# Patient Record
Sex: Female | Born: 1935 | Race: White | Hispanic: No | Smoking: Never smoker
Health system: Southern US, Community
[De-identification: ages and names within clinical notes are randomized; demographics above are authoritative.]

## PROBLEM LIST (undated history)

## (undated) DIAGNOSIS — I509 Heart failure, unspecified: Secondary | ICD-10-CM

## (undated) DIAGNOSIS — M1990 Unspecified osteoarthritis, unspecified site: Secondary | ICD-10-CM

## (undated) DIAGNOSIS — J449 Chronic obstructive pulmonary disease, unspecified: Secondary | ICD-10-CM

## (undated) DIAGNOSIS — K219 Gastro-esophageal reflux disease without esophagitis: Secondary | ICD-10-CM

## (undated) DIAGNOSIS — I639 Cerebral infarction, unspecified: Secondary | ICD-10-CM

## (undated) DIAGNOSIS — I10 Essential (primary) hypertension: Secondary | ICD-10-CM

## (undated) DIAGNOSIS — I499 Cardiac arrhythmia, unspecified: Secondary | ICD-10-CM

## (undated) DIAGNOSIS — R0602 Shortness of breath: Secondary | ICD-10-CM

## (undated) DIAGNOSIS — E785 Hyperlipidemia, unspecified: Secondary | ICD-10-CM

## (undated) DIAGNOSIS — I4891 Unspecified atrial fibrillation: Secondary | ICD-10-CM

## (undated) DIAGNOSIS — F419 Anxiety disorder, unspecified: Secondary | ICD-10-CM

## (undated) DIAGNOSIS — E039 Hypothyroidism, unspecified: Secondary | ICD-10-CM

## (undated) HISTORY — PX: TUBAL LIGATION: SHX77

## (undated) HISTORY — PX: APPENDECTOMY: SHX54

## (undated) HISTORY — DX: Cerebral infarction, unspecified: I63.9

## (undated) HISTORY — PX: TOTAL ABDOMINAL HYSTERECTOMY: SHX209

## (undated) HISTORY — PX: EYE SURGERY: SHX253

## (undated) HISTORY — PX: TONSILLECTOMY: SUR1361

## (undated) HISTORY — DX: Unspecified atrial fibrillation: I48.91

## (undated) HISTORY — DX: Hyperlipidemia, unspecified: E78.5

## (undated) HISTORY — DX: Hypothyroidism, unspecified: E03.9

## (undated) HISTORY — DX: Essential (primary) hypertension: I10

## (undated) HISTORY — PX: BREAST SURGERY: SHX581

---

## 1998-04-07 ENCOUNTER — Other Ambulatory Visit: Admission: RE | Admit: 1998-04-07 | Discharge: 1998-04-07 | Payer: Self-pay | Admitting: Obstetrics and Gynecology

## 1998-04-18 ENCOUNTER — Ambulatory Visit: Admission: RE | Admit: 1998-04-18 | Discharge: 1998-04-18 | Payer: Self-pay | Admitting: Gynecology

## 1998-07-30 ENCOUNTER — Emergency Department (HOSPITAL_COMMUNITY): Admission: EM | Admit: 1998-07-30 | Discharge: 1998-07-30 | Payer: Self-pay

## 1998-08-04 ENCOUNTER — Encounter: Payer: Self-pay | Admitting: Emergency Medicine

## 1998-08-04 ENCOUNTER — Emergency Department (HOSPITAL_COMMUNITY): Admission: EM | Admit: 1998-08-04 | Discharge: 1998-08-04 | Payer: Self-pay | Admitting: Emergency Medicine

## 1999-09-26 ENCOUNTER — Encounter: Payer: Self-pay | Admitting: Cardiology

## 1999-09-26 ENCOUNTER — Encounter: Admission: RE | Admit: 1999-09-26 | Discharge: 1999-09-26 | Payer: Self-pay | Admitting: Cardiology

## 2000-12-08 ENCOUNTER — Encounter: Admission: RE | Admit: 2000-12-08 | Discharge: 2000-12-08 | Payer: Self-pay | Admitting: Internal Medicine

## 2000-12-08 ENCOUNTER — Encounter: Payer: Self-pay | Admitting: Internal Medicine

## 2002-01-14 ENCOUNTER — Encounter: Payer: Self-pay | Admitting: Internal Medicine

## 2002-01-14 ENCOUNTER — Encounter: Admission: RE | Admit: 2002-01-14 | Discharge: 2002-01-14 | Payer: Self-pay | Admitting: Internal Medicine

## 2002-11-18 ENCOUNTER — Other Ambulatory Visit: Admission: RE | Admit: 2002-11-18 | Discharge: 2002-11-18 | Payer: Self-pay | Admitting: Dermatology

## 2003-07-29 ENCOUNTER — Encounter: Admission: RE | Admit: 2003-07-29 | Discharge: 2003-07-29 | Payer: Self-pay | Admitting: Internal Medicine

## 2004-05-17 ENCOUNTER — Inpatient Hospital Stay (HOSPITAL_COMMUNITY): Admission: EM | Admit: 2004-05-17 | Discharge: 2004-05-19 | Payer: Self-pay | Admitting: Emergency Medicine

## 2004-05-17 ENCOUNTER — Ambulatory Visit: Payer: Self-pay | Admitting: Cardiology

## 2004-05-18 ENCOUNTER — Encounter: Payer: Self-pay | Admitting: Cardiology

## 2004-05-24 ENCOUNTER — Inpatient Hospital Stay (HOSPITAL_COMMUNITY): Admission: EM | Admit: 2004-05-24 | Discharge: 2004-05-25 | Payer: Self-pay

## 2004-06-01 ENCOUNTER — Ambulatory Visit: Payer: Self-pay | Admitting: Cardiology

## 2004-07-12 ENCOUNTER — Ambulatory Visit: Payer: Self-pay | Admitting: Internal Medicine

## 2006-07-02 ENCOUNTER — Ambulatory Visit: Payer: Self-pay | Admitting: Cardiology

## 2006-07-02 ENCOUNTER — Inpatient Hospital Stay (HOSPITAL_COMMUNITY): Admission: AD | Admit: 2006-07-02 | Discharge: 2006-07-04 | Payer: Self-pay | Admitting: Internal Medicine

## 2006-07-02 ENCOUNTER — Encounter: Payer: Self-pay | Admitting: Cardiology

## 2006-07-07 ENCOUNTER — Ambulatory Visit: Payer: Self-pay | Admitting: Internal Medicine

## 2006-10-09 ENCOUNTER — Encounter: Admission: RE | Admit: 2006-10-09 | Discharge: 2006-10-09 | Payer: Self-pay | Admitting: Family Medicine

## 2006-12-25 ENCOUNTER — Emergency Department (HOSPITAL_COMMUNITY): Admission: EM | Admit: 2006-12-25 | Discharge: 2006-12-25 | Payer: Self-pay | Admitting: Emergency Medicine

## 2007-01-06 ENCOUNTER — Ambulatory Visit: Payer: Self-pay | Admitting: Pulmonary Disease

## 2007-01-15 ENCOUNTER — Encounter: Admission: RE | Admit: 2007-01-15 | Discharge: 2007-01-15 | Payer: Self-pay | Admitting: Internal Medicine

## 2007-04-13 ENCOUNTER — Ambulatory Visit: Payer: Self-pay | Admitting: Pulmonary Disease

## 2007-04-14 DIAGNOSIS — J45909 Unspecified asthma, uncomplicated: Secondary | ICD-10-CM

## 2007-04-14 DIAGNOSIS — J984 Other disorders of lung: Secondary | ICD-10-CM

## 2007-04-14 DIAGNOSIS — I10 Essential (primary) hypertension: Secondary | ICD-10-CM

## 2007-04-14 DIAGNOSIS — K7689 Other specified diseases of liver: Secondary | ICD-10-CM | POA: Insufficient documentation

## 2007-04-14 DIAGNOSIS — Z8673 Personal history of transient ischemic attack (TIA), and cerebral infarction without residual deficits: Secondary | ICD-10-CM | POA: Insufficient documentation

## 2007-04-14 DIAGNOSIS — E785 Hyperlipidemia, unspecified: Secondary | ICD-10-CM

## 2007-04-28 ENCOUNTER — Ambulatory Visit: Payer: Self-pay | Admitting: Cardiology

## 2007-04-28 ENCOUNTER — Encounter: Payer: Self-pay | Admitting: Pulmonary Disease

## 2007-05-01 ENCOUNTER — Telehealth: Payer: Self-pay | Admitting: Pulmonary Disease

## 2007-06-05 ENCOUNTER — Encounter: Payer: Self-pay | Admitting: Pulmonary Disease

## 2007-06-09 ENCOUNTER — Telehealth (INDEPENDENT_AMBULATORY_CARE_PROVIDER_SITE_OTHER): Payer: Self-pay | Admitting: *Deleted

## 2007-06-10 ENCOUNTER — Encounter: Admission: RE | Admit: 2007-06-10 | Discharge: 2007-06-10 | Payer: Self-pay | Admitting: Gastroenterology

## 2007-07-02 ENCOUNTER — Ambulatory Visit: Payer: Self-pay | Admitting: Internal Medicine

## 2007-09-14 ENCOUNTER — Encounter: Payer: Self-pay | Admitting: Pulmonary Disease

## 2007-10-05 ENCOUNTER — Ambulatory Visit: Payer: Self-pay | Admitting: Pulmonary Disease

## 2007-10-05 DIAGNOSIS — R059 Cough, unspecified: Secondary | ICD-10-CM | POA: Insufficient documentation

## 2007-10-05 DIAGNOSIS — R05 Cough: Secondary | ICD-10-CM

## 2008-02-29 ENCOUNTER — Encounter: Admission: RE | Admit: 2008-02-29 | Discharge: 2008-02-29 | Payer: Self-pay | Admitting: Internal Medicine

## 2008-03-18 ENCOUNTER — Ambulatory Visit: Payer: Self-pay | Admitting: Pulmonary Disease

## 2008-03-21 ENCOUNTER — Telehealth (INDEPENDENT_AMBULATORY_CARE_PROVIDER_SITE_OTHER): Payer: Self-pay | Admitting: *Deleted

## 2008-03-25 ENCOUNTER — Ambulatory Visit (HOSPITAL_COMMUNITY): Admission: RE | Admit: 2008-03-25 | Discharge: 2008-03-25 | Payer: Self-pay | Admitting: Pulmonary Disease

## 2008-03-25 ENCOUNTER — Ambulatory Visit: Payer: Self-pay | Admitting: Pulmonary Disease

## 2008-03-28 ENCOUNTER — Telehealth: Payer: Self-pay | Admitting: Pulmonary Disease

## 2008-03-31 ENCOUNTER — Encounter: Payer: Self-pay | Admitting: Pulmonary Disease

## 2008-04-14 ENCOUNTER — Ambulatory Visit: Payer: Self-pay | Admitting: Internal Medicine

## 2008-04-28 ENCOUNTER — Ambulatory Visit: Payer: Self-pay | Admitting: Cardiology

## 2008-06-16 ENCOUNTER — Telehealth (INDEPENDENT_AMBULATORY_CARE_PROVIDER_SITE_OTHER): Payer: Self-pay | Admitting: *Deleted

## 2009-05-24 ENCOUNTER — Telehealth: Payer: Self-pay | Admitting: Pulmonary Disease

## 2009-05-25 ENCOUNTER — Ambulatory Visit: Payer: Self-pay | Admitting: Pulmonary Disease

## 2009-05-26 ENCOUNTER — Encounter: Payer: Self-pay | Admitting: Pulmonary Disease

## 2009-06-07 LAB — CONVERTED CEMR LAB
Bilirubin Urine: NEGATIVE
Hemoglobin, Urine: NEGATIVE
Nitrite: NEGATIVE
pH: 5.5 (ref 5.0–8.0)

## 2009-07-12 ENCOUNTER — Telehealth: Payer: Self-pay | Admitting: Pulmonary Disease

## 2010-01-24 ENCOUNTER — Telehealth: Payer: Self-pay | Admitting: Internal Medicine

## 2010-01-31 ENCOUNTER — Ambulatory Visit: Payer: Self-pay | Admitting: Interventional Cardiology

## 2010-02-01 ENCOUNTER — Inpatient Hospital Stay (HOSPITAL_COMMUNITY): Admission: EM | Admit: 2010-02-01 | Discharge: 2010-02-02 | Payer: Self-pay | Admitting: Emergency Medicine

## 2010-03-29 ENCOUNTER — Ambulatory Visit: Payer: Self-pay | Admitting: Pulmonary Disease

## 2010-04-04 ENCOUNTER — Ambulatory Visit: Payer: Self-pay | Admitting: Cardiology

## 2010-04-04 DIAGNOSIS — R5383 Other fatigue: Secondary | ICD-10-CM

## 2010-04-04 DIAGNOSIS — R5381 Other malaise: Secondary | ICD-10-CM

## 2010-04-25 ENCOUNTER — Encounter: Payer: Self-pay | Admitting: Pulmonary Disease

## 2010-06-17 ENCOUNTER — Encounter: Payer: Self-pay | Admitting: Family Medicine

## 2010-06-17 ENCOUNTER — Encounter: Payer: Self-pay | Admitting: Internal Medicine

## 2010-06-26 NOTE — Assessment & Plan Note (Signed)
Summary: rov after pft ///kp   Visit Type:  Follow-up Copy to:  [facility] Primary Provider/Referring Provider:  [facility], [place],----- Dr. Waynard Edwards  CC:  Pt here for follow up after PFT. Breathing is same.  History of Present Illness: 77/F  with late onset asthma & stable pulm nodules for FU 11/08 FEV1 49%, FVC 82%  second hand smoke exposure - ? chronic bronchitis vs late onset asthma.  stable BL indeterminate nodules since 5/08. Complex cyst in liver on MRI 1/09 likley benign. bronchitis 5/09  10/09 has done well from breathing standpt , on advair once daily & spiriva episodes of flushing, tachycardia & hypertension lasting a few hrs - maxzide started by dr perini spirometry 10/09>> FEV1 34%, FEV1 % 58, FVC 54%  May 25, 2009 2:08 PM   sick visit.  [facility] CX[facility] at [facility] , given zpak x 2, mucinex 1200 two times a day & steroids. Daughter states similar symptoms with UTI. Placed on clonidine for BP surges - no clear etiology found, w/u neg for carcinoid reviewed records/CXR from [facility]- treated as COPD exacaerbation  March 29, 2010 1:49 PM  Routine OV - daughter has numerous questions, does most of talking -reviewed CXR from [facility] last yr & recent from sep '11 hosp adm. Does not walk much, BP better controlled with norvac & cozaar but still has flushing episodes. Does not want more scans or biopsies- but has numerous questions about nodules. Taking advair once daily , not needing rescue MDI much c/o wt loss - 5 lb over last yr, did not desaturate on walking PFTs - improved - FEV1 59%, FVC 79%, mild decrease DLCO 68%  Preventive Screening-Counseling & Management  Alcohol-Tobacco     Smoking Status: never  Current Medications (verified): 1)  Rythmol 150 Mg  Tabs (Propafenone Hcl) .... Take One Tab By Mouth Three Times A Day 2)  Toprol Xl 100 Mg  Tb24 (Metoprolol Succinate) .... Take One Tab By Mouth Once Daily 3)  Norvasc 10 Mg Tabs (Amlodipine Besylate) ....  Take 1 Tablet By Mouth Once A Day 4)  Spiriva Handihaler 18 Mcg  Caps (Tiotropium Bromide Monohydrate) .... Inhale Contents of 1 Capsule Once A Day 5)  Advair Diskus 250-50 Mcg/dose  Misc (Fluticasone-Salmeterol) .... Inhale One Puff Two Times A Day 6)  Restoril 15 Mg  Caps (Temazepam) .... Take One Tab By Mouth At Bedtime 7)  Bayer Low Strength 81 Mg  Tbec (Aspirin) .... Take One Tab By Mouth Once Daily 8)  Lipitor 10 Mg Tabs (Atorvastatin Calcium) .... Take 1 Tablet By Mouth Once A Day 9)  Albuterol 90 Mcg/act  Aers (Albuterol) .... Use As Directed As Needed 10)  Coumadin 4 Mg Tabs (Warfarin Sodium) .... As Directed 11)  Prilosec 20 Mg  Cpdr (Omeprazole) .... Take Once Daily. 12)  Cozaar 100 Mg Tabs (Losartan Potassium) .... Take 1/2 Tablet By Mouth Two Times A Day 13)  Alprazolam 0.25 Mg Tabs (Alprazolam) .... As Needed 14)  Omeprazole 20 Mg Cpdr (Omeprazole) .... Take 1 Tablet By Mouth Once A Day  Allergies (verified): 1)  ! Sulfa 2)  ! * Quinolone Abx  Past History:  Past Medical History: Last updated: 05/25/2009 Hypertension Atrial Fibrillation  Review of Systems       The patient complains of dyspnea on exertion.  The patient denies anorexia, fever, weight loss, weight gain, vision loss, decreased hearing, hoarseness, chest pain, syncope, peripheral edema, prolonged cough, headaches, hemoptysis, abdominal pain, melena, hematochezia,  severe indigestion/heartburn, hematuria, muscle weakness, suspicious skin lesions, difficulty walking, depression, unusual weight change, abnormal bleeding, enlarged lymph nodes, and angioedema.    Vital Signs:  Patient profile:   77 year old female Height:      61 inches Weight:      119 pounds BMI:     22.57 O2 Sat:      92 % on Room air Temp:     97.6 degrees F oral Pulse rate:   62 / minute BP sitting:   112 / 60  (left arm) Cuff size:   regular  Vitals Entered By: Zackery Barefoot CMA (March 29, 2010 1:38 PM)  O2 Flow:  Room  air  Serial Vital Signs/Assessments:  Comments: Ambulatory Pulse Oximetry  Resting; HR_60____    02 Sat__94%RA___  Lap1 (185 feet)   HR__68___   02 Sat__93%RA___ Lap2 (185 feet)   HR__70___   02 Sat__94%RA___    Lap3 (185 feet)   HR__70___   02 Sat__93%RA___  _x__Test Completed without Difficulty Zackery Barefoot CMA  March 29, 2010 2:30 PM  ___Test Stopped due to:   By: Zackery Barefoot CMA   CC: Pt here for follow up after PFT. Breathing is same Comments Medications reviewed with patient Verified contact number and pharmacy with patient Zackery Barefoot CMA  March 29, 2010 1:38 PM    Physical Exam  Additional Exam:  wt 124 dec '10   119 March 30, 2010  Gen. Pleasant, well-nourished, in no distress ENT - no lesions, no post nasal drip Neck: No JVD, no thyromegaly, no carotid bruits Lungs: no use of accessory muscles, no dullness to percussion, clear without rales or rhonchi  Cardiovascular: Rhythm regular, heart sounds  normal, no murmurs or gallops, no peripheral edema Musculoskeletal: No deformities, no cyanosis or clubbing      Impression & Recommendations:  Problem # 1:  AIRWAY OBSTRUCTION (ICD-519.8)  ct advair & spiriva since they seem to be helping Not interested in pulm rehab program  Orders: Est. Patient Level IV (16109)  Problem # 2:  PULMONARY NODULE (ICD-518.89)  stable since 2008, not visible on CXR sep '11 Does not want further imaging or biopsies.  Orders: Est. Patient Level IV (60454)  Medications Added to Medication List This Visit: 1)  Norvasc 10 Mg Tabs (Amlodipine besylate) .... Take 1 tablet by mouth once a day 2)  Coumadin 4 Mg Tabs (Warfarin sodium) .... As directed 3)  Cozaar 100 Mg Tabs (Losartan potassium) .... Take 1/2 tablet by mouth two times a day 4)  Alprazolam 0.25 Mg Tabs (Alprazolam) .... As needed 5)  Omeprazole 20 Mg Cpdr (Omeprazole) .... Take 1 tablet by mouth once a day  Patient Instructions: 1)  Copy  sent to: dr perini 2)  Please schedule a follow-up appointment in 1 year. 3)  Check ambulatory satn   Immunization History:  Influenza Immunization History:    Influenza:  historical (02/26/2010)

## 2010-06-26 NOTE — Miscellaneous (Signed)
Summary: Orders Update pft charges  Clinical Lists Changes  Orders: Added new Service order of Carbon Monoxide diffusing w/capacity (94720) - Signed Added new Service order of Lung Volumes (94240) - Signed Added new Service order of Spirometry (Pre & Post) (94060) - Signed 

## 2010-06-26 NOTE — Assessment & Plan Note (Signed)
Summary: [facility]   Visit Type:  Follow-up Primary Provider:  [facility], Dr. Waynard Edwards  CC:  HTN.  History of Present Illness: The patient presents for followup after recent hospitalization for hypertensive urgency. She has had her meds adjusted to include increase Norvasc. Since going home she has felt somewhat more fatigued. However, she is not perseverating about her blood pressure and has had no apparent slides. She has had no chest pressure, neck or arm discomfort. She has not noticed any palpitations no recent fevers endocrine she has had no PND or orthopnea. She denies any weight gain and has had some very mild.  Current Medications (verified): 1)  Rythmol 150 Mg  Tabs (Propafenone Hcl) .... Take One Tab By Mouth Three Times A Day 2)  Toprol Xl 100 Mg  Tb24 (Metoprolol Succinate) .... Take One Tab By Mouth Once Daily 3)  Norvasc 10 Mg Tabs (Amlodipine Besylate) .... Take 1 Tablet By Mouth Once A Day 4)  Spiriva Handihaler 18 Mcg  Caps (Tiotropium Bromide Monohydrate) .... Inhale Contents of 1 Capsule Once A Day 5)  Advair Diskus 250-50 Mcg/dose  Misc (Fluticasone-Salmeterol) .... Inhale One Puff Two Times A Day 6)  Restoril 15 Mg  Caps (Temazepam) .... Take One Tab By Mouth At Bedtime 7)  Bayer Low Strength 81 Mg  Tbec (Aspirin) .... Take One Tab By Mouth Once Daily 8)  Lipitor 10 Mg Tabs (Atorvastatin Calcium) .... Take 1 Tablet By Mouth Once A Day 9)  Albuterol 90 Mcg/act  Aers (Albuterol) .... Use As Directed As Needed 10)  Coumadin 4 Mg Tabs (Warfarin Sodium) .... As Directed 11)  Prilosec 20 Mg  Cpdr (Omeprazole) .... Take Once Daily. 12)  Cozaar 100 Mg Tabs (Losartan Potassium) .Marland Kitchen.. 1 By Mouth Daily 13)  Alprazolam 0.25 Mg Tabs (Alprazolam) .... As Needed  Allergies (verified): 1)  ! Sulfa 2)  ! * Quinolone Abx  Past History:  Past Medical History: Reviewed history from 05/25/2009 and no changes required. Hypertension Atrial Fibrillation  Review of Systems   As stated in the HPI and negative for all other systems.   Vital Signs:  Patient profile:   77 year old female Height:      61 inches Weight:      120 pounds Pulse rate:   61 / minute Resp:     16 per minute BP sitting:   110 / 59  (right arm)  Vitals Entered By: Marrion Coy, CNA (April 04, 2010 1:14 PM)  Physical Exam  General:  Well developed, well nourished, in no acute distress. Head:  normocephalic and atraumatic Neck:  Neck supple, no JVD. No masses, thyromegaly or abnormal cervical nodes. Chest Wall:  no deformities or breast masses noted Lungs:  Clear bilaterally to auscultation and percussion. Heart:  Non-displaced PMI, chest non-tender; regular rate and rhythm, S1, S2 without murmurs, rubs or gallops. Carotid upstroke normal, no bruit. Normal abdominal aortic size, no bruits. Femorals normal pulses, no bruits. Pedals normal pulses. No edema, no varicosities. Abdomen:  Bowel sounds positive; abdomen soft and non-tender without masses, organomegaly, or hernias noted. No hepatosplenomegaly. Msk:  Back normal, normal gait. Muscle strength and tone normal. Extremities:  No clubbing or cyanosis. Neurologic:  Alert and oriented x 3. Skin:  Intact without lesions or rashes. Cervical Nodes:  no significant adenopathy Inguinal Nodes:  no significant adenopathy Psych:  Normal affect.   EKG  Procedure date:  04/04/2010  Findings:      Sinus rhythm, rate 62,  left axis deviation, poor anterior R-wave progression, RSR prominent V1 and V2  Impression & Recommendations:  Problem # 1:  HYPERTENSION (ICD-401.9) I spent a long time discussing treatment for any hypertensive spikes.  For now she will continue the meds as listed (greater than 1/2 hour with this appt).  Problem # 2:  Hx of ATRIAL FIBRILLATION (ICD-427.31) She had add no new paroxysms and will continue meds as listed.  Problem # 3:  FATIGUE (ICD-780.79) I discussed improved sleep hygiene which she will discuss  with her primary physician as she may need something other Restoril. We also discussed changing the timing of her beta blocker to try to treat this problem.  Patient Instructions: 1)  Your physician recommends that you schedule a follow-up appointment in: 3 months with Dr Antoine Poche in [place] 2)  Your physician recommends that you continue on your current medications as directed. Please refer to the Current Medication list given to you today.

## 2010-06-26 NOTE — Progress Notes (Signed)
Summary: samples  Phone Note Call from Patient Call back at [phone]   Caller: Daughter-Connie Webster Call For: alva Reason for Call: Talk to Nurse Summary of Call: want to know if she can get samples of spiriva and Advair? Initial call taken by: Eugene Gavia,  July 12, 2009 4:05 PM  Follow-up for Phone Call        Samples left up front, [facility] because pt needs to sched appt. Vernie Murders  July 12, 2009 4:12 PM  talked to the patient let her know we have samples for her and that she needs to make an appt with our office she said she would have her daughter call and make the appt Follow-up by: Lacinda Axon,  July 13, 2009 8:53 AM

## 2010-06-26 NOTE — Progress Notes (Signed)
Summary: c/o b/p issues  Phone Note Call from Patient Call back at Home Phone [phone]   Caller: Daughter [name]  [phone] Reason for Call: Talk to Nurse Action Taken: Patient advised to call 911 Summary of Call: per dtr calling regarding b/p issues out of control. pt pcp is on vacation .  Initial call taken by: Lorne Skeens,  January 24, 2010 11:17 AM  Follow-up for Phone Call        [facility] for pt's daughter to call me back Dennis Bast, RN, BSN  January 24, 2010 4:47 PM  Additional Follow-up for Phone Call Additional follow up Details #1::        Pt daughter calling back regarding B/P issues and heart flutter call back [phone] or [phone] [name]  January 30, 2010 10:18 AM spoke with daughter and will get her mom in to see Dr Ladona Ridgel the week after his [facility] Dennis Bast, RN, BSN  February 05, 2010 6:01 PM Will sset pt up to see Dr Ladona Ridgel on 9/28/11at 3:15pm.   Dennis Bast, RN, BSN  February 08, 2010 10:09 AM    Additional Follow-up for Phone Call Additional follow up Details #2::    called and offered daughter apt with Dr Ladona Ridgel for 02/21/10.  She says her Mom has been in the hospital and she didn't need apt any longer. Dennis Bast, RN, BSN  February 08, 2010 4:51 PM

## 2010-07-04 ENCOUNTER — Encounter: Payer: Self-pay | Admitting: Cardiology

## 2010-07-04 ENCOUNTER — Ambulatory Visit (INDEPENDENT_AMBULATORY_CARE_PROVIDER_SITE_OTHER): Payer: Medicare Other | Admitting: Cardiology

## 2010-07-04 DIAGNOSIS — I4891 Unspecified atrial fibrillation: Secondary | ICD-10-CM

## 2010-07-12 NOTE — Assessment & Plan Note (Signed)
Summary: 3 months 414.01 427.31 pfh,rn/tt   Visit Type:  Follow-up Primary Provider:  [facility], Dr. Waynard Edwards  CC:  Atrial Fibrillation.  History of Present Illness: The patient presents for followup of atrial fibrillation.  Since I last saw her she has been diagnosed with hypothyroidism and started on synthorid.  She is still fatigued but again she just started this medication. She denies any chest pressure, neck or arm discomfort. She has had no palpitations, presyncope or syncope. She denies any new shortness of breath, PND or orthopnea. No her blood pressure seems to be better controlled and she said no sensation that he was going up and on a few occasions when she has taken her pressure it has been well controlled.  Current Medications (verified): 1)  Rythmol 150 Mg  Tabs (Propafenone Hcl) .... Take One Tab By Mouth Three Times A Day 2)  Toprol Xl 100 Mg  Tb24 (Metoprolol Succinate) .... Take One Tab By Mouth Once Daily 3)  Norvasc 10 Mg Tabs (Amlodipine Besylate) .... Take 1 Tablet By Mouth Once A Day 4)  Spiriva Handihaler 18 Mcg  Caps (Tiotropium Bromide Monohydrate) .... Inhale Contents of 1 Capsule Once A Day 5)  Advair Diskus 250-50 Mcg/dose  Misc (Fluticasone-Salmeterol) .... Inhale One Puff Two Times A Day 6)  Restoril 15 Mg  Caps (Temazepam) .... Take One Tab By Mouth At Bedtime 7)  Bayer Low Strength 81 Mg  Tbec (Aspirin) .... Take One Tab By Mouth Once Daily 8)  Lipitor 10 Mg Tabs (Atorvastatin Calcium) .... Take 1 Tablet By Mouth Once A Day 9)  Albuterol 90 Mcg/act  Aers (Albuterol) .... Use As Directed As Needed 10)  Coumadin 4 Mg Tabs (Warfarin Sodium) .... As Directed 11)  Prilosec 20 Mg  Cpdr (Omeprazole) .... Take Once Daily. 12)  Alprazolam 0.25 Mg Tabs (Alprazolam) .... As Needed 13)  Clonidine Hcl 0.1 Mg Tabs (Clonidine Hcl) .... As Needed 14)  Synthroid 50 Mcg Tabs (Levothyroxine Sodium) .Marland Kitchen.. 1 By Mouth Daily  Allergies (verified): 1)  ! Sulfa 2)  ! * Quinolone  Abx  Past History:  Past Medical History: Hypertension Atrial Fibrillation Dyslipidemia Hypothyroidism  Review of Systems       As stated in the HPI and negative for all other systems.   Vital Signs:  Patient profile:   77 year old female Height:      61 inches Weight:      121 pounds BMI:     22.95 Pulse rate:   61 / minute Resp:     16 per minute BP sitting:   106 / 62  (right arm)  Vitals Entered By: Marrion Coy, CNA (July 04, 2010 1:06 PM)  Physical Exam  General:  Well developed, well nourished, in no acute distress. Head:  normocephalic and atraumatic Neck:  Neck supple, no JVD. No masses, thyromegaly or abnormal cervical nodes. Lungs:  Clear bilaterally to auscultation and percussion. Heart:  Non-displaced PMI, chest non-tender; regular rate and rhythm, S1, S2 without murmurs, rubs or gallops. Carotid upstroke normal, no bruit. Normal abdominal aortic size, no bruits. Femorals normal pulses, no bruits. Pedals normal pulses. No edema, no varicosities. Abdomen:  Bowel sounds positive; abdomen soft and non-tender without masses, organomegaly, or hernias noted. No hepatosplenomegaly. Msk:  Back normal, normal gait. Muscle strength and tone normal. Extremities:  No clubbing or cyanosis. Neurologic:  Alert and oriented x 3. Skin:  Intact without lesions or rashes. Cervical Nodes:  no significant adenopathy Psych:  Normal affect.    Impression & Recommendations:  Problem # 1:  Hx of ATRIAL FIBRILLATION (ICD-427.31) The patient has had no symptomatic paroxysms. No change in therapy is indicated.  Problem # 2:  FATIGUE (ICD-780.79) Hopefully this will improve with thyroid replacement. No further cardiovascular testing is suggested.  Problem # 3:  HYPERTENSION (ICD-401.9) Her blood pressure seems to be well-controlled with addition of amlodipine. She will continue on this medication.  Patient Instructions: 1)  Your physician recommends that you continue on  your current medications as directed. Please refer to the Current Medication list given to you today. 2)  Your physician wants you to follow-up in:  12 months with Dr Hoyle Barr will receive a reminder letter in the mail two months in advance. If you don't receive a letter, please call our office to schedule the follow-up appointment.

## 2010-08-09 LAB — DIFFERENTIAL
Basophils Absolute: 0.1 10*3/uL (ref 0.0–0.1)
Eosinophils Absolute: 0.2 10*3/uL (ref 0.0–0.7)
Eosinophils Relative: 3 % (ref 0–5)
Lymphocytes Relative: 17 % (ref 12–46)
Lymphs Abs: 1.1 10*3/uL (ref 0.7–4.0)

## 2010-08-09 LAB — CK TOTAL AND CKMB (NOT AT ARMC): Relative Index: INVALID (ref 0.0–2.5)

## 2010-08-09 LAB — CBC
HCT: 41.7 % (ref 36.0–46.0)
Hemoglobin: 13.8 g/dL (ref 12.0–15.0)
MCHC: 33.1 g/dL (ref 30.0–36.0)
MCV: 90.1 fL (ref 78.0–100.0)
Platelets: 178 10*3/uL (ref 150–400)
RDW: 13.9 % (ref 11.5–15.5)
WBC: 6.3 10*3/uL (ref 4.0–10.5)

## 2010-08-09 LAB — CARDIAC PANEL(CRET KIN+CKTOT+MB+TROPI): CK, MB: 2.8 ng/mL (ref 0.3–4.0)

## 2010-08-09 LAB — BASIC METABOLIC PANEL
CO2: 30 mEq/L (ref 19–32)
Chloride: 105 mEq/L (ref 96–112)
GFR calc Af Amer: >60 mL/min (ref >60)
Sodium: 140 mEq/L (ref 135–145)

## 2010-08-09 LAB — POCT I-STAT, CHEM 8
BUN: 12 mg/dL (ref 6–23)
Calcium, Ion: 0.98 mmol/L — ABNORMAL LOW (ref 1.12–1.32)
Chloride: 109 mEq/L (ref 96–112)
Glucose, Bld: 130 mg/dL — ABNORMAL HIGH (ref 70–99)
Hemoglobin: 15.3 g/dL — ABNORMAL HIGH (ref 12.0–15.0)
Potassium: 3.7 mEq/L (ref 3.5–5.1)

## 2010-08-09 LAB — TROPONIN I: Troponin I: 0.04 ng/mL (ref 0.00–0.06)

## 2010-08-09 LAB — PROTIME-INR
INR: 1.92 — ABNORMAL HIGH (ref 0.00–1.49)
INR: 2.2 — ABNORMAL HIGH (ref 0.00–1.49)

## 2010-08-09 LAB — POCT CARDIAC MARKERS
CKMB, poc: 1.2 ng/mL (ref 1.0–8.0)
Myoglobin, poc: 56.8 ng/mL (ref 12–200)
Troponin i, poc: <0.05 ng/mL (ref 0.00–0.09)

## 2010-10-09 NOTE — Letter (Signed)
January 06, 2007    Mark A. Waynard Edwards, M.D.  [address]  [place], [place] [address]   RE:  GALAXY, BORDEN  MRN:  [id number]  /  DOB:  [birth year]   Dear Dr. Waynard Edwards:   Thank you for this referral. As you are aware, Julie Rich is a pleasant 77-  year-old Caucasian woman, never smoker, who presents for an evaluation  of hemoptysis. She reports an episode three months ago where she had  blood streaked sputum. A CT of the chest was performed due to the  finding of a small left lung nodule on chest x-ray. Multiple small  bilateral pulmonary nodules were noted. No pathologically enlarged  mediastinal lymph nodes were noted. About two weeks ago, the patient had  another episode of hemoptysis for which she went to the ER at [facility]. A CT scan was repeated and this again showed the numerous  bilateral lung nodules. The largest measured 5.3 millimeters in the left  upper lobe. No obvious reason for the patient's hemoptysis was noted.  Several thyroid nodules were also noted. The liver showed numerous  cystic lesions which had been noted on the prior CT. There was one  complicated appearing lesion in the anterior segment of the right  hepatic lobe measuring 3.1 x 3.7 x 4.7 sonometers.   The patient denies fever, URI symptoms, weight loss, or loss of  appetite.   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   PAST SURGICAL HISTORY:  Hysterectomy in 1967, lumpectomy in 1982,  appendectomy in 1946.   ALLERGIES:  SULFA and CIPRO.   CURRENT MEDICATIONS:  1. Rythmol 150 mg t.i.d.  2. Toprol 100 mg daily.  3. Coumadin as directed.  4. Norvasc 3.75 mg daily.  5. Spiriva HandiHaler daily.  6. Advair discus 250/50 b.i.d.  7. Zestril 15 mg 1/2 tablet nightly.  8. Aspirin 81 mg daily.  9. Protonix 40 mg daily.  10.Lipitor 20 mg daily.  11.Albuterol MDI p.r.n. as  needed.   SOCIAL HISTORY:  Never smoker. She is divorced and lives alone. She is a  retired Catering manager.   FAMILY HISTORY:  Mother had emphysema.   REVIEW OF SYSTEMS:  Reports irregular heartbeat. Her cough is now clear  white. She reports occasional dyspnea with activity.   PHYSICAL EXAMINATION:  VITAL SIGNS:  Weight 133 pounds, temperature  97.7, blood pressure 108/70, heart rate 69, oxygen saturation 94% on  room air.  HEENT:  No post-nasal drip.  NECK:  Supple, no JVD or lymphadenopathy.  CARDIOVASCULAR:  S1 and S2 normal.  CHEST:  Clear to auscultation.  ABDOMEN:  Soft and nontender.  NEUROLOGIC:  Non-focal.  EXTREMITIES:  No edema.   IMPRESSION:  1. Multiple pulmonary nodules.  2. Thyroid nodules.  3. Cystic liver lesions.  4. Hemoptysis without a clear source in the setting of therapeutic      INR.  5. Atrial fibrillation on Coumadin.   The presentation is suspicious for metastatic malignancy. The primary  source is unclear. She seems to have had a mammogram last year which was  reportedly normal, but she has never had a colonoscopy.   RECOMMENDATIONS:  1. I would suggest that we start with a colonoscopy in the view of her      liver lesions. If this is normal, we may have to consider looking      at the liver lesions  some more to differentiate cystic lesions from      metastases. At least one of those lesions on the CT scan from      09/2006 appeared to be a complex cyst and after an appropriate      imaging study (? MRI) could possibly be biopsied by interventional      radiology.  2. F/u mammogram should also be performed and correlated with her      prior one.  3. The pulmonary lesions appear to be too small to be biopsied      currently, but can be watched over the next 4 to 6 months. I have      asked her to call me if her hemoptysis recurs and I would proceed      with an inspection bronchoscopy after holding her Coumadin in that      case.     Sincerely,      Julie Milch, MD  Electronically Signed    RVA/MedQ  DD: 01/06/2007  DT: 01/08/2007  Job #: [id number]

## 2010-10-09 NOTE — Assessment & Plan Note (Signed)
[facility]                            CARDIOLOGY OFFICE NOTE   Julie Rich, Julie Rich                         MRN:          [id number]  DATE:04/28/2008                            DOB:          01-04-1922    PRIMARY CARE PHYSICIAN:  Julie [facility].   CLINICAL HISTORY:  [name] is 77 years old and was seen today for an  unscheduled visit at the request of Julie Rich, nurse practitioner at  [facility].  Julie Rich is followed by Julie Rich  for paroxysmal atrial fibrillation for which she is taking flecainide  and Toprol.  This morning, she got up and she felt somewhat weak and  took her blood pressure was 100, and then she took it a little bit later  was 106.  She was seen in [facility] and  they faxed an ECG to [place] this morning which demonstrated short runs of  atrial tachycardia.  I talked to Julie Rich and arranged for her to  come over here for evaluation.   She said she had some mild brief chest tightness with her symptom this  morning.  She was not aware of the palpitations.  In the past, she has  had symptoms of fatigue and Julie Rich has titrated her beta-blocker up  and down related to this.  She is currently on 50 mg twice a day.   PAST MEDICAL HISTORY:  Significant for hypertension, chronic pulmonary  nodules, and Coumadin therapy.   CURRENT MEDICATIONS:  1. Rythmol 150 mg t.i.d.  2. Coumadin.  3. Toprol 100 mg one-half tablet b.i.d.  4. Norvasc 5 mg daily.  5. Spiriva.  6. Restoril.  7. Aspirin.  8. Advair.  9. Prilosec.  10.Lipitor.   PHYSICAL EXAMINATION:  VITAL SIGNS:  The blood pressure is 146/70, the  pulse 69 and regular.  NECK:  There was no venous tension.  The carotid pulses were full  without bruits.  CHEST:  Clear.  CARDIAC:  Rhythm was regular.  I could hear no murmurs or gallops.  ABDOMEN:  Soft with normal bowel sounds.  EXTREMITIES:   Peripheral pulses were full with no peripheral edema.   Electrocardiogram here today showed sinus rhythm with one PVC.  There  was left axis deviation and minor nonspecific ST-T changes.   IMPRESSION:  1. Labile hypertension.  2. Short episodes of supraventricular tachycardia.  3. History of paroxysmal atrial fibrillation controlled on Rythmol.  4. Hypertension.  5. Chronic Coumadin therapy.  6. Solitary pulmonary nodule.   RECOMMENDATIONS:  Julie Rich blood pressure was somewhat labile this  morning, but it is stabilized out, and is in a reasonable range now.  She had episodes of SVT, although she was not specifically symptomatic  with the palpitations.  Normally, I would go up on her beta blocker, but  she has had symptoms of fatigue from this in the past and Julie Rich has  been careful not to push her dose too high.  After discussing the  situation with Julie Rich, we decided to  leave her medications the same.  She has an appointment to see Julie Rich back in February and we will  keep that appointment or have her seen sooner if she has developed  recurrent problems.     Bruce Elvera Lennox Juanda Chance, MD, [facility]  Electronically Signed    BRB/MedQ  DD: 04/28/2008  DT: 04/29/2008  Job #: [id number]   cc:   Julie Rich

## 2010-10-09 NOTE — Assessment & Plan Note (Signed)
[facility]                         ELECTROPHYSIOLOGY OFFICE NOTE   Julie, Rich                         MRN:          [id number]  DATE:04/14/2008                            DOB:          Feb 27, 1922    Julie Rich returns today for followup.  She is a pleasant elderly woman  with a history of multiple strokes.  She has a history of paroxysmal  AFib which has been nicely controlled on Rythmol therapy.  She has  hypertension, on chronic Coumadin therapy.  I last saw her back in  February.  Since then, she has been stable, but she notes today that her  blood pressure has been decreased in the morning and somewhat increased  in the evening.  She had seen Dr. Waynard Edwards and gotten a prescription for  Maxzide in addition to her other medicines, but she has been reluctant  to take this.  When I saw her back in February, because of fatigue, I  had recommend that we decrease her dose of Toprol from 100 to 50 mg a  day, but she was afraid that she might have more problems and decided  not to take my advice.  She continues on Toprol.  She takes it in the  evening.  She is on Norvasc, which she takes in the morning.  She is  also on Rythmol 3 times daily.  With Rythmol, she has had nice control  of her AFib.  She had a host of other questions today.  First one was  that of constipation.  She notes that she has constipation.  Her  daughter who is with her thinks this may be coming from her Lipitor  therapy.  The patient is on Lipitor 10 mg daily.   PHYSICAL EXAMINATION:  GENERAL:  She is a pleasant, well-appearing  elderly woman in no distress.  VITAL SIGNS:  Blood pressure is 130/66, the pulse is 61 and regular, and  respirations are 18.  Weight is 130 pounds.  NECK:  No jugular vein distention.  LUNGS:  Clear bilaterally to auscultation.  No wheezes, rales, or  rhonchi are present.  CARDIOVASCULAR:  Regular rate and rhythm.  Normal S1 and S2.  There are  no  murmurs, rubs, or gallops present.  ABDOMEN:  Soft and nontender.  EXTREMITIES:  No edema.   EKG demonstrates sinus rhythm with incomplete right bundle-branch block  and first-degree AV block.   IMPRESSION:  1. Paroxysmal atrial fibrillation.  2. Hypertension.  3. Dyslipidemia.   DISCUSSION:  Overall, Julie Rich is stable.  I have asked that she take  her Toprol at half a dose, taking 50 mg in the morning and 50 mg in the  evening.  Hopefully, this will help with her blood pressure control in  the evening.  With regard to her constipation, I have asked that she  stop her Lipitor for 2 weeks to see if her constipation goes away.  If  it does, then we would  consider switching her to a different statin drug.  I will plan to see  the patient back  in clinic in February 2010.     Julie Canning. Ladona Ridgel, MD  Electronically Signed    GWT/MedQ  DD: 04/14/2008  DT: 04/15/2008  Job #: [id number]

## 2010-10-09 NOTE — Assessment & Plan Note (Signed)
[facility]                         ELECTROPHYSIOLOGY OFFICE NOTE   Julie Rich, Julie Rich                         MRN:          [id number]  DATE:07/02/2007                            DOB:          05-Jul-1921    HISTORY:  Julie Rich returns today for followup.  She is a very pleasant  77 year old woman with a history of paroxysmal AFib, hypertension and  recent finding of solitary lung nodules.  She has been maintained in  sinus rhythm fairly nicely on Rythmol, though she does occasionally have  palpitations, also has continued to have problems and issues with  elevation of her blood pressure.  She has had no syncope.  She has not  been hospitalized for her atrial arrhythmias.   PHYSICAL EXAMINATION:  GENERAL:  She is a pleasant elderly woman who  looks somewhat younger than her stated age.  VITAL SIGNS:  Blood pressure was 128/72, pulse 67 and regular,  respirations were 18, weight 130 pounds.  NECK:  Revealed no jugular venous distention.  LUNGS:  Clear bilaterally to auscultation.  No wheezes, rales or rhonchi  are present.  CARDIOVASCULAR:  Regular rate and rhythm.  Normal S1-S2.  There was a  split S2.  PMI was not enlarged or laterally placed  ABDOMINAL:  Soft, nontender.  EXTREMITIES:  Demonstrated no cyanosis, clubbing or edema.   DIAGNOSTICS:  EKG demonstrates sinus rhythm with first-degree AV block  and incomplete right bundle branch block.  The QRS duration was 100  milliseconds.   IMPRESSION:  1. Paroxysmal atrial fibrillation well-controlled on Rythmol.  2. Hypertension.  3. Chronic Coumadin therapy.  4. Solitary pulmonary nodules.   DISCUSSION:  Overall, Julie Rich is stable.  She does complain of some  fatigue.  I have asked that she decrease her Toprol from 100 to 50 mg a  day to see if her fatigue improves.   FOLLOW UP:  I will see her back in the office in 6 months, sooner should  she have worsening symptoms.     Doylene Canning.  Ladona Ridgel, MD  Electronically Signed    GWT/MedQ  DD: 07/02/2007  DT: 07/03/2007  Job #: [id number]   cc:   Loraine Leriche A. Perini, M.D.

## 2010-10-09 NOTE — Assessment & Plan Note (Signed)
Julie Rich                             Julie Rich, Julie Rich                         MRN:          [id number]  DATE:04/13/2007                            DOB:          03/18/1922    Julie Rich is an 77 year old who was referred for an evaluation of  multiple subcentimeter Julie nodules, the largest measuring 5 mm in  the left upper lobe.  She has had no recurrence of her hemoptysis since  December 01, 2006.  She has been told that she has chronic obstructive  Julie disease; however, she is a lifelong never smoker.  She has  been exposed to second-hand smoke from her husband.  She reports dyspnea  on exertion.  Her daughter wonders if she is on too much medication for  chronic obstructive Julie disease and for her heart.  Spirometry in  the office today showed an FEV1 FVC ratio of 47.  FEV1 of 59%, and FVC  of 82%.  There was no significant bronchodilator response.   She has seen Dr. Laural Benes who felt that a colonoscopy was not necessary.  Of note, she has cystic lesions in her liver noted on prior CT scans,  and there was one complicated-appearing lesion in the anterior segment  of the right hepatic lobe, measuring 3.1 x 3.7 x 4.7 cm.   CURRENT MEDICATIONS:  1. Rythmol 150 mg t.i.d.  2. Coumadin as directed.  3. Toprol XL 100 mg daily.  4. Norvasc 3.75 mg daily.  5. Spiriva inhaler daily.  6. Advair Diskus 250/50 b.i.d.  7. Restoril 15 mg nightly.  8. Aspirin 81 mg daily.  9. Lipitor 20 mg daily.  10.Albuterol nebs as needed.   PHYSICAL EXAMINATION:  Weight 138 pounds, blood pressure 130/80, heart  rate 69, oxygen saturation 96% on room air.  HEENT:  Normal.  CARDIOVASCULAR:  S1, S2, normal, no murmur.  CHEST:  Clear to auscultation.  Decreased breath sounds both bases.   Mammogram in August 2008 normal.   IMPRESSION:  1. Airway obstruction without significant bronchodilator response.  I      doubt onset of  asthma at this age.  The cause of her airway      obstruction remains unexplained.  As such I have asked her to      decrease her Advair to once a day and continue her Spiriva.  If she      does not have further episodes of bronchospasm, I would try to take      her off the Advair over the next few months.  2. Complex liver cyst.  I will defer the workup of this, if any, to      her gastrointestinal physician.  3. Multiple Julie nodules.  Followup CT chest was scheduled in      about 2 weeks' time.     Oretha Milch, MD  Electronically Signed    RVA/MedQ  DD: 04/13/2007  DT: 04/14/2007  Job #: [id number]   cc:   Loraine Leriche A. Perini, M.D.  Danise Edge, M.D.

## 2010-10-12 NOTE — Assessment & Plan Note (Signed)
[facility]                         ELECTROPHYSIOLOGY OFFICE NOTE   JESSENYA, BERDAN                         MRN:          [id number]  DATE:07/07/2006                            DOB:          December 24, 1921    Julie Rich returns today for followup after a long absence from our EP  clinic.  She is a very pleasant 77 year old woman with a history of  paroxysmal atrial arrhythmias who has been maintained on Rythmol therapy  now for the last several years.  The patient states that she has fairly  infrequent episodes of palpitations which typically last for up to 30  minutes at a time, typically not longer than this.  She has had no  syncope.  She was recently admitted to the hospital with what was  thought to be due to occipital stroke.  This was despite Coumadin  therapy.  The patient denies chest pain or shortness of breath.   EXAMINATION:  GENERAL:  She is a pleasant, well-appearing, elderly woman  in no distress.  VITAL SIGNS:  The blood pressure was 126/80, the pulse 66 and regular,  the respirations were 18, the weight was 137 pounds.  NECK:  Revealed no jugular venous distention.  LUNGS:  Clear bilaterally to auscultation.  There were no wheezes, rales  or rhonchi.  CARDIOVASCULAR:  Revealed a regular rate and rhythm with normal S1 and a  split S2.  EXTREMITIES:  Demonstrated no cyanosis, clubbing or edema.  The pulses  were 2+ and symmetric.   EKG demonstrates sinus rhythm with frequent PACs.   IMPRESSION:  1. Symptomatic paroxysmal atrial fibrillation.  2. Hypertension.  3. Chronic Coumadin therapy.  4. Chronic Rythmol therapy.   DISCUSSION:  Overall, [name] is stable and her heart is maintaining  sinus rhythm though she does have frequent PACs.  I have asked that she  continue her present medical therapy and we will see her back in several  months.     Doylene Canning. Ladona Ridgel, MD  Electronically Signed    GWT/MedQ  DD: 07/07/2006  DT:  07/07/2006  Job #: [id number]   cc:   Loraine Leriche A. Perini, M.D.

## 2010-10-12 NOTE — Discharge Summary (Signed)
Julie Rich, SINGLE                  ACCOUNT NO.:  [id number]   MEDICAL RECORD NO.:  [id number]          PATIENT TYPE:  INP   LOCATION:  2031                         FACILITY:  [facility]   PHYSICIAN:  Joellyn Rued, P.A. [facility] DATE OF BIRTH:  02-15-1922   DATE OF ADMISSION:  05/17/2004  DATE OF DISCHARGE:  05/19/2004                           DISCHARGE SUMMARY - REFERRING   DISCHARGE PHYSICIAN:  Shawnie Pons, M.D. [facility]   HISTORY OF PRESENT ILLNESS:  The patient is an 77 year old white female who  appears younger than her stated age.  She describes occasional palpitations  over the preceding several years, and was diagnosed approximately four years  ago in [place] with paroxysmal atrial fibrillation, and is maintained on  chronic Coumadin, which is followed by Dr. Loraine Leriche A. Perini.  She presents to  the emergency room with palpitations.  Her episodes usually last four hours  in duration; however, the episode she is currently having began at 4 o'clock  on the day prior to admission.  She has mild dyspnea with her palpitations,  but denies any associated chest discomfort, nausea, vomiting, diaphoresis,  or pre-syncope.  Sometimes she takes an extra Toprol for her palpitations;  however, she does not take any additional medications.  Due to the  persistence, she presented to [facility] for an  evaluation.  They referred her to [facility] for further evaluation.   PAST MEDICAL/SURGICAL HISTORY:  1.  Notable for hypertension.  2.  Dyslipidemia.  3.  Status post a hysterectomy.  4.  Status post a benign breast tumor.   LABORATORY DATA:  A chest x-ray on admission showed no active disease.  Admission H&H was 15.4 and 45.3 respectively, normal indices, platelets 222,  WBC's 5.3.  PT 22.1, INR 2.5, PTT 44.  Sodium 145, potassium 3.9, BUN 15,  creatinine 1.0, normal liver function tests.  Magnesium 2.2, TSH 2.735.  Electrocardiogram from Dr.  Suella Grove. Moore's office showed a sinus  tachycardia; however, on further evaluation appeared to be atrial flutter.  It was somewhat atypical.   HOSPITAL COURSE:  The patient was admitted to the unit 2000.  She was  continued on her home medications and started on some IV Cardizem.  This  slowed her ventricular response.  Overnight she did not have any further  complaints.  It appeared that she was in atrial flutter.  Her rate remained  between the 90's and 110, unassociated with any symptoms.  An echocardiogram  was obtained, which showed a normal ejection fraction and trivial AI.  Given  her therapeutic INR, a DC cardioversion was performed on May 18, 2004,  restoring her to sinus bradycardia and normal sinus rhythm.  Overnight she did not have any further problems.  She remained in sinus  bradycardia and in normal sinus rhythm, with the addition of Cardizem, in  addition to her home medications.  She was slightly hypotensive; however,  asymptomatic.  Her Cardizem had been held, secondary to her sinus  bradycardia. Dr. Arturo Morton. Stuckey, after reviewing, felt that the patient  could be discharged home.   DISCHARGE DIAGNOSES:  1.  Paroxysmal atrial fibrillation, status post direct current cardioversion      on May 18, 2004.  2.  Sinus bradycardia.  3.  Hypertension, in association with paroxysmal atrial fibrillation.  4.  History as previously.   DISPOSITION:  The patient is discharged home.   DISCHARGE MEDICATIONS:  1.  Rhythmol 150 mg t.i.d.  2.  Coumadin 5 mg, alternating with 2.5 mg.  3.  Toprol XL 50 mg daily.  4.  Xanax.  5.  Serevent, Flovent, therapy as previously.   DISCHARGE INSTRUCTIONS:  1.  She was instructed not to take her Norvasc at this time.  2.  She was asked to keep a blood pressure diary, and bring her blood      pressure diary and all medications to all appointments.  3.  She is to maintain a low-salt, low-fat diet.  4.  She will need a  pro-time with Dr. Loraine Leriche A. Perini in the next one to two      weeks, to ensure that she remains therapeutic with a target INR between      2 and 3.  5.  Dr. Maisie Fus C. Wall's office will call her with a follow-up appointment      in regards to her paroxysmal atrial fibrillation.  6.  Follow up with Dr. Waynard Edwards as previously scheduled.       EW/MEDQ  D:  05/19/2004  T:  05/21/2004  Job:  [id number]   cc:   Loraine Leriche A. Waynard Edwards, M.D.  [address]  [place]  [place] [address]  Fax: [phone]   Ernestina Penna, M.D.  [address] [place]  [place] [address]  Fax: [phone]

## 2010-10-12 NOTE — Op Note (Signed)
[name], [name]                  ACCOUNT NO.:  [id number]   MEDICAL RECORD NO.:  [id number]          PATIENT TYPE:  INP   LOCATION:  2031                         FACILITY:  [facility]   PHYSICIAN:  Charlies Constable, M.D. [facility] DATE OF BIRTH:  [birth year]   DATE OF PROCEDURE:  05/18/2004  DATE OF DISCHARGE:                                 OPERATIVE REPORT   PROCEDURE:  DC cardioversion.   [name] was admitted to the hospital with paroxysmal atrial fibrillation  while taking Rythmol 150 mg t.i.d.  She had been on therapeutic Coumadin,  and decision was made to proceed with DC cardioversion.   The procedure was performed with anesthesia assistance.  She was given 120  mg of Pentothal IV.  She was converted with 120 watt seconds using AP  paddles and the biphasic defibrillator on the first shock with conversion to  normal sinus rhythm.  There were no immediate complications.       BB/MEDQ  D:  05/18/2004  T:  05/18/2004  Job:  [id number]

## 2010-10-12 NOTE — Discharge Summary (Signed)
NAMESANAI, FRICK                  ACCOUNT NO.:  [id number]   MEDICAL RECORD NO.:  [id number]          PATIENT TYPE:  INP   LOCATION:  2006                         FACILITY:  [facility]   PHYSICIAN:  Pricilla Riffle, M.D.    DATE OF BIRTH:  March 04, 1922   DATE OF ADMISSION:  DATE OF DISCHARGE:  05/25/2004                                 DISCHARGE SUMMARY   DISCHARGE DIAGNOSES:  1.  Admission with palpitations, finding of recurrent atrial flutter.  2.  Recent direct current cardioversion December 24, converting atrial      flutter/fibrillation to sinus rhythm.  3.  Converting to sinus rhythm this admission after rate control efforts      with IV Cardizem, combined with her traditional medications of Rythmol      and Toprol.  4.  Subtherapeutic Coumadin this admission, with intravenous heparin      coverage.  5.  Patient was ruled out for myocardial infarction with serial cardiac      enzymes.   SECONDARY DIAGNOSES:  1.  History of paroxysmal atrial fibrillation on chronic Coumadin therapy.  2.  Status post direct current cardioversion December 24, converting atrial      flutter/fibrillation to sinus rhythm.  3.  Dyslipidemia.  4.  Rythmol therapy, uses the values of use dependence with Rythmol.   PROCEDURE:  None this particular admission.   CONSULTATIONS:  None this admission.   CONDITION ON DISCHARGE:  Patient in sinus rhythm.  Patient is alert and  oriented in no acute distress.   She has been maintained on intravenous heparin while here, on subtherapeutic  Coumadin.  Her Coumadin has been restarted and she has been given a dose of  Lovenox subcutaneously prior to her discharge. She was having no complaint  of palpitations or shortness of breath, chest pain.  She did not have  syncope or shortness of breath as inciting symptoms leading to this  hospitalization.   DISCHARGE MEDICATIONS:  1.  Coumadin 4 mg in the morning Saturday, December 31 and then resuming 4      mg at night,  except for 2 mg Tuesday and Friday.  2.  Toprol  XL 50 mg daily.  She was asked to take an extra Toprol  XL 50 mg      when she feels her heart start racing.  3.  Xanax as needed.  4.  Serevent and Flovent inhalers as prior to this admission.  5.  Rythmol 150 mg 3 times daily.   ACTIVITY:  No restrictions.   DISCHARGE DIET:  Low sodium, low cholesterol diet.   WOUND CARE:  Not applicable.   FOLLOW UP:  She follows up with Dr. Valera Castle, [facility]. This  will be arranged, sometime in a morning of the week of January 9th; the  office will call her to arrange that appointment. She sees Dr. Ladona Ridgel in 2  months, his office will call with that appointment.  Will arrange for her to  see the [facility] Wednesday,  May 30, 2004 for follow-up.  BRIEF HISTORY:  Ms. Madolin Twaddle is an 77 year old female. She has a history  of paroxysmal atrial fibrillation, she is on long-term Coumadin. She  underwent DC cardioversion here at [facility] December 24 with  conversion to a sinus rhythm.   She presented to the emergency room on December 29 from the [facility] with recurrent, rapid atrial flutter. She  complained of palpitations which started about 2:45 in the afternoon of  December 29.  She felt that they were just like her previous episodes of  atrial flutter as far as symptoms are concerned.  She denied chest pain. She  had minimal increase in shortness of breath, no syncope or presyncope.  The  patient has the diagnosis of COPD and is chronically short of breath.  Her  dyspnea is now worse than baseline. Also, she had a history of dyslipidemia  and hypertension.   HOSPITAL COURSE:  The patient was admitted, rate control effected with IV  Diltiazem, placed in IV heparin as her Coumadin level was subtherapeutic,  continued on her Rythmol and Toprol as well as Cardizem drip.   After admission through the  [facility] with complaints of  palpitations and findings of rapid atrial flutter, the patient was rate  controlled with IV Cardizem and did, indeed, convert to sinus rhythm during  the 24 hour stay that she had a [facility]. She was discharged home  with medications and follow-up as dictated.       GM/MEDQ  D:  05/25/2004  T:  05/25/2004  Job:  [id number]   cc:   Thomas C. Wall, M.D.   Redge Gainer. Waynard Edwards, M.D.  [address]  [place]  [place] [address]  Fax: [phone]   [facility]

## 2010-10-12 NOTE — Consult Note (Signed)
Julie Rich, Julie Rich                  ACCOUNT NO.:  [id number]   MEDICAL RECORD NO.:  [id number]          PATIENT TYPE:  INP   LOCATION:  3705                         FACILITY:  [facility]   PHYSICIAN:  Michael L. Reynolds, M.D.DATE OF BIRTH:  04/25/1922   DATE OF CONSULTATION:  DATE OF DISCHARGE:                                 CONSULTATION   REASON FOR EVALUATION:  Possible stroke.   HISTORY OF PRESENT ILLNESS:  This is the initial inpatient consultation  and evaluation of this 77 year old woman with a past medical history  which includes atrial fibrillation with chronic anticoagulation, as well  as multiple risk factors for vascular disease.  The patient reports that  she has had 2 episodes of alteration of vision, one occurring about a  month ago and one occurring 2 or 3 days ago.  She describes this as a  lightening bolt coming across her vision from left to right, which has  zigzag and has various colors.  This is present for a couple of  minutes and then resolves.  She is not aware of any lasting effect that  these episodes had on her vision.  She says the first episode lasted a  little bit longer than the second.  There was no associated focal  numbness, weakness, slurred speech, headache, or alteration of  consciousness.  She was an ophthalmologist about this who told her that  she had probably had a small stroke.  She then went to her primary  care physician, Dr. Waynard Edwards, and is admitted for workup of a presumed  stroke.  The patient states that she is feeling fine at this time.  She  denies any chest pain, shortness of breath, palpitations, or any other  associated symptoms beyond her normal baseline shortness of breath.   PAST MEDICAL HISTORY:  Remarkable for atrial fibrillation and chronic  anticoagulation.  She said that Coumadin was last checked a week ago,  and she has not had any need to change her dose in about 2 years.  She  also has a history of hypertension and  hyperlipidemia, the latter is  untreated.  She also has a history of COPD and elevated blood sugars,  although no diagnosis of frank diabetes.   FAMILY/SOCIAL/REVIEW OF SYSTEMS:  Outlined in the admission H&P by Dr.  Waynard Edwards of July 02, 2006, which is reviewed.   MEDICATIONS:  Prior to coming to the hospital, she was on Norvasc,  Rythmol, Toprol, Coumadin, Spiriva, Xopenex p.r.n., vitamin D, Restoril  p.r.n., and Advair.   PHYSICAL EXAMINATION:  VITAL SIGNS:  Temperature 97.9, blood pressure  168/70, pulse 67, respirations 20, O2 sat 95% on room air.  Weight 61  kilos.  GENERAL EXAMINATION:  This is a healthy woman supine in the hospital bed  in no distress.  HEENT:  Head:  Cranium is normocephalic and atraumatic.  Oropharynx is  benign.  NECK:  Supple without carotid although right supraclavicular bruit is  heard.  HEART:  Regular rhythm without murmurs.  NEURO EXAM:  Mental status:  She is awake and alert.  She is  fully  oriented to time, place, and person.  Recent and remote memory are  intact.  Attention span, concentration and fund of knowledge is  appropriate.  Speech is fluent and not dysarthric.  She has no  difficulty to confrontational naming, and she can repeat phrases.  Cranial nerves:  Pupils equal and reactive.  Extraocular movements were  full without nystagmus.  On careful testing of visual fields, I did not  notice any particular deficits, although she may have a little bit of  neglect in the right upper quadrant, although even this is a very soft  call.  Hearing is intact to conversational speech.  Face, tongue, and  palate move normally and symmetrically.  Motor:  Normal bulk and tone.  Normal strength in all tested extremity muscles.  Sensation:  Intact to  light touch and double simultaneous stimulation in all extremities.  Coordination, rapid movements are performed well.  Finger-to-nose  performed well.  Gait is deferred.  Reflexes 2+ and symmetric.  Toes  are  downgoing bilaterally.   LABORATORY REVIEW:  All labs are pending at this time.  She has not had  any neuro imaging to this point.   IMPRESSION:  Possible occipital stroke/transient ischemic attack in a  patient on chronic anticoagulation for atrial fibrillation and multiple  risk factors for vascular disease.  Her symptoms, if anything, sound  like migraine equivalents, and in fact, she does have a remote history  of migraine on specific questioning, although without aura.  She does  not have any visual deficits by examination at this time.   RECOMMENDATIONS:  Would agreed with the TIA workup including MRI, MRA,  carotid Dopplers, and 2D echocardiogram, and for now continue present  medications.  Stroke Service to follow with tests.      Michael L. Thad Ranger, M.D.  Electronically Signed     MLR/MEDQ  D:  07/02/2006  T:  07/03/2006  Job:  [id number]

## 2010-10-12 NOTE — H&P (Signed)
Julie Rich, Julie Rich                  ACCOUNT NO.:  [id number]   MEDICAL RECORD NO.:  [id number]          PATIENT TYPE:  INP   LOCATION:  1831                         FACILITY:  [facility]   PHYSICIAN:  Thomas C. Wall, M.D.   DATE OF BIRTH:  Jan 18, 1922   DATE OF ADMISSION:  05/17/2004  DATE OF DISCHARGE:                                HISTORY & PHYSICAL   HISTORY OF PRESENT ILLNESS:  Ms. Bacot is a delightful 77 year old female,  with no known history of coronary artery disease , and reported history of  paroxysmal atrial fibrillation, initially diagnosed approximately 4 years  ago in [place].  She is on chronic Coumadin, which is followed by Dr.  Waynard Edwards.  She now presents to the emergency room with paroxysmal atrial  tachycardia, with reported onset at approximately 4 o'clock yesterday  afternoon.   The patient reports that she has had occasional palpitations over the last  several years, but that these typically occur once every six months, and  last less than 4 hours in duration.  She usually takes an extra Toprol  tablet for this; however, at approximately 4 o'clock yesterday afternoon,  she developed recurrent palpitations with only mild associated dyspnea, but  otherwise no associated chest pain, dyspnea, diaphoresis, nausea, or  presyncope.  She did not take any additional medications yesterday, and,  when it continued to persist this morning, presented to [facility] for an evaluation.  An electrocardiogram there revealed  supraventricular tachycardia at 107 beats per minute.  She was thus referred  to our emergency room.   On admission, the patient presented with a systolic blood pressure of 189.  Initial electrocardiogram reveals a supraventricular tachycardia at 109  beats per minute, suggestive of an atypical flutter versus an atrial  tachycardia.  The patient reports no significant associated symptoms.   ALLERGIES:  SULFA.   MEDICATIONS PRIOR TO  ADMISSION:  1.  Norvasc 2.5 daily.  2.  Rythmol 150 t.i.d.  3.  Coumadin 5/2.5 mg alternating daily.  4.  Toprol-XL 50 daily.  5.  Xanax 0.25 p.r.n.  6.  Flovent nasal spray twice daily.  7.  Serevent once daily.   PAST MEDICAL HISTORY:  1.  Paroxysmal atrial fibrillation.  2.  Chronic Coumadin.  3.  Hypertension.  4.  Dyslipidemia.  5.  Status post hysterectomy.  6.  Remote benign breast tumor.   SOCIAL HISTORY:  The patient lives alone in [place].  She has 2 grown  children.  She has never smoked tobacco, and denies alcohol use.  She is  active.  She denies drinking caffeinated beverages.   FAMILY HISTORY:  Mother deceased at age 46.  Father deceased at age 59  secondary to a motor vehicle accident.  Her siblings have no known coronary  artery disease.   REVIEW OF SYSTEMS:  Denies any history of myocardial infarction, congestive  heart failure, syncope, stroke, exertional chest discomfort, orthopnea, PND,  or pedal edema.  Has some mild exertional dyspnea which has been chronic  over the past several years, but with no recent  exacerbation.  Denies any  recent fever, productive cough, or chills.  Denies any recent evidence of  upper or lower GI bleeding.  Denies history of thyroid disease.  The  remaining systems are negative.   PHYSICAL EXAMINATION:  VITAL SIGNS:  Blood pressure 189/101, pulse 112 on  admission, SaO2 of 98% (two liters).  GENERAL:  An 77 year old female in no apparent distress.  HEENT:  Normocephalic and atraumatic.  NECK:  __________ carotid pulses without bruits.  No JVD.  LUNGS:  Clear to auscultation in all fields.  HEART:  Regular rhythm with increased rate.  No significant murmurs, no  rubs.  ABDOMEN:  Soft, nontender.  EXTREMITIES:  __________ femoral and distal pulses.  No pedal edema.  NEUROLOGIC:  No focal deficit.   CHEST X-RAY:  No acute disease.   ELECTROCARDIOGRAM:  SVT at 109 (question atypical flutter versus atrial  tachycardia at 2:1  conduction), left axis deviation, upsloping inferolateral  ST depression.   LABORATORY DATA:  Hemoglobin 15.4, hematocrit 45.3, WBC's 5.3, platelets  222.  Sodium 135, potassium 3.9, BUN 15, creatinine 1.0, glucose 101.  Normal liver enzymes.  INR of 2.5.   IMPRESSION:  1.  Supraventricular tachycardia.      1.  Question atypical flutter versus atrial tachycardia.      2.  History of paroxysmal atrial fibrillation.  2.  Chronic Coumadin.      1.  Therapeutic.  3.  Hypertension.  4.  History of hypercholesterolemia.   PLAN:  The patient will be admitted to telemetry for continued monitoring  and management of recurrent supraventricular tachycardia.  She was initially  treated with 3 doses of 5 mg of IV Lopressor in the emergency room, but with  no response.  She did, however, respond to a 10 mg IV Diltiazem bolus which  reduced her rate to the 90 range.  Her blood pressure has also improved  significantly since she first arrived.  We will therefore maintain her on an  IV Diltiazem drip with titration up to 15 cc an hour.  At the same time, she  will be started on low-dose Diltiazem 30 mg q.6h., and Norvasc will be  discontinued.  We will, however, continue her on Toprol 50 daily. and  Rythmol 150 t.i.d.  Coumadin will also be continued at its current dose.   In addition to routine blood work, we will check a TSH level, fasting lipid  profile, and a magnesium level.  A 2-D echocardiogram has been ordered for  the morning, and, if normal, no further cardiac work-up is recommended at  this time.  Additionally, if the patient converts to normal sinus rhythm,  the plan is to discharge her tomorrow and follow up with Dr. Valera Castle in  approximately 2 weeks.  If, however, her rate is controlled, but she remains  in atrial fibrillation, then consideration will be given to substituting  Rythmol with another antiarrhythmic.      Gene   GS/MEDQ  D:  05/17/2004  T:  05/17/2004  Job:   [id number]   cc:   Loraine Leriche A. Waynard Edwards, M.D.  [address]  [place]  [place] [address]  Fax: [phone]   [facility]

## 2010-10-12 NOTE — H&P (Signed)
NAMETERRAN, HOLLENKAMP                  ACCOUNT NO.:  [id number]   MEDICAL RECORD NO.:  [id number]          PATIENT TYPE:  INP   LOCATION:  3705                         FACILITY:  [facility]   PHYSICIAN:  Mark A. Perini, M.D.   DATE OF BIRTH:  02-23-1922   DATE OF ADMISSION:  07/02/2006  DATE OF DISCHARGE:                              HISTORY & PHYSICAL   CHIEF COMPLAINT:  Possible occipital stroke.   HISTORY OF PRESENT ILLNESS:  Julie Rich is a pleasant 77 year old female with  a past history significant for atrial fibrillation, hypertension,  hyperlipidemia and asthma and possible COPD, as well.  She had been in  her usual state of health until she developed, 2-3 days ago, a colored  pattern of vision across her visual field and some decreased vision in  the left side of her visual field of both eyes.  She was seen by the  ophthalmologist and felt to possibly have an occipital stroke, and she  is seen in our office today.  She denies any other stroke-type symptoms  including specifically she denies any lateralizing weakness, dysarthria,  dysphasia or dysphagia.  It is felt that she should be admitted for  further evaluation of her possible stroke.   PAST MEDICAL HISTORY:  1. History of atrial fibrillation/flutter since 1997.  She was      cardioverted once for atrial flutter in December of 2005.  2. Hypertension.  3. TIA in 1997 with a brief episode of memory loss that lasted 20      minutes.  4. History of hysterectomy without oophorectomy.  5. Right breast benign lumpectomy in the past.  6. Chronic lung disease, felt to be asthma, since 2000, and possibly a      component of COPD.  7. Hyperlipidemia.  The patient is consistently deferred treatment of      this.  8. Osteoporosis.  9. First degree atrioventricular block, incomplete right bundle branch      block and left anterior fascicular block.  10.Impaired fasting glucose.   ALLERGIES:  1. NAPROSYN.  2. SEPTRA.  3. LEVAQUIN.   CURRENT MEDICATIONS:  1. Norvasc 5 mg daily.  2. Rythmol 150 mg three times a day.  3. Toprol-XL 100 mg daily.  4. Coumadin 4 mg five days a week and 2 mg each Tuesday and each      Friday,'  5. Spiriva one dose daily.  6. Xopenex HFA two puffs every 4 hours as needed.  7. Vitamin D 2000 units daily.  8. Restoril 50 mg q.h.s. as needed.  9. Advair 100/50 one puff twice a day.   SOCIAL HISTORY:  She has been divorced since 71.  She has 2 children,  4 grandchildren and 4 great-grandchildren.  [place]  She is a retired Catering manager.  She has never smoked.  There is  no alcohol or drug use history.   FAMILY HISTORY:  Father died at age 37 of a motor vehicle accident.  Mother died at age 40 of old age.  One brother died of sudden death.  One of her daughters has chronic fatigue and fibromyalgia.   REVIEW OF SYSTEMS:  As per the HPI.  She denies any blood from above or  below.  She states that her INR was checked in [place] 2 weeks ago and  was therapeutic.   PHYSICAL EXAMINATION:  VITAL SIGNS:  Weight 135 which is stable, blood  pressure 154/100 pulse 76, temperature 97.6, 96% saturation on room air.  GENERAL:  She is in no acute distress.  HEENT:  Normocephalic and atraumatic.  Pupils equally round and reactive  to light.  Extraocular movements are intact.  There is no icterus or  pallor.  She has no pronator drift.  She has normal finger-nose-finger  testing.  She has no focal weakness.  She has 2+ deep tendon reflexes  throughout, and gait is normal.  NECK:  Supple with no lymphadenopathy or JVD.  LUNGS:  Clear to auscultation bilaterally with no wheezes, rales or  rhonchi.  HEART:  Regular rate and rhythm.  It actually sounds regular at this  time with no significant murmur, rub, or gallop.  She has no edema.  She  has 2+ pulses peripherally.   LABORATORY DATA:  Pending at this time.   ASSESSMENT AND PLAN:  An 77 year old female with possible  occipital  stroke.  We will admit to a telemetry bed.  Will check a MRI/MRA of the  brain.  Will also check carotid Dopplers, transcranial Dopplers and a 2-  dimensional echocardiogram.  We will obtain a neurology consult.  We  will add an 81 mg aspirin to her Coumadin therapy, and most likely we  will attempt to insist that she take some sort of cholesterol treatment.  Her last LDL cholesterol in April of 2007 was 177.  We will monitor her  blood pressure, and she may need an increase in her blood pressure  regimen.  She is a full code status.           ______________________________  Julie Rich, M.D.     MAP/MEDQ  D:  07/02/2006  T:  07/02/2006  Job:  [id number]   cc:   Molly Maduro L. Dione Booze, M.D.  Thomas C. Wall, MD, [facility]

## 2010-10-12 NOTE — Discharge Summary (Signed)
Julie Rich, Julie Rich                  ACCOUNT NO.:  [id number]   MEDICAL RECORD NO.:  [id number]          PATIENT TYPE:  INP   LOCATION:  3705                         FACILITY:  [facility]   PHYSICIAN:  Mark A. Perini, M.D.   DATE OF BIRTH:  11-Sep-1921   DATE OF ADMISSION:  07/02/2006  DATE OF DISCHARGE:  07/04/2006                               DISCHARGE SUMMARY   DISCHARGE DIAGNOSES:  1. Chronic occipital stroke.  2. Evidence of previous pontine strokes.  3. Hyperlipidemia.  4. History of paroxysmal atrial fibrillation currently in normal sinus      rhythm.  5. Hypertension.  6. History of transient ischemic attack in 1997 with a brief episode      of memory loss.  7. Asthma and possibly a component of chronic obstructive pulmonary      disease controlled currently.  8. Osteoporosis.  9. First-degree atrioventricular block, incomplete right bundle branch      block and left anterior fascicular block.  10.Impaired fasting glucose.   PROCEDURES:  1. MRI and MRA of the brain which showed chronic left occipital      infarct, chronic white matter and pontine infarcts, no acute      infarct.  MRI showed mild to moderate disease in the bilateral      posterior cerebral arteries but no significant large vessel      occlusions.  2. Transcranial Dopplers which showed antegrade flow.  3. A 2-D echo showed normal LV function, normal systolic function, EF      55-60%, no wall motion abnormalities, mild increased left      ventricular wall thickness, some diastolic dysfunction, mild      increased peak pulmonary artery pressure and mild aortic      regurgitation.  4. Carotid Doppler showed no evidence of internal carotid artery      stenosis.  Vertebral flow was antegrade   DISCHARGE MEDICATIONS:  1. Resume Norvasc 2.5 mg daily.  2. Rythmol 150 mg three times a day.  3. Toprol XL 100 mg daily.  4. Coumadin 4 mg daily 5 days a week and 2 mg each Tuesday and Friday.  5. Spiriva one dose  daily.  6. Xopenex HFA as needed.  7. Vitamin D 2000 units daily.  8. Restoril 50 mg each evening as needed.  9. Advair 100/50 1 puff twice a day.  10.Aspirin 81 mg daily which is new.  11.Lipitor 80 mg one half pill daily which is also new.  12.Julie Rich is to take generic Protonix 40 mg once a day with food for GI      protection.  13.Julie Rich is now on Coumadin and aspirin.  14.Julie Rich is to take over-the-counter B12 500 mcg once a day.   HISTORY OF PRESENT ILLNESS:  Julie Rich is a pleasant 77 year old female with  history significant for paroxysmal atrial fibrillation, hypertension,  hyperlipidemia and asthma and COPD.  Julie Rich was in her usual state of  health, however, in December Julie Rich did notice an episode of unusual  colored vision across her visual fields.  This is particularly on the  left side.  Two to three days ago Julie Rich had a recurrence of this event and  was seen by an ophthalmologist and was felt to possibly have an  occipital stroke.  Julie Rich was subsequent seen in our office on the date of  admission and it was felt that admission and further evaluation was  warranted.   HOSPITAL COURSE:  Julie Rich was admitted to a telemetry bed.  Julie Rich  remained in normal sinus rhythm during her stay.  Julie Rich remained stable  from a cardiovascular standpoint.  Julie Rich did have a few episodes of the  visual symptom, however, her workup showed a chronic left occipital  infarct and it was felt that her symptoms were related to his chronic  infarct.  Aspirin was added to her therapy as well as a lipid therapy.  On July 04, 2006 Julie Rich was deemed stable for discharge home.   DISCHARGE PHYSICAL EXAMINATION:  VITAL SIGNS:  Temperature 98.1, pulse  67, blood pressure 117/52, respiratory rate 20, 98% saturation on room  air.  GENERAL APPEARANCE:  Julie Rich was in no acute distress.  LUNGS:  Clear to auscultation bilaterally with no wheezes, rales or  rhonchi.  HEART:  Regular rate and rhythm with no murmur, rub or gallop.   ABDOMEN:  Soft and nontender.  No masses or hepatosplenomegaly.  There  was no edema.  Julie Rich was alert and oriented x4 and moved all extremities  symmetrically and equally with grossly normal strength.   LABORATORY DATA ON DISCHARGE:  INR 2.2.  TSH 2.36.  B12 352.  Other  notable lab data included an albumin of 3.4, otherwise normal liver  tests.  Normal phosphorus and magnesium levels.  White count 5.2,  hemoglobin 13.9, platelet count 211,000 with a normal differential on  July 03, 2006.  Sodium 137, potassium 4, chloride 105, CO2 25, BUN  21, creatinine 0.86 with a glucose of 109 on July 03, 2006.  Total  cholesterol was 225, triglycerides 78, HDL 14 and LDL 169.   DISCHARGE INSTRUCTIONS:  Julie Rich is to follow a low salt, heart healthy  diet.  Julie Rich is to increase her activity slowly.  Julie Rich is to follow up with  Dr. Waynard Edwards in 3-4 weeks.  Julie Rich is to follow up with Dr. Thad Ranger and is  to also have an EEG brain wave test as an outpatient.  Julie Rich will call for  this visit.  Julie Rich is to recheck her Coumadin level in 1 week and Julie Rich does  this in [place], [place], where Julie Rich lives.  Julie Rich is to call if Julie Rich  has any recurrent problems.           ______________________________  Redge Gainer Waynard Edwards, M.D.     MAP/MEDQ  D:  07/08/2006  T:  07/08/2006  Job:  [id number]   cc:   Casimiro Needle L. Thad Ranger, M.D.  Robert L. Dione Booze, M.D.  Thomas C. Wall, MD, [facility]

## 2010-10-12 NOTE — H&P (Signed)
NAMEROSHAWNDA, PECORA                  ACCOUNT NO.:  [id number]   MEDICAL RECORD NO.:  [id number]          PATIENT TYPE:  INP   LOCATION:  1827                         FACILITY:  [facility]   PHYSICIAN:  Pricilla Riffle, M.D.    DATE OF BIRTH:  1922-03-11   DATE OF ADMISSION:  05/24/2004  DATE OF DISCHARGE:                                HISTORY & PHYSICAL   IDENTIFICATION:  The patient is an 77 year old woman with history of  paroxysmal atrial fibrillation/flutter (atypical) who presents for recurrent  palpitations and atrial flutter.   HISTORY OF PRESENT ILLNESS:  The patient was seen several years ago in  [place], does not recall the name of the cardiologist.  She has been on  Coumadin and Rythmol for atrial fibrillation.   She presented in December to [facility] with paroxysmal atrial  tachycardia/atrial flutter.  She underwent cardioversion and was discharged  home on Rythmol, decreased dose of Toprol.   Today at around 2:45, she says she felt her heart racing again.  She had  some mild increasing shortness of breath, no dizziness, no chest pain.   ALLERGIES:  SULFA.   CURRENT MEDICATIONS:  1.  Rythmol 150 t.i.d.  2.  Coumadin as directed 4 mg for 5 days and 2 mg for 2 days (Tuesday and      Friday).  3.  Toprol XL 50 daily.  4.  Xanax p.r.n.  5.  Serevent.  6.  Flovent.   PAST MEDICAL HISTORY:  1.  Atrial fibrillation/atrial flutter.  2.  Hypertension.  3.  Dyslipidemia.  4.  COPD.   PAST SURGICAL HISTORY:  1.  Status post hysterectomy.  2.  Status post benign breast tumor removal.   SOCIAL HISTORY:  The patient lives alone, never smoked, denies alcohol use.  Is active.  Denies caffeine intake.   FAMILY HISTORY:  Mother died at age 22.  Father died at age 59 secondary to  MVA.  No known coronary artery disease.   REVIEW OF SYSTEMS:  Chronic shortness of breath.  On real significant  change.  Palpitations as noted.  Otherwise all systems reviewed negative to  the  above problem except as noted.   PHYSICAL EXAMINATION:  GENERAL:  The patient is currently in no acute  distress.  VITAL SIGNS:  Blood pressure 146/62, pulse 135 and slightly irregular,  temperature 97.4.  NECK:  JVP is normal.  No bruits.  HEENT:  Normocephalic and atraumatic.  PERRLA.  LUNGS:  Decreased air flow, no wheezes.  CARDIAC:  Irregularly irregular, tachycardic.  S1, S2.  No murmurs.  ABDOMEN:  Supple.  EXTREMITIES:  Good distal pulses, no edema.  NEUROLOGIC:  Alert and oriented x 3.  SKIN:  No rashes.   LABORATORY DATA AND OTHER STUDIES:  A 12-lead EKG (done at Dr. Laurey Morale  office) shows what appears to be atrial flutter with variable conduction.  Appears typical by these tracings in the emergency room, again more typical,  again with variable conduction.  A 12-lead EKG from December 23 shows what  appears to be atypical atrial flutter.  Other labs pending.   IMPRESSION:  The patient is an 77 year old woman with recurrent atrial  flutter, tachycardic now.  She is on Rythmol and Coumadin.  Will begin IV  Cardizem, continue Toprol.  Await INR.  Have discussed with Dr.  Lewayne Bunting.  EP will evaluate.  Question possible ablation given that she was  just discharged with supraventricular tachycardia plus cardioversion a week  ago.       PVR/MEDQ  D:  05/24/2004  T:  05/24/2004  Job:  [id number]

## 2010-12-28 ENCOUNTER — Encounter: Payer: Self-pay | Admitting: Cardiology

## 2011-02-25 LAB — GLUCOSE, CAPILLARY: Glucose-Capillary: 105 — ABNORMAL HIGH

## 2011-02-27 ENCOUNTER — Telehealth: Payer: Self-pay | Admitting: Pulmonary Disease

## 2011-02-27 NOTE — Telephone Encounter (Signed)
Ok with me 

## 2011-02-27 NOTE — Telephone Encounter (Signed)
Dr. Sherene Sires is this switch ok with you? If so we will schedule pt to see you in [place]. Carron Curie, CMA

## 2011-02-27 NOTE — Telephone Encounter (Signed)
RA, is this okay with you? Please advise, thanks!

## 2011-02-28 NOTE — Telephone Encounter (Signed)
Julie Rich advised with pt being 77 and not wanting to travel so much Julie Rich is best for her. I informed caller that MW currently is only there approximately once a month and if pt needs to be seen acutely she will have to come to the [place] office between visits and MW will be her primary pulmonologist but is free to see anyone with openings when needed. Pt c/o productive cough with blood on Monday with some chest tightness, did not call anyone when it happened. I offered for pt to be seen by RA today at 1:30pm and she declined same, stating she will check with pt first and if worse will take to clinic near her home. Caller transferred to ext [phone] to schedule appointment in [facility] Nov 6.

## 2011-02-28 NOTE — Telephone Encounter (Signed)
Fine with me

## 2011-02-28 NOTE — Telephone Encounter (Signed)
PT'S DAUGHTER CALLED AFTER SPEAKING TO JESSICA R AND HAS SCHEDULED PT TO BE SEEN BY MW IN [place]. Julie Rich

## 2011-03-11 LAB — DIFFERENTIAL
Eosinophils Relative: 2
Lymphocytes Relative: 22
Lymphs Abs: 1.4
Monocytes Absolute: 0.6
Monocytes Relative: 9

## 2011-03-11 LAB — CBC
HCT: 41.4
Hemoglobin: 13.8
RBC: 4.63
WBC: 6.4

## 2011-03-11 LAB — BASIC METABOLIC PANEL
GFR calc non Af Amer: >60
Potassium: 3.8
Sodium: 141

## 2011-03-11 LAB — PROTIME-INR: Prothrombin Time: 29.9 — ABNORMAL HIGH

## 2011-03-25 ENCOUNTER — Telehealth: Payer: Self-pay | Admitting: Cardiology

## 2011-03-25 NOTE — Telephone Encounter (Signed)
Pt daughter calling wanting pt to be seen ASAP. Pt c/o episodes where heart feels like it is out of rhythm. Pt daughter was told next available appt in [place] was 11/28--which is too late.  Please return pt call to discuss further.

## 2011-03-25 NOTE — Telephone Encounter (Signed)
Per daughter - pt has been having some episodes of palps that concern her because she lives alone.  They would like for pt to be seen in the [place] office.  Next available appointment scheduled.

## 2011-04-01 ENCOUNTER — Encounter: Payer: Self-pay | Admitting: Internal Medicine

## 2011-04-02 ENCOUNTER — Ambulatory Visit (INDEPENDENT_AMBULATORY_CARE_PROVIDER_SITE_OTHER): Payer: Medicare Other | Admitting: Internal Medicine

## 2011-04-02 ENCOUNTER — Encounter: Payer: Self-pay | Admitting: Internal Medicine

## 2011-04-02 DIAGNOSIS — J988 Other specified respiratory disorders: Secondary | ICD-10-CM

## 2011-04-02 NOTE — Progress Notes (Signed)
  Subjective:    Patient ID: Julie Rich, female    DOB: 02/22/1922, 77 y.o.   MRN: [id number]  HPI  35 yowf never smoker  with sob/ cough starting around 2000 & stable pulm nodules for FU  03/2007 FEV1 49%, FVC 82%  second hand smoke exposure - ? chronic bronchitis vs late onset asthma.  stable BL indeterminate nodules since 5/08.  Complex cyst in liver on MRI 1/09 likley benign.    10/09 has done well from breathing standpt , on advair once daily & spiriva  episodes of flushing, tachycardia & hypertension lasting a few hrs - maxzide started by dr perini  spirometry 10/09>> FEV1 34%, FEV1 % 58, FVC 54%   May 25, 2009   PFTs - improved - FEV1 59%, FVC 79%,   DLCO 68%     04/02/2011  Julie Rich  tx to [facility] cc doe x [employer]  Worse with extremes of cold or heat and when goes out does so  at a slow pace.  C/o sev months of L cp x few seconds positional in nature, minimal dry cough and hoarseness.   Also new ha above L eye,  Tends to be worse as day goes on, no assoc purulent drainage, better with tylenol  ROS  At present neg for  any significant sore throat, dysphagia, itching, sneezing,  nasal congestion or excess/ purulent secretions,  fever, chills, sweats, unintended wt loss, pleuritic or exertional cp, hempoptysis, orthopnea pnd or leg swelling.  Also denies presyncope, palpitations, heartburn, abdominal pain, nausea, vomiting, diarrhea  or change in bowel or urinary habits, dysuria,hematuria,  rash, arthralgias, visual complaints, headache, numbness weakness or ataxia.   ROS  At present neg for  any significant sore throat, dysphagia, itching, sneezing,  nasal congestion or excess/ purulent secretions,  fever, chills, sweats, unintended wt loss, pleuritic or exertional cp, hempoptysis, orthopnea pnd or leg swelling.  Also denies presyncope, palpitations, heartburn, abdominal pain, nausea, vomiting, diarrhea  or change in bowel or urinary habits,  dysuria,hematuria,  rash, arthralgias, visual complaints, headache, numbness weakness or ataxia.    Marland Kitchen    Past Medical History:  Hypertension  Atrial Fibrillation       Review of Systems  Constitutional: Negative for fever and unexpected weight change.  HENT: Positive for congestion and postnasal drip. Negative for ear pain, nosebleeds, sore throat, rhinorrhea, sneezing, trouble swallowing, dental problem and sinus pressure.   Eyes: Negative for redness and itching.  Respiratory: Positive for cough and shortness of breath. Negative for chest tightness and wheezing.   Cardiovascular: Negative for palpitations and leg swelling.  Gastrointestinal: Negative for nausea and vomiting.  Genitourinary: Negative for dysuria.  Musculoskeletal: Negative for joint swelling.  Skin: Negative for rash.  Neurological: Negative for headaches.  Hematological: Does not bruise/bleed easily.  Psychiatric/Behavioral: Negative for dysphoric mood. The patient is not nervous/anxious.        Objective:   Physical Exam   wt 124 dec '10 119 March 30, 2010 > 04/02/2011  120 Gen. Pleasant, well-nourished, in no distress  ENT - no lesions, no post nasal drip  Neck: No JVD, no thyromegaly, no carotid bruits  Lungs: no use of accessory muscles, no dullness to percussion, clear without rales or rhonchi  Cardiovascular: Rhythm regular, heart sounds normal, no murmurs or gallops, no peripheral edema  Musculoskeletal: No deformities, no cyanosis or clubbing     03/15/11 cxr copd/ mild cm  Assessment & Plan:

## 2011-04-02 NOTE — Assessment & Plan Note (Signed)
DDX of  difficult airways managment all start with A and  include Adherence, Ace Inhibitors, Acid Reflux, Active Sinus Disease, Alpha 1 Antitripsin deficiency, Anxiety masquerading as Airways dz,  ABPA,  allergy(esp in young), Aspiration (esp in elderly), Adverse effects of DPI,  Active smokers, plus two Bs  = Bronchiectasis and Beta blocker use..and one C= CHF  Adherence is always the initial "prime suspect" and is a multilayered concern that requires a "trust but verify" approach in every patient - starting with knowing how to use medications, especially inhalers, correctly, keeping up with refills and understanding the fundamental difference between maintenance and prns vs those medications only taken for a very short course and then stopped and not refilled.   The proper method of use, as well as anticipated side effects, of this metered-dose inhaler are discussed and demonstrated to the patient. Improved only to 25% as did teaching for dpi and may need to transition purely to nebulized duoneb/budesonide if condition worsens or flares.

## 2011-04-02 NOTE — Patient Instructions (Addendum)
Work on inhaler technique:  relax and gently blow all the way out then take a nice smooth deep breath back in, triggering the inhaler at same time you start breathing in.  Hold for up to 5 seconds if you can.  Rinse and gargle with water when done   If your mouth or throat starts to bother you,   I suggest you time the inhaler to your dental care and after using the inhaler(s) brush teeth and tongue with a baking soda containing toothpaste and when you rinse this out, gargle with it first to see if this helps your mouth and throat.     Stay as active as you can  See if using your ventolin improves your activity tolerance - up to every 4 hours and if that's not helping we need to see you right away.  Ok to use afrin as needed for sinus headache.  If not improving call [facility] [phone] for sinus  Ct   We'll see you here 6 months

## 2011-04-12 ENCOUNTER — Encounter: Payer: Self-pay | Admitting: Cardiology

## 2011-04-24 ENCOUNTER — Encounter: Payer: Self-pay | Admitting: Cardiology

## 2011-04-24 ENCOUNTER — Ambulatory Visit (INDEPENDENT_AMBULATORY_CARE_PROVIDER_SITE_OTHER): Payer: Medicare Other | Admitting: Cardiology

## 2011-04-24 VITALS — BP 105/56 | HR 50 | Ht 61.0 in | Wt 121.0 lb

## 2011-04-24 DIAGNOSIS — I10 Essential (primary) hypertension: Secondary | ICD-10-CM

## 2011-04-24 DIAGNOSIS — R5383 Other fatigue: Secondary | ICD-10-CM

## 2011-04-24 DIAGNOSIS — I4891 Unspecified atrial fibrillation: Secondary | ICD-10-CM

## 2011-04-24 DIAGNOSIS — R5381 Other malaise: Secondary | ICD-10-CM

## 2011-04-24 NOTE — Assessment & Plan Note (Signed)
This seems to not be as severe.  She will continue on the medications as listed.

## 2011-04-24 NOTE — Assessment & Plan Note (Signed)
The blood pressure is at target. No change in medications is indicated. We will continue with therapeutic lifestyle changes (TLC).  

## 2011-04-24 NOTE — Progress Notes (Signed)
HPI The patient presents for followup of atrial fibrillation. She will occasionally have sensations like her heart is beating hard. For instance last night her heart rate was about 110 and beating hard but she did not think it was irregular and it was not faster than this. She doesn't think it was similar to her atrial fibrillation. Prior to being treated with her with Rythmol she had severe symptomatic paroxysms of this. She does get anxious.  She denies any chest pressure, neck or arm discomfort. She's had no presyncope or syncope. She's had no new shortness of breath, PND or orthopnea. She has had a recent urinary tract infection and has had treatment for this. She requests a followup UA.   Allergies  Allergen Reactions  . Ciprofloxacin   . Quinolones   . Sulfonamide Derivatives     Current Outpatient Prescriptions  Medication Sig Dispense Refill  . albuterol (PROVENTIL,VENTOLIN) 90 MCG/ACT inhaler Inhale 2 puffs into the lungs every 6 (six) hours as needed.        . ALPRAZolam (XANAX) 0.25 MG tablet Take 0.25 mg by mouth as needed.        Marland Kitchen amLODipine (NORVASC) 10 MG tablet Take 10 mg by mouth daily.        Marland Kitchen aspirin 81 MG tablet Take 81 mg by mouth daily.        Marland Kitchen atorvastatin (LIPITOR) 10 MG tablet Take 10 mg by mouth daily.        . Fluticasone-Salmeterol (ADVAIR DISKUS) 250-50 MCG/DOSE AEPB Inhale 1 puff into the lungs 2 (two) times daily.        Marland Kitchen levothyroxine (SYNTHROID, LEVOTHROID) 50 MCG tablet Take 25 mcg by mouth daily.       Marland Kitchen losartan (COZAAR) 100 MG tablet Take 100 mg by mouth daily.        . metoprolol (TOPROL-XL) 100 MG 24 hr tablet Take 100 mg by mouth daily.        Marland Kitchen omeprazole (PRILOSEC) 20 MG capsule Take 20 mg by mouth every other day.       . propafenone (RYTHMOL) 150 MG tablet Take 150 mg by mouth 3 (three) times daily.        . temazepam (RESTORIL) 15 MG capsule Take 15 mg by mouth at bedtime.        Marland Kitchen tiotropium (SPIRIVA) 18 MCG inhalation capsule Place 18  mcg into inhaler and inhale daily.        Marland Kitchen warfarin (COUMADIN) 4 MG tablet Take 4 mg by mouth as directed.          Past Medical History  Diagnosis Date  . HTN (hypertension)   . A-fib   . Dyslipidemia   . Hypothyroidism   . Stroke     Past Surgical History  Procedure Date  . Total abdominal hysterectomy     ROS:  As stated in the HPI and negative for all other systems.  PHYSICAL EXAM BP 105/56  Pulse 57  Ht 5\' 1"  (1.549 m)  Wt 121 lb (54.885 kg)  BMI 22.86 kg/m2 GENERAL:  Well appearing HEENT:  Pupils equal round and reactive, fundi not visualized, oral mucosa unremarkable NECK:  No jugular venous distention, waveform within normal limits, carotid upstroke brisk and symmetric, no bruits, no thyromegaly LYMPHATICS:  No cervical, inguinal adenopathy LUNGS:  Clear to auscultation bilaterally BACK:  No CVA tenderness CHEST:  Unremarkable HEART:  PMI not displaced or sustained,S1 and S2 within normal limits, no S3, no S4, no  clicks, no rubs, no murmurs ABD:  Flat, positive bowel sounds normal in frequency in pitch, no bruits, no rebound, no guarding, no midline pulsatile mass, no hepatomegaly, no splenomegaly EXT:  2 plus pulses throughout, no edema, no cyanosis no clubbing SKIN:  No rashes no nodules NEURO:  Cranial nerves II through XII grossly intact, motor grossly intact throughout PSYCH:  Cognitively intact, oriented to person place and time  EKG:  Normal sinus rhythm, rate 55, left axis deviation, left ventricular hypertrophy, first degree AV block  ASSESSMENT AND PLAN

## 2011-04-24 NOTE — Assessment & Plan Note (Signed)
I had a long discussion with the patient and her daughter about this. We discussed strategies to treat her strong heartbeat. We discussed when she might present to the emergency room. She tolerates anticoagulation. She will continue meds as listed other than stopping the aspirin. She will be discontinuing omeprazole which apparently was prescribed only because of the aspirin.

## 2011-04-24 NOTE — Patient Instructions (Addendum)
Please stop Asprin and omeprazole. Continue all other medications as listed.  Follow up in 6 months with Dr Antoine Poche.  You will receive a letter in the mail 2 months before you are due.  Please call us when you receive this letter to schedule your follow up appointment.

## 2011-05-14 ENCOUNTER — Encounter: Payer: Self-pay | Admitting: Physician Assistant

## 2011-06-03 ENCOUNTER — Telehealth: Payer: Self-pay | Admitting: Cardiology

## 2011-06-03 ENCOUNTER — Encounter: Payer: Self-pay | Admitting: Physician Assistant

## 2011-06-03 ENCOUNTER — Ambulatory Visit (INDEPENDENT_AMBULATORY_CARE_PROVIDER_SITE_OTHER): Payer: Medicare Other | Admitting: Physician Assistant

## 2011-06-03 VITALS — BP 115/69 | HR 108 | Ht 60.0 in | Wt 118.0 lb

## 2011-06-03 DIAGNOSIS — I10 Essential (primary) hypertension: Secondary | ICD-10-CM

## 2011-06-03 DIAGNOSIS — R0602 Shortness of breath: Secondary | ICD-10-CM

## 2011-06-03 DIAGNOSIS — I4891 Unspecified atrial fibrillation: Secondary | ICD-10-CM

## 2011-06-03 DIAGNOSIS — I4892 Unspecified atrial flutter: Secondary | ICD-10-CM | POA: Insufficient documentation

## 2011-06-03 DIAGNOSIS — R0600 Dyspnea, unspecified: Secondary | ICD-10-CM | POA: Insufficient documentation

## 2011-06-03 NOTE — Assessment & Plan Note (Signed)
This is an atypical flutter.  She has rapid ventricular rate.  However, she just took her Toprol for today.  I reviewed her ECG with Dr. Johney Frame, who was in the office today.  He suggested that we check a propafenone level.  She has evidence of conduction system disease.  We are concerned about adjusting her rate controlling medications and inducing significant bradycardia should she return to normal sinus rhythm.  If her Propafenone level is low, we can increase her dose.  If it is normal, Dr. Johney Frame suggested that we discontinue her propafenone and start amiodarone.  Her INR today is 3.2.  INR on 12/21 was 2.1, on 12/4 it was 1.2, on 11/12 it was 2.6, on 10/23 it was 2.9.  I will have her followup with Dr. Rollene Rotunda in 2 weeks.

## 2011-06-03 NOTE — Telephone Encounter (Signed)
New problem:  Patient daughter calling,. C/O irregular pulse patten. Heart rate check late yesterday - running high. Home health at the home on yesterday. Patient daughter would like for her be seen today if possible.

## 2011-06-03 NOTE — Progress Notes (Signed)
[address] [place], [place]  [address] Phone: [phone] Fax:  [phone]  Date:  06/03/2011   Name:  Julie Rich       DOB:  [birth year] MRN:  [id number]  PCP:  Dr. Waynard Edwards Primary Cardiologist:  Dr. Rollene Rotunda  Primary Electrophysiologist:  Dr. Lewayne Bunting    History of Present Illness: Julie Rich is a 78 y.o. female who presents for palpitations.  She has a history of atrial fibrillation, SVT, hypertension, hyperlipidemia, COPD, hypothyroidism, GERD, anxiety.  She is on Coumadin therapy.  She was previously on flecainide.  She remains on Rythmol.  Last echocardiogram 2/08: EF 55-60%, mild LVH, mild AI, mildly increased PASP.  She was last seen by Dr. Antoine Poche 11/12.  She complained of tachy-palpitations at that time.  No medication changes were made aside from stopping her aspirin in light of her Coumadin therapy.  Plan was to follow up in 6 months.  She called in today with complaints of rapid palpitations and was added onto my schedule.  She has had feelings of rapid heartbeat over the last several weeks.  This has been fairly constant since mid-December.  She has decreased energy.  She noticed increased shortness of breath with exertion.  She describes class 2b-3 symptoms.  She denies orthopnea, PND or significant edema.  She denies chest pain.  She denies syncope.  Labs from Dr. Laurey Morale office 05/14/11: Hgb 13.6, creatinine 1.4, K 5.2, TC 195, TG 68, HDL 73, LDL 108, TSH 2.72, ALT 34.  Past Medical History  Diagnosis Date  . HTN (hypertension)   . A-fib   . Dyslipidemia   . Hypothyroidism   . Stroke     Current Outpatient Prescriptions  Medication Sig Dispense Refill  . albuterol (PROVENTIL,VENTOLIN) 90 MCG/ACT inhaler Inhale 2 puffs into the lungs every 6 (six) hours as needed.        . ALPRAZolam (XANAX) 0.25 MG tablet Take 0.25 mg by mouth as needed.        Marland Kitchen amLODipine (NORVASC) 10 MG tablet Take 10 mg by mouth daily.        Marland Kitchen  atorvastatin (LIPITOR) 10 MG tablet 20 mg. Pt takes a half of a 20 mg tab every other day      . Fluticasone-Salmeterol (ADVAIR DISKUS) 250-50 MCG/DOSE AEPB Inhale 1 puff into the lungs 2 (two) times daily.       Marland Kitchen levothyroxine (SYNTHROID, LEVOTHROID) 25 MCG tablet Take 25 mcg by mouth daily.        Marland Kitchen losartan (COZAAR) 100 MG tablet Take 100 mg by mouth daily.        . metoprolol (TOPROL-XL) 100 MG 24 hr tablet Take 100 mg by mouth daily.        . propafenone (RYTHMOL) 150 MG tablet Take 150 mg by mouth 3 (three) times daily.        . temazepam (RESTORIL) 15 MG capsule Take 15 mg by mouth at bedtime.        Marland Kitchen tiotropium (SPIRIVA) 18 MCG inhalation capsule Place 18 mcg into inhaler and inhale daily.        Marland Kitchen warfarin (COUMADIN) 4 MG tablet Take 4 mg by mouth as directed.          Allergies: Allergies  Allergen Reactions  . Ciprofloxacin   . Quinolones   . Sulfonamide Derivatives     History  Substance Use Topics  . Smoking status: Never Smoker   . Smokeless tobacco:  Not on file   Comment: does not smoke   . Alcohol Use: No     ROS:  Please see the history of present illness.     All other systems reviewed and negative.   PHYSICAL EXAM: VS:  BP 115/69  Pulse 108  Ht 5' (1.524 m)  Wt 118 lb (53.524 kg)  BMI 23.05 kg/m2 Well nourished, well developed, in no acute distress HEENT: normal Neck: no JVD Cardiac:  normal S1, S2; rapid RRR; no murmur Lungs:  Decreased breath sounds bilaterally, no wheezing, rhonchi or rales Abd: soft, nontender, no hepatomegaly Ext: no edema Skin: warm and dry Neuro:  CNs 2-12 intact, no focal abnormalities noted Psych: normal affect  EKG:   Atrial flutter, heart rate 109, left axis deviation, incomplete right bundle branch block  ASSESSMENT AND PLAN:

## 2011-06-03 NOTE — Telephone Encounter (Signed)
May need to get from Dr Waynard Edwards but will need a release of information signed.  Will determine if needed once pt gets here

## 2011-06-03 NOTE — Assessment & Plan Note (Signed)
This is likely related to her COPD and rapid rate.  I do not think she is volume overloaded.  I will check a CXR, BMET and BNP level.

## 2011-06-03 NOTE — Telephone Encounter (Signed)
Per daughter - pt has been feeling worse since Christmas.  Her HR is rapid (100 - 101) and irregular and pt is wheezing.  Daughter wants pt to be seen ASAP.  Appointment given for today at 2:30 pm with Lilian Coma.

## 2011-06-03 NOTE — Patient Instructions (Addendum)
Your physician recommends that you return for lab work in: today (BMET, BNP, Propafenone level)  A chest x-ray takes a picture of the organs and structures inside the chest, including the heart, lungs, and blood vessels. This test can show several things, including, whether the heart is enlarges; whether fluid is building up in the lungs; and whether pacemaker / defibrillator leads are still in place.  Your physician recommends that you schedule a follow-up appointment in: 2 weeks (Dr Antoine Poche)  06/19/11 at 1:45pm in [place]

## 2011-06-03 NOTE — Assessment & Plan Note (Signed)
Controlled.  Continue current therapy.  

## 2011-06-03 NOTE — Telephone Encounter (Signed)
New Msg: pt daughter calling wanting to tell nurse that pt had EKG and blood work done in Dr. Laurey Morale office on 05/07/11. Pt daughter didn't know if the results of EKG and lab work would be helpful to Dr. Antoine Poche. Please return pt daughter call to discuss further if necessary.

## 2011-06-04 ENCOUNTER — Other Ambulatory Visit: Payer: Self-pay

## 2011-06-04 NOTE — Progress Notes (Signed)
Labs received from Dr Perini's office.  [facility] was notified of INR results and they will discuss dose with pt.  They were also told to check INRs weekly in case of cardioversion.

## 2011-06-06 ENCOUNTER — Telehealth: Payer: Self-pay | Admitting: Cardiology

## 2011-06-06 NOTE — Telephone Encounter (Signed)
Julie Rich daughter called to report that Julie Rich started having pressure and pounding in her heart.  She called 911. The paramedics said she was in sinus rhythm and her vitals were normal.  This am she had the same symptoms and it lasted about 45 min.  Her heart is still beating around 100 but the pressure and pounding is gone.  She is asking for advice on what to do and why this is happening.  She also wants to know if it would be ok to take her xanax 0.125mg  or 0.25mg  that she has prn for anxiety.  She is also wondering if the lab results came back from her pcp's office.

## 2011-06-06 NOTE — Telephone Encounter (Signed)
FU Call: Pt daughter returning call to [employer]. Please return pt call to discuss further.

## 2011-06-06 NOTE — Telephone Encounter (Signed)
Daughter asking to speak with Hubert Azure.  Will forward to her to see if see knows what she is talking about or needing.

## 2011-06-07 NOTE — Telephone Encounter (Signed)
Propafenone level that was drawn at [facility] on 06/04/11 is still pending. BNP 419.9. BMP BUN 19 Crea 1.28. Per [facility] they do not know when the results will be in.

## 2011-06-07 NOTE — Telephone Encounter (Signed)
Left message for Julie Rich that we have not received all of the lab results.  Requested she call Doctor on Call this weekend if she has troubles.

## 2011-06-08 ENCOUNTER — Telehealth: Payer: Self-pay | Admitting: Adult Health

## 2011-06-08 NOTE — Telephone Encounter (Signed)
Received call from patient's daughter Lora Havens. She was concerned about her mother's persistent shortness of breath and tachycardia. The patient had been seen in the office on Monday 1 11/26/2011 and evaluated by Tereso Newcomer physician assistant with discussion with Dr. Ivette Loyal run-on site. The patient was given reassurance lab work was drawn to include Rythmol level chest x-ray BNP. The patient was found to be within normal limits with the exception of the Rythmol level which was not available.  The patient remains quite anxious and complaining of shortness of breath and tachycardia. She call EMS last night and was found to be in normal rhythm but quite anxious. She was advised to take the Xanax that was prescribed for her and she had not taken yet. She called her daughter last night saying that she was still feeling bad very anxious and short of breath. Her daughter cannot tell if she is having anxiety attack, is afraid, or is actually having a cardiac event.  She is asked for by husband concerning the situation. After speaking with the daughter at length I have suggested that she be seen in the medicine clinic in [place] open right now for evaluation and need to transfer tone. The daughters living in [place] and her mother is in [place]. She states that the EMS, and Heather Dr. Jenene Slicker nurse advised against coming to the emergency room at the time of the phone call to give results of testing., 2 days ago. She is reluctant to bring her mother to the emergency room secondary to this advisement. To have her evaluated but not in the ER I suggested that she be seen in the clinic in [place] if they find it to be necessary that she needed to be seen in [facility] emergency room they would call us and transfer her. The daughter verbalizes understanding and will advise mother to be seen at the [facility] clinic. The other choice would be for her to come to [facility] emergency room although there is some trepidation  concerning that. She was told that with the flu outbreak in the normal virus it would not be a good idea to have her round mass in the ER if it is a necessary for her to be seen.

## 2011-06-09 ENCOUNTER — Encounter (HOSPITAL_COMMUNITY): Payer: Self-pay

## 2011-06-09 ENCOUNTER — Emergency Department (HOSPITAL_COMMUNITY): Payer: Medicare Other

## 2011-06-09 ENCOUNTER — Inpatient Hospital Stay (HOSPITAL_COMMUNITY)
Admission: EM | Admit: 2011-06-09 | Discharge: 2011-06-14 | DRG: 292 | Disposition: A | Discharge status: Home / Self Care | Payer: Medicare Other | Attending: Cardiology | Admitting: Cardiology

## 2011-06-09 ENCOUNTER — Other Ambulatory Visit: Payer: Self-pay

## 2011-06-09 DIAGNOSIS — J449 Chronic obstructive pulmonary disease, unspecified: Secondary | ICD-10-CM | POA: Diagnosis present

## 2011-06-09 DIAGNOSIS — I5033 Acute on chronic diastolic (congestive) heart failure: Principal | ICD-10-CM | POA: Diagnosis present

## 2011-06-09 DIAGNOSIS — J4489 Other specified chronic obstructive pulmonary disease: Secondary | ICD-10-CM | POA: Diagnosis present

## 2011-06-09 DIAGNOSIS — I4891 Unspecified atrial fibrillation: Secondary | ICD-10-CM | POA: Diagnosis present

## 2011-06-09 DIAGNOSIS — I10 Essential (primary) hypertension: Secondary | ICD-10-CM | POA: Insufficient documentation

## 2011-06-09 DIAGNOSIS — I509 Heart failure, unspecified: Secondary | ICD-10-CM | POA: Diagnosis present

## 2011-06-09 DIAGNOSIS — R0602 Shortness of breath: Secondary | ICD-10-CM

## 2011-06-09 DIAGNOSIS — E785 Hyperlipidemia, unspecified: Secondary | ICD-10-CM | POA: Insufficient documentation

## 2011-06-09 DIAGNOSIS — N390 Urinary tract infection, site not specified: Secondary | ICD-10-CM | POA: Diagnosis present

## 2011-06-09 DIAGNOSIS — N179 Acute kidney failure, unspecified: Secondary | ICD-10-CM | POA: Diagnosis present

## 2011-06-09 DIAGNOSIS — I4892 Unspecified atrial flutter: Secondary | ICD-10-CM | POA: Insufficient documentation

## 2011-06-09 HISTORY — DX: Shortness of breath: R06.02

## 2011-06-09 HISTORY — DX: Anxiety disorder, unspecified: F41.9

## 2011-06-09 HISTORY — DX: Gastro-esophageal reflux disease without esophagitis: K21.9

## 2011-06-09 HISTORY — DX: Heart failure, unspecified: I50.9

## 2011-06-09 HISTORY — DX: Chronic obstructive pulmonary disease, unspecified: J44.9

## 2011-06-09 LAB — CBC
Hemoglobin: 13.4 g/dL (ref 12.0–15.0)
MCH: 30.3 pg (ref 26.0–34.0)
MCV: 92.1 fL (ref 78.0–100.0)
RBC: 4.42 MIL/uL (ref 3.87–5.11)

## 2011-06-09 LAB — URINALYSIS, ROUTINE W REFLEX MICROSCOPIC
Ketones, ur: NEGATIVE mg/dL
Nitrite: POSITIVE — AB
Specific Gravity, Urine: 1.012 (ref 1.005–1.030)
Urobilinogen, UA: 0.2 mg/dL (ref 0.0–1.0)
pH: 5 (ref 5.0–8.0)

## 2011-06-09 LAB — COMPREHENSIVE METABOLIC PANEL
Alkaline Phosphatase: 91 U/L (ref 39–117)
BUN: 23 mg/dL (ref 6–23)
Calcium: 9.5 mg/dL (ref 8.4–10.5)
GFR calc Af Amer: 34 mL/min — ABNORMAL LOW (ref >90)
Glucose, Bld: 116 mg/dL — ABNORMAL HIGH (ref 70–99)
Total Protein: 7 g/dL (ref 6.0–8.3)

## 2011-06-09 LAB — APTT: aPTT: 40 seconds — ABNORMAL HIGH (ref 24–37)

## 2011-06-09 LAB — DIFFERENTIAL
Eosinophils Absolute: 0.1 10*3/uL (ref 0.0–0.7)
Eosinophils Relative: 1 % (ref 0–5)
Lymphs Abs: 1 10*3/uL (ref 0.7–4.0)
Monocytes Relative: 11 % (ref 3–12)

## 2011-06-09 LAB — CARDIAC PANEL(CRET KIN+CKTOT+MB+TROPI)
CK, MB: 3.2 ng/mL (ref 0.3–4.0)
Relative Index: INVALID (ref 0.0–2.5)
Total CK: 57 U/L (ref 7–177)
Troponin I: <0.3 ng/mL (ref <0.30)

## 2011-06-09 LAB — URINE MICROSCOPIC-ADD ON

## 2011-06-09 LAB — URINE CULTURE: Culture  Setup Time: 201301132047

## 2011-06-09 MED ORDER — FLUTICASONE-SALMETEROL 250-50 MCG/DOSE IN AEPB
1.0000 | INHALATION_SPRAY | Freq: Two times a day (BID) | RESPIRATORY_TRACT | Status: DC
  Administered 2011-06-10 – 2011-06-14 (×10): 1 via RESPIRATORY_TRACT
  Filled 2011-06-09: qty 14

## 2011-06-09 MED ORDER — NITROGLYCERIN 2 % TD OINT
1.0000 [in_us] | TOPICAL_OINTMENT | Freq: Once | TRANSDERMAL | Status: DC
  Filled 2011-06-09 (×3): qty 1

## 2011-06-09 MED ORDER — LOSARTAN POTASSIUM 50 MG PO TABS
100.0000 mg | ORAL_TABLET | Freq: Every day | ORAL | Status: DC
  Administered 2011-06-10 – 2011-06-14 (×5): 100 mg via ORAL
  Filled 2011-06-09 (×5): qty 2

## 2011-06-09 MED ORDER — ACETAMINOPHEN 325 MG PO TABS: 650.0000 mg | ORAL_TABLET | ORAL | Status: DC | PRN

## 2011-06-09 MED ORDER — ZOLPIDEM TARTRATE 5 MG PO TABS
5.0000 mg | ORAL_TABLET | Freq: Every day | ORAL | Status: DC
  Administered 2011-06-09 – 2011-06-11 (×3): 5 mg via ORAL
  Filled 2011-06-09 (×3): qty 1

## 2011-06-09 MED ORDER — TIOTROPIUM BROMIDE MONOHYDRATE 18 MCG IN CAPS
18.0000 ug | ORAL_CAPSULE | Freq: Every day | RESPIRATORY_TRACT | Status: DC
  Administered 2011-06-10 – 2011-06-14 (×5): 18 ug via RESPIRATORY_TRACT
  Filled 2011-06-09 (×2): qty 5

## 2011-06-09 MED ORDER — SODIUM CHLORIDE 0.9 % IJ SOLN
3.0000 mL | Freq: Two times a day (BID) | INTRAMUSCULAR | Status: DC
  Administered 2011-06-09 – 2011-06-14 (×6): 3 mL via INTRAVENOUS

## 2011-06-09 MED ORDER — ALBUTEROL SULFATE HFA 108 (90 BASE) MCG/ACT IN AERS
2.0000 | INHALATION_SPRAY | Freq: Four times a day (QID) | RESPIRATORY_TRACT | Status: DC | PRN
  Filled 2011-06-09: qty 6.7

## 2011-06-09 MED ORDER — PROPAFENONE HCL 225 MG PO TABS
225.0000 mg | ORAL_TABLET | Freq: Three times a day (TID) | ORAL | Status: DC
  Administered 2011-06-09 – 2011-06-14 (×15): 225 mg via ORAL
  Filled 2011-06-09 (×18): qty 1

## 2011-06-09 MED ORDER — SODIUM CHLORIDE 0.9 % IV SOLN
INTRAVENOUS | Status: DC
  Administered 2011-06-09: 17:00:00 via INTRAVENOUS

## 2011-06-09 MED ORDER — AMLODIPINE BESYLATE 10 MG PO TABS
10.0000 mg | ORAL_TABLET | Freq: Every day | ORAL | Status: DC
  Administered 2011-06-10 – 2011-06-14 (×5): 10 mg via ORAL
  Filled 2011-06-09 (×5): qty 1

## 2011-06-09 MED ORDER — FUROSEMIDE 10 MG/ML IJ SOLN
40.0000 mg | Freq: Once | INTRAMUSCULAR | Status: AC
  Administered 2011-06-09: 40 mg via INTRAVENOUS
  Filled 2011-06-09: qty 4

## 2011-06-09 MED ORDER — WARFARIN SODIUM 4 MG PO TABS
4.0000 mg | ORAL_TABLET | Freq: Every day | ORAL | Status: DC
  Administered 2011-06-10 – 2011-06-12 (×3): 4 mg via ORAL
  Filled 2011-06-09 (×4): qty 1

## 2011-06-09 MED ORDER — SIMVASTATIN 20 MG PO TABS
20.0000 mg | ORAL_TABLET | Freq: Every day | ORAL | Status: DC
  Administered 2011-06-10 – 2011-06-13 (×4): 20 mg via ORAL
  Filled 2011-06-09 (×5): qty 1

## 2011-06-09 MED ORDER — LEVOTHYROXINE SODIUM 25 MCG PO TABS
25.0000 ug | ORAL_TABLET | Freq: Every day | ORAL | Status: DC
  Administered 2011-06-10 – 2011-06-14 (×5): 25 ug via ORAL
  Filled 2011-06-09 (×6): qty 1

## 2011-06-09 MED ORDER — FUROSEMIDE 10 MG/ML IJ SOLN
40.0000 mg | Freq: Two times a day (BID) | INTRAMUSCULAR | Status: DC
  Administered 2011-06-10: 40 mg via INTRAVENOUS
  Filled 2011-06-09 (×3): qty 4

## 2011-06-09 MED ORDER — ONDANSETRON HCL 4 MG/2ML IJ SOLN: 4.0000 mg | Freq: Four times a day (QID) | INTRAMUSCULAR | Status: DC | PRN

## 2011-06-09 MED ORDER — SODIUM CHLORIDE 0.9 % IJ SOLN
3.0000 mL | INTRAMUSCULAR | Status: DC | PRN
  Administered 2011-06-13: 3 mL via INTRAVENOUS

## 2011-06-09 MED ORDER — ALPRAZOLAM 0.25 MG PO TABS: 0.2500 mg | ORAL_TABLET | Freq: Three times a day (TID) | ORAL | Status: DC | PRN

## 2011-06-09 MED ORDER — SODIUM CHLORIDE 0.9 % IV SOLN: 250.0000 mL | INTRAVENOUS | Status: DC | PRN

## 2011-06-09 MED ORDER — TEMAZEPAM 15 MG PO CAPS: 15.0000 mg | ORAL_CAPSULE | Freq: Every day | ORAL | Status: DC

## 2011-06-09 MED ORDER — METOPROLOL SUCCINATE ER 100 MG PO TB24
100.0000 mg | ORAL_TABLET | Freq: Every day | ORAL | Status: DC
  Administered 2011-06-10 – 2011-06-14 (×5): 100 mg via ORAL
  Filled 2011-06-09 (×5): qty 1

## 2011-06-09 NOTE — ED Notes (Signed)
Per ems- pt dx with CHF Saturday. Pt became very SOB this am. Pt WOB was increased, but has since decreased while w/ EMS. Pt in NAD at this time.

## 2011-06-09 NOTE — ED Notes (Signed)
Pt called this RN into room to report a "tightness" in her chest. Denies pain.

## 2011-06-09 NOTE — ED Provider Notes (Signed)
History     CSN: [id number]  Arrival date & time 06/09/11  1532   First MD Initiated Contact with Patient 06/09/11 1534      Chief Complaint  Patient presents with  . Shortness of Breath    (Consider location/radiation/quality/duration/timing/severity/associated sxs/prior treatment) Patient is a 78 y.o. female presenting with shortness of breath. The history is provided by the patient and medical records. No language interpreter was used.  Shortness of Breath  The current episode started more than 1 week ago. The problem occurs frequently. The problem has been gradually worsening. The problem is moderate. The symptoms are relieved by nothing. The symptoms are aggravated by nothing. Associated symptoms include chest pressure and shortness of breath. Recently, medical care has been given at another facility (Patient was seen yesterday at [facility] family practice. She was prescribed Lasix for her congestive heart failure.). Services received include medications given.    Past Medical History  Diagnosis Date  . HTN (hypertension)   . A-fib   . Dyslipidemia   . Hypothyroidism   . Stroke     Past Surgical History  Procedure Date  . Total abdominal hysterectomy     Family History  Problem Relation Age of Onset  . Coronary artery disease Neg Hx   . Stroke Brother   . Breast cancer Mother     History  Substance Use Topics  . Smoking status: Never Smoker   . Smokeless tobacco: Not on file   Comment: does not smoke   . Alcohol Use: No    OB History    Grav Para Term Preterm Abortions TAB SAB Ect Mult Living                  Review of Systems  Constitutional: Negative.   HENT: Negative.   Eyes: Negative.   Respiratory: Positive for chest tightness and shortness of breath.   Cardiovascular: Positive for palpitations.  Gastrointestinal: Negative.   Genitourinary: Negative.   Musculoskeletal: Negative.   Skin: Negative.   Neurological: Negative.     Psychiatric/Behavioral: Negative.     Allergies  Ciprofloxacin; Quinolones; and Sulfonamide derivatives  Home Medications   Current Outpatient Rx  Name Route Sig Dispense Refill  . ALBUTEROL 90 MCG/ACT IN AERS Inhalation Inhale 2 puffs into the lungs every 6 (six) hours as needed.      . ALPRAZOLAM 0.25 MG PO TABS Oral Take 0.25 mg by mouth as needed.      Marland Kitchen AMLODIPINE BESYLATE 10 MG PO TABS Oral Take 10 mg by mouth daily.      . ATORVASTATIN CALCIUM 10 MG PO TABS  20 mg. Pt takes a half of a 20 mg tab every other day    . FLUTICASONE-SALMETEROL 250-50 MCG/DOSE IN AEPB Inhalation Inhale 1 puff into the lungs 2 (two) times daily.     Marland Kitchen LEVOTHYROXINE SODIUM 25 MCG PO TABS Oral Take 25 mcg by mouth daily.      Marland Kitchen LOSARTAN POTASSIUM 100 MG PO TABS Oral Take 100 mg by mouth daily.      Marland Kitchen METOPROLOL SUCCINATE ER 100 MG PO TB24 Oral Take 100 mg by mouth daily.      Marland Kitchen PROPAFENONE HCL 150 MG PO TABS Oral Take 150 mg by mouth 3 (three) times daily.      Marland Kitchen TEMAZEPAM 15 MG PO CAPS Oral Take 15 mg by mouth at bedtime.      Marland Kitchen TIOTROPIUM BROMIDE MONOHYDRATE 18 MCG IN CAPS Inhalation Place 18 mcg  into inhaler and inhale daily.      . WARFARIN SODIUM 4 MG PO TABS Oral Take 4 mg by mouth as directed.        BP 123/66  Pulse 108  Resp 20  SpO2 99%  Physical Exam  Constitutional: She is oriented to person, place, and time.       Patient is a slender elderly lady in no distress at rest.  HENT:  Head: Normocephalic and atraumatic.  Right Ear: External ear normal.  Left Ear: External ear normal.  Mouth/Throat: Oropharynx is clear and moist.  Eyes: Conjunctivae and EOM are normal. Pupils are equal, round, and reactive to light.  Neck: Normal range of motion. Neck supple. No JVD present.  Cardiovascular: Normal rate and normal heart sounds.        She has an irregular heart rhythm.  Pulmonary/Chest: Effort normal.       Has faint rales at the bases.  Abdominal: Soft. Bowel sounds are normal.   Musculoskeletal: Normal range of motion. She exhibits no edema.  Neurological: She is alert and oriented to person, place, and time.       No sensory or motor deficit.  Skin: Skin is warm and dry.  Psychiatric: She has a normal mood and affect. Her behavior is normal.    ED Course  Procedures (including critical care time)   Labs Reviewed  CBC  DIFFERENTIAL  COMPREHENSIVE METABOLIC PANEL  URINALYSIS, ROUTINE W REFLEX MICROSCOPIC  URINE CULTURE  CARDIAC PANEL(CRET KIN+CKTOT+MB+TROPI)  PRO B NATRIURETIC PEPTIDE  D-DIMER, QUANTITATIVE  APTT  PROTIME-INR   3:55 PM Patient was seen and had physical examination. Laboratory tests were ordered. Old charts were reviewed.  4:00 PM  Date: 06/09/2011  Rate: 92  Rhythm: atrial fibrillation  QRS Axis: left  Intervals: normal QRS:  Poor R wave progression in precordial leads suggests old anterior myocardial infarction.  ST/T Wave abnormalities: nonspecific ST/T changes  Conduction Disutrbances:none  Narrative Interpretation: Abnormal EKG  Old EKG Reviewed: changes noted--Was in sinus rhythm on EKG of 01/31/2010.  6:32 PM Results for orders placed during the hospital encounter of 06/09/11  CBC      Component Value Range   WBC 5.6  4.0 - 10.5 (K/uL)   RBC 4.42  3.87 - 5.11 (MIL/uL)   Hemoglobin 13.4  12.0 - 15.0 (g/dL)   HCT 16.1  09.6 - 04.5 (%)   MCV 92.1  78.0 - 100.0 (fL)   MCH 30.3  26.0 - 34.0 (pg)   MCHC 32.9  30.0 - 36.0 (g/dL)   RDW 40.9  81.1 - 91.4 (%)   Platelets 233  150 - 400 (K/uL)  DIFFERENTIAL      Component Value Range   Neutrophils Relative 69  43 - 77 (%)   Neutro Abs 3.9  1.7 - 7.7 (K/uL)   Lymphocytes Relative 18  12 - 46 (%)   Lymphs Abs 1.0  0.7 - 4.0 (K/uL)   Monocytes Relative 11  3 - 12 (%)   Monocytes Absolute 0.6  0.1 - 1.0 (K/uL)   Eosinophils Relative 1  0 - 5 (%)   Eosinophils Absolute 0.1  0.0 - 0.7 (K/uL)   Basophils Relative 1  0 - 1 (%)   Basophils Absolute 0.1  0.0 - 0.1 (K/uL)   COMPREHENSIVE METABOLIC PANEL      Component Value Range   Sodium 142  135 - 145 (mEq/L)   Potassium 4.5  3.5 - 5.1 (mEq/L)   Chloride  104  96 - 112 (mEq/L)   CO2 29  19 - 32 (mEq/L)   Glucose, Bld 116 (*) 70 - 99 (mg/dL)   BUN 23  6 - 23 (mg/dL)   Creatinine, Ser 4.09 (*) 0.50 - 1.10 (mg/dL)   Calcium 9.5  8.4 - 81.1 (mg/dL)   Total Protein 7.0  6.0 - 8.3 (g/dL)   Albumin 3.6  3.5 - 5.2 (g/dL)   AST 19  0 - 37 (U/L)   ALT 30  0 - 35 (U/L)   Alkaline Phosphatase 91  39 - 117 (U/L)   Total Bilirubin 0.3  0.3 - 1.2 (mg/dL)   GFR calc non Af Amer 29 (*) >90 (mL/min)   GFR calc Af Amer 34 (*) >90 (mL/min)  URINALYSIS, ROUTINE W REFLEX MICROSCOPIC      Component Value Range   Color, Urine YELLOW  YELLOW    APPearance CLOUDY (*) CLEAR    Specific Gravity, Urine 1.012  1.005 - 1.030    pH 5.0  5.0 - 8.0    Glucose, UA NEGATIVE  NEGATIVE (mg/dL)   Hgb urine dipstick NEGATIVE  NEGATIVE    Bilirubin Urine NEGATIVE  NEGATIVE    Ketones, ur NEGATIVE  NEGATIVE (mg/dL)   Protein, ur NEGATIVE  NEGATIVE (mg/dL)   Urobilinogen, UA 0.2  0.0 - 1.0 (mg/dL)   Nitrite POSITIVE (*) NEGATIVE    Leukocytes, UA MODERATE (*) NEGATIVE   CARDIAC PANEL(CRET KIN+CKTOT+MB+TROPI)      Component Value Range   Total CK 57  7 - 177 (U/L)   CK, MB 3.2  0.3 - 4.0 (ng/mL)   Troponin I <0.30  <0.30 (ng/mL)   Relative Index RELATIVE INDEX IS INVALID  0.0 - 2.5   PRO B NATRIURETIC PEPTIDE      Component Value Range   Pro B Natriuretic peptide (BNP) 888.9 (*) 0 - 450 (pg/mL)  D-DIMER, QUANTITATIVE      Component Value Range   D-Dimer, Quant 0.51 (*) 0.00 - 0.48 (ug/mL-FEU)  APTT      Component Value Range   aPTT 40 (*) 24 - 37 (seconds)  PROTIME-INR      Component Value Range   Prothrombin Time 27.2 (*) 11.6 - 15.2 (seconds)   INR 2.47 (*) 0.00 - 1.49   URINE MICROSCOPIC-ADD ON      Component Value Range   Squamous Epithelial / LPF FEW (*) RARE    WBC, UA 7-10  <3 (WBC/hpf)   Bacteria, UA MANY (*) RARE     Casts HYALINE CASTS (*) NEGATIVE    Dg Chest 2 View  06/09/2011  *RADIOLOGY REPORT*  Clinical Data: Shortness of breath.  History of atrial fibrillation.  CHF.  Cough.  CHEST - 2 VIEW  Comparison: 01/31/2010  Findings: The heart is enlarged.  There are bilateral pleural effusions.  More focal density is seen at the lung bases, right greater than left, consistent with edema and/or infection.  IMPRESSION:  1.  Bilateral effusions and bibasilar opacities right greater than left. 2.  Cardiomegaly.  Possible edema.  Original Report Authenticated By: Patterson Hammersmith, M.D.   6:34 PM Lab tests suggest CHF.  EKG shows Atrial Fibrillation; her INR is therapeutic.  Will call [facility] to see her.     1. Congestive heart failure   2. Atrial fibrillation            Carleene Cooper III, MD 06/11/11 1313

## 2011-06-09 NOTE — H&P (Signed)
PCP: Perini  Primary Cardiologist: Hochrein  CC: Shortness of breath  HPI: 78 year old white female with a past medical history significant for diastolic heart failure, paroxysmal atrial fibrillation on chronic anticoagulation, and hypertension, who presents for evaluation of progressive fatigue and shortness of breath. She has not felt well for the past 2 months. However, over the past one to 2 weeks has had a progressive decline. She notes that she's been having epigastric pains, as well as high heart rates with heart rates in the 90s to 110s. She also had progressive weakness and dyspnea on exertion. She states that whenever she walks 10-15 feet in her house, she becomes extremely short of breath. She denies any weight change, orthopnea, PND, or syncope. She denies any chest pain.  Because of these symptoms, she saw [facility] cardiology on January 7. At that time, she was noted to be in atypical atrial flutter at 109 beats per minute. While she does have a history of paroxysmal atrial fibrillation, this was a new finding for her as her most recent EKG from November demonstrated sinus rhythm. At that visit, laboratory studies were obtained, and the patient was started on Lasix 20 mg every other day. She's taken 2 doses of his medications, but this has not improved her symptoms. The patient does have worsening of her shortness of breath, and she came to the emergency department for evaluation.  In the ER, it was felt that she was volume overloaded, and she received 40 mg of Lasix and cardiology was consult.  The only other pertinent thing is that is that 3 weeks ago, she was treated successfully for urinary tract infection. She does note, that she's been compliant with her Coumadin, and her levels are checked in [place].     Past Medical History  Diagnosis Date  . HTN (hypertension)   . A-fib   . Dyslipidemia   . Hypothyroidism   . Stroke   Also notable for: 1. Chronic Diastolic Heart failure  (TTE normal EF 2008).  2. Chronic anticoagulation with coumadin.   History   Social History  . Marital Status: Divorced    Spouse Name: N/A    Number of Children: N/A  . Years of Education: N/A   Occupational History  . retired    Social History Main Topics  . Smoking status: Never Smoker   . Smokeless tobacco: Not on file   Comment: does not smoke   . Alcohol Use: No  . Drug Use: No  . Sexually Active: Not on file   Other Topics Concern  . Not on file   Social History Narrative   Lives alone, has a daughter fairly close by, does not drink a lot of caffeine.     Family History  Problem Relation Age of Onset  . Coronary artery disease Neg Hx   . Stroke Brother   . Breast cancer Mother     Medications Prior to Admission  Medication Dose Route Frequency Provider Last Rate Last Dose  . 0.9 %  sodium chloride infusion   Intravenous Continuous Carleene Cooper III, MD 125 mL/hr at 06/09/11 1630    . furosemide (LASIX) injection 40 mg  40 mg Intravenous Once Carleene Cooper III, MD   40 mg at 06/09/11 1914  . nitroGLYCERIN (NITROGLYN) 2 % ointment 1 inch  1 inch Topical Once Carleene Cooper III, MD       Medications Prior to Admission  Medication Sig Dispense Refill  . albuterol (PROVENTIL,VENTOLIN) 90 MCG/ACT inhaler Inhale  2 puffs into the lungs every 6 (six) hours as needed. For shortness of breath      . ALPRAZolam (XANAX) 0.25 MG tablet Take 0.25 mg by mouth as needed. For anxiety      . amLODipine (NORVASC) 10 MG tablet Take 10 mg by mouth daily.        Marland Kitchen atorvastatin (LIPITOR) 10 MG tablet 20 mg. Pt takes a half of a 20 mg tab every other day      . Fluticasone-Salmeterol (ADVAIR DISKUS) 250-50 MCG/DOSE AEPB Inhale 1 puff into the lungs 2 (two) times daily.       Marland Kitchen levothyroxine (SYNTHROID, LEVOTHROID) 25 MCG tablet Take 25 mcg by mouth daily.        Marland Kitchen losartan (COZAAR) 100 MG tablet Take 100 mg by mouth daily.        . metoprolol (TOPROL-XL) 100 MG 24 hr tablet Take 100  mg by mouth daily.        . propafenone (RYTHMOL) 150 MG tablet Take 150 mg by mouth 3 (three) times daily.        . temazepam (RESTORIL) 15 MG capsule Take 15 mg by mouth at bedtime.        Marland Kitchen tiotropium (SPIRIVA) 18 MCG inhalation capsule Place 18 mcg into inhaler and inhale daily.        Marland Kitchen warfarin (COUMADIN) 4 MG tablet Take 4 mg by mouth as directed.          Allergies  Allergen Reactions  . Ciprofloxacin     unknown  . Quinolones     unknown  . Sulfonamide Derivatives     unknown    Review of Systems: All systems reviewed and are negative except as mentioned above in the history of present illness.    PHYSICAL EXAM: Filed Vitals:   06/09/11 1834  BP: 106/62  Pulse: 96  Temp:   Resp: 23   GENERAL: No acute distress.   HEENT: Normocephalic, atraumatic.  Oropharynx is pink and moist without lesions.  NECK: Supple, no LAD, JVD 5cm above clavicle, no masses. CV: Irreg Irreg with HR in the 90-100s.   CHEST: Faint bibasilar crackles   ABDOMEN: +BS, soft, nontender, nondistended.  EXTREMITIES: No clubbing, cyanosis, or edema.   NEURO: AO x 3, no focal deficits. PYSCH: Normal affect. SKIN: No rashes.    ECG: 1553 - Atrial tach vs. Atypical atrial flutter with atrial rate of 220; variable A-V conduction with ventricular rate of 110.   Results for orders placed during the hospital encounter of 06/09/11 (from the past 24 hour(s))  CBC     Status: Normal   Collection Time   06/09/11  3:54 PM      Component Value Range   WBC 5.6  4.0 - 10.5 (K/uL)   RBC 4.42  3.87 - 5.11 (MIL/uL)   Hemoglobin 13.4  12.0 - 15.0 (g/dL)   HCT 16.1  09.6 - 04.5 (%)   MCV 92.1  78.0 - 100.0 (fL)   MCH 30.3  26.0 - 34.0 (pg)   MCHC 32.9  30.0 - 36.0 (g/dL)   RDW 40.9  81.1 - 91.4 (%)   Platelets 233  150 - 400 (K/uL)  DIFFERENTIAL     Status: Normal   Collection Time   06/09/11  3:54 PM      Component Value Range   Neutrophils Relative 69  43 - 77 (%)   Neutro Abs 3.9  1.7 - 7.7 (K/uL)    Lymphocytes  Relative 18  12 - 46 (%)   Lymphs Abs 1.0  0.7 - 4.0 (K/uL)   Monocytes Relative 11  3 - 12 (%)   Monocytes Absolute 0.6  0.1 - 1.0 (K/uL)   Eosinophils Relative 1  0 - 5 (%)   Eosinophils Absolute 0.1  0.0 - 0.7 (K/uL)   Basophils Relative 1  0 - 1 (%)   Basophils Absolute 0.1  0.0 - 0.1 (K/uL)  COMPREHENSIVE METABOLIC PANEL     Status: Abnormal   Collection Time   06/09/11  3:54 PM      Component Value Range   Sodium 142  135 - 145 (mEq/L)   Potassium 4.5  3.5 - 5.1 (mEq/L)   Chloride 104  96 - 112 (mEq/L)   CO2 29  19 - 32 (mEq/L)   Glucose, Bld 116 (*) 70 - 99 (mg/dL)   BUN 23  6 - 23 (mg/dL)   Creatinine, Ser 9.60 (*) 0.50 - 1.10 (mg/dL)   Calcium 9.5  8.4 - 45.4 (mg/dL)   Total Protein 7.0  6.0 - 8.3 (g/dL)   Albumin 3.6  3.5 - 5.2 (g/dL)   AST 19  0 - 37 (U/L)   ALT 30  0 - 35 (U/L)   Alkaline Phosphatase 91  39 - 117 (U/L)   Total Bilirubin 0.3  0.3 - 1.2 (mg/dL)   GFR calc non Af Amer 29 (*) >90 (mL/min)   GFR calc Af Amer 34 (*) >90 (mL/min)  CARDIAC PANEL(CRET KIN+CKTOT+MB+TROPI)     Status: Normal   Collection Time   06/09/11  3:54 PM      Component Value Range   Total CK 57  7 - 177 (U/L)   CK, MB 3.2  0.3 - 4.0 (ng/mL)   Troponin I <0.30  <0.30 (ng/mL)   Relative Index RELATIVE INDEX IS INVALID  0.0 - 2.5   D-DIMER, QUANTITATIVE     Status: Abnormal   Collection Time   06/09/11  3:54 PM      Component Value Range   D-Dimer, Quant 0.51 (*) 0.00 - 0.48 (ug/mL-FEU)  APTT     Status: Abnormal   Collection Time   06/09/11  3:54 PM      Component Value Range   aPTT 40 (*) 24 - 37 (seconds)  PROTIME-INR     Status: Abnormal   Collection Time   06/09/11  3:54 PM      Component Value Range   Prothrombin Time 27.2 (*) 11.6 - 15.2 (seconds)   INR 2.47 (*) 0.00 - 1.49   PRO B NATRIURETIC PEPTIDE     Status: Abnormal   Collection Time   06/09/11  4:02 PM      Component Value Range   Pro B Natriuretic peptide (BNP) 888.9 (*) 0 - 450 (pg/mL)  URINALYSIS,  ROUTINE W REFLEX MICROSCOPIC     Status: Abnormal   Collection Time   06/09/11  4:37 PM      Component Value Range   Color, Urine YELLOW  YELLOW    APPearance CLOUDY (*) CLEAR    Specific Gravity, Urine 1.012  1.005 - 1.030    pH 5.0  5.0 - 8.0    Glucose, UA NEGATIVE  NEGATIVE (mg/dL)   Hgb urine dipstick NEGATIVE  NEGATIVE    Bilirubin Urine NEGATIVE  NEGATIVE    Ketones, ur NEGATIVE  NEGATIVE (mg/dL)   Protein, ur NEGATIVE  NEGATIVE (mg/dL)   Urobilinogen, UA 0.2  0.0 -  1.0 (mg/dL)   Nitrite POSITIVE (*) NEGATIVE    Leukocytes, UA MODERATE (*) NEGATIVE   URINE MICROSCOPIC-ADD ON     Status: Abnormal   Collection Time   06/09/11  4:37 PM      Component Value Range   Squamous Epithelial / LPF FEW (*) RARE    WBC, UA 7-10  <3 (WBC/hpf)   Bacteria, UA MANY (*) RARE    Casts HYALINE CASTS (*) NEGATIVE    Dg Chest 2 View  06/09/2011  *RADIOLOGY REPORT*  Clinical Data: Shortness of breath.  History of atrial fibrillation.  CHF.  Cough.  CHEST - 2 VIEW  Comparison: 01/31/2010  Findings: The heart is enlarged.  There are bilateral pleural effusions.  More focal density is seen at the lung bases, right greater than left, consistent with edema and/or infection.  IMPRESSION:  1.  Bilateral effusions and bibasilar opacities right greater than left. 2.  Cardiomegaly.  Possible edema.  Original Report Authenticated By: Patterson Hammersmith, M.D.     ASSESSMENT and PLAN: 78 year old white female with a past medical history significant for paroxysmal atrial fibrillation on chronic anticoagulation, and chronic diastolic heart failure who presents with 2 weeks of progressive shortness of breath, consistent with an acute on chronic diastolic heart care exacerbation in the setting of recurrent atrial fibrillation.  1: We will admit to [facility], Dr. Antoine Poche.  Telemetry Unit.   2: Acute on chronic diastolic heart failure exacerbation. She is currently in a well-perfused, volume expanded physiologic  state. She has received IV Lasix and emergency department. We'll continue on Lasix 40 mg IV 2 times a day. We'll monitor her weights daily, as well as her ins and outs. We'll also monitor her creatinine as it is mildly elevated. I suspect that the etiology of her diastolic heart exacerbation is related to her recurrent atrial tachyarrhythmias. Her symptoms started when she began having fast heart rates. I suspect that her as she does not tolerate this atrial tachycarrythmia that she is in.  We will check an echocardiogram.   3: Paroxysmal atrial tachycarrythmia.  She has a history of proximal atrial fibrillation and her EKG from the office demonstrated atypical atrial flutter. Today, she has an atrial rate of approximately 220 beats per minute consistent with either atrial tachycardia or atypical atrial flutter. I suspect that this is the etiology for her diastolic heart failure. We will empirically increase her Rythmol dose to 225 mg 3 times a day from her current dose of 150 mg 3 times a day. We will continue on chronic Coumadin.  Currently, her heart rate is in the 90s to 110s and I suspect that will improve as she becomes more euvolemic. I do think she whould benefit from a rhythm control strategy, and because of that I think she should undergo cardioversion.  Prior to this, we will need to get her most recent INR levels from her primary care provider in [place].   4: Mild acute renal failure with a creatinine of 1.5. We will need to monitor this closely as she needs diuresis.  5: Abnormal urinalysis with positive nitrites and leukocytes. She recently was treated for urinary tract infection, and currently, she is asymptomatic and does not have a fever or dysuria.  A this time, we will not treat this and will followup on urine culture.  6: Hypertension. Continue home medications of Toprol, Cozaar, and Norvasc.  7: COPD continue home medications.    8: FEN: SLIVF, electrolytes are stable, NPO  after  midnight.   9: DVT prophylaxis: N/A as she is on coumadin.    Bernestine Amass. Mayford Knife, MD Level 3 Admission.

## 2011-06-10 ENCOUNTER — Other Ambulatory Visit: Payer: Self-pay

## 2011-06-10 DIAGNOSIS — I369 Nonrheumatic tricuspid valve disorder, unspecified: Secondary | ICD-10-CM

## 2011-06-10 LAB — BASIC METABOLIC PANEL WITH GFR
BUN: 23 mg/dL (ref 6–23)
CO2: 30 meq/L (ref 19–32)
Calcium: 9 mg/dL (ref 8.4–10.5)
Chloride: 103 meq/L (ref 96–112)
Creatinine, Ser: 1.45 mg/dL — ABNORMAL HIGH (ref 0.50–1.10)
GFR calc Af Amer: 36 mL/min — ABNORMAL LOW
GFR calc non Af Amer: 31 mL/min — ABNORMAL LOW
Glucose, Bld: 128 mg/dL — ABNORMAL HIGH (ref 70–99)
Potassium: 3.8 meq/L (ref 3.5–5.1)
Sodium: 143 meq/L (ref 135–145)

## 2011-06-10 LAB — CARDIAC PANEL(CRET KIN+CKTOT+MB+TROPI)
CK, MB: 3.1 ng/mL (ref 0.3–4.0)
CK, MB: 2.5 ng/mL (ref 0.3–4.0)
CK, MB: 2.5 ng/mL (ref 0.3–4.0)
Relative Index: INVALID (ref 0.0–2.5)
Relative Index: INVALID (ref 0.0–2.5)
Relative Index: INVALID (ref 0.0–2.5)
Total CK: 51 U/L (ref 7–177)
Total CK: 42 U/L (ref 7–177)
Total CK: 46 U/L (ref 7–177)
Troponin I: <0.3 ng/mL
Troponin I: <0.3 ng/mL
Troponin I: <0.3 ng/mL (ref <0.30)

## 2011-06-10 MED ORDER — SODIUM CHLORIDE 0.9 % IJ SOLN: 3.0000 mL | INTRAMUSCULAR | Status: DC | PRN

## 2011-06-10 MED ORDER — HYDROCORTISONE 1 % EX CREA
1.0000 "application " | TOPICAL_CREAM | Freq: Three times a day (TID) | CUTANEOUS | Status: DC | PRN
  Filled 2011-06-10: qty 28

## 2011-06-10 MED ORDER — SODIUM CHLORIDE 0.9 % IJ SOLN
3.0000 mL | Freq: Two times a day (BID) | INTRAMUSCULAR | Status: DC
  Administered 2011-06-11 – 2011-06-13 (×5): 3 mL via INTRAVENOUS

## 2011-06-10 MED ORDER — CEFUROXIME AXETIL 250 MG PO TABS
250.0000 mg | ORAL_TABLET | Freq: Two times a day (BID) | ORAL | Status: DC
  Administered 2011-06-10 – 2011-06-14 (×9): 250 mg via ORAL
  Filled 2011-06-10 (×11): qty 1

## 2011-06-10 MED ORDER — SODIUM CHLORIDE 0.9 % IV SOLN: 250.0000 mL | INTRAVENOUS | Status: DC

## 2011-06-10 MED ORDER — FUROSEMIDE 10 MG/ML IJ SOLN
40.0000 mg | Freq: Every day | INTRAMUSCULAR | Status: DC
  Administered 2011-06-11: 40 mg via INTRAVENOUS
  Filled 2011-06-10 (×2): qty 4

## 2011-06-10 MED ORDER — SODIUM CHLORIDE 0.9 % IV SOLN: INTRAVENOUS | Status: DC

## 2011-06-10 NOTE — Plan of Care (Signed)
Problem: Food- and Nutrition-Related Knowledge Deficit (NB-1.1) Goal: Nutrition education Formal process to instruct or train a patient/client in a skill or to impart knowledge to help patients/clients voluntarily manage or modify food choices and eating behavior to maintain or improve health.  Outcome: Completed/Met Date Met:  06/10/11 RD spoke with patient about diet at home. Patient states that she has not had diet education in the past, but she knows not to use the salt shaker. RD spoke with patient about other places in her diet salt may be present. Patient states she does not use many canned goods or frozen meals. Patient verbalized understanding of high sodium foods. Chart reviewed, BMI= 21.6, diet NPO at this time previously Heart Healthy. No other interventions at this time.   Julie Rich

## 2011-06-10 NOTE — Progress Notes (Signed)
Pt wants MD to talk with her daughter before she agrees to sign consent for DCCV.  Spoke with pt's daughter, Notified MD to call pt's daughter [name].

## 2011-06-10 NOTE — Progress Notes (Signed)
Pt c/o sudden dizziness and BLE tingling after sitting up to eat dinner, A & O x4, no extremity weakness, BP 117/78 HR 102 afib on monitor, 94% on RA, Pt denies CP and SOB, placed pt back on 2l Alpha O2, pt 97% on 2l  O2, MD notified.

## 2011-06-10 NOTE — Progress Notes (Signed)
@   Subjective:  Dyspnea improving; no chest pain   Objective:  Filed Vitals:   06/10/11 0006 06/10/11 0317 06/10/11 0420 06/10/11 0458  BP:  100/59 101/62   Pulse:  101 100   Temp:  97.9 F (36.6 C) 97.6 F (36.4 C)   TempSrc:      Resp:  18 18   Height:      Weight:    112 lb 14 oz (51.2 kg)  SpO2: 98% 98% 98%     Intake/Output from previous day:  Intake/Output Summary (Last 24 hours) at 06/10/11 1003 Last data filed at 06/10/11 0900  Gross per 24 hour  Intake    240 ml  Output   1200 ml  Net   -960 ml    Physical Exam: Physical exam: Well-developed frail in no acute distress.  Skin is warm and dry.  HEENT is normal.  Neck is supple. No thyromegaly.  Chest is clear to auscultation with normal expansion.  Cardiovascular exam is irregular Abdominal exam nontender or distended. No masses palpated. Extremities show no edema. neuro grossly intact    Lab Results: Basic Metabolic Panel:  Basename 06/09/11 2315 06/09/11 1554  NA 143 142  K 3.8 4.5  CL 103 104  CO2 30 29  GLUCOSE 128* 116*  BUN 23 23  CREATININE 1.45* 1.54*  CALCIUM 9.0 9.5  MG -- --  PHOS -- --   CBC:  Basename 06/09/11 1554  WBC 5.6  NEUTROABS 3.9  HGB 13.4  HCT 40.7  MCV 92.1  PLT 233   Cardiac Enzymes:  Basename 06/09/11 2316 06/09/11 1554  CKTOTAL 51 57  CKMB 3.1 3.2  CKMBINDEX -- --  TROPONINI <0.30 <0.30     Assessment/Plan:  1) Acute on chronic diastolic CHF; volume status improving; change lasix to 40 daily; follow renal function 2) Atrial flutter vs atrial tachycardia; HR controlled; continue toprol and coumadin; patient had propafenone level drawn recently; plan had been if this was therapeutic, dc and begin amiodarone; if low, increase dose of propafenone and then DCCV. Propafenone was increased yesterday; fu on level drawn as outpatient. Check INR records from Dr [facility] office; if therapeutic for 3 consecutive weeks, proceed with DCCV in AM as this is most likely  cause of CHF. 3) UTI - allergy to septra, cipro and quinolones. Will use cefroxime 250 BID; fu culture. 4) Hypertension; continue present meds. 5) Renal insufficiency; repeat BMET in AM   [facility] 06/10/2011, 10:03 AM

## 2011-06-10 NOTE — Progress Notes (Signed)
  Echocardiogram 2D Echocardiogram has been performed.  Julie Rich 06/10/2011, 3:05 PM 

## 2011-06-10 NOTE — Progress Notes (Signed)
Courtesy visit.  Events noted and i appreciate cardiology efforts.  She has some tingling in both legs now but extremities are warm with good p.t. Pulses bilaterally and she has normal strength in her legs. I think it is related to some pinched nerves in her back given the way she has been lying in bed here. We will just follow this symptom for now with no other testing.  Agree with other efforts to help with a-flutter, chf.  Rodrigo Ran i put paper records on shadow chart from our office chart fyi.

## 2011-06-11 DIAGNOSIS — I509 Heart failure, unspecified: Secondary | ICD-10-CM

## 2011-06-11 DIAGNOSIS — I5033 Acute on chronic diastolic (congestive) heart failure: Secondary | ICD-10-CM

## 2011-06-11 DIAGNOSIS — I4891 Unspecified atrial fibrillation: Secondary | ICD-10-CM

## 2011-06-11 LAB — BASIC METABOLIC PANEL
BUN: 28 mg/dL — ABNORMAL HIGH (ref 6–23)
Chloride: 99 mEq/L (ref 96–112)
Glucose, Bld: 102 mg/dL — ABNORMAL HIGH (ref 70–99)
Potassium: 3.4 mEq/L — ABNORMAL LOW (ref 3.5–5.1)

## 2011-06-11 MED ORDER — POTASSIUM CHLORIDE CRYS ER 20 MEQ PO TBCR
40.0000 meq | EXTENDED_RELEASE_TABLET | Freq: Once | ORAL | Status: AC
  Administered 2011-06-11: 40 meq via ORAL
  Filled 2011-06-11: qty 2

## 2011-06-11 NOTE — Progress Notes (Addendum)
Patient Name: Julie Rich Date of Encounter: 06/11/2011     Principal Problem:  *Acute on chronic diastolic congestive heart failure Active Problems:  HYPERLIPIDEMIA  HYPERTENSION  Atrial fibrillation  Atrial flutter    SUBJECTIVE:  Feels fatigued but no dyspnea (at rest).  Has not really been out of bed.  Still in afib/flutter with reasonable rate control in low-100's.   CURRENT MEDS    . amLODipine  10 mg Oral Daily  . cefUROXime  250 mg Oral BID WC  . Fluticasone-Salmeterol  1 puff Inhalation BID  . furosemide  40 mg Intravenous Daily  . levothyroxine  25 mcg Oral QAC breakfast  . losartan  100 mg Oral Daily  . metoprolol succinate  100 mg Oral Daily  . propafenone  225 mg Oral Q8H  . simvastatin  20 mg Oral q1800  . sodium chloride  3 mL Intravenous Q12H  . sodium chloride  3 mL Intravenous Q12H  . tiotropium  18 mcg Inhalation Daily  . warfarin  4 mg Oral q1800  . zolpidem  5 mg Oral QHS    OBJECTIVE  Filed Vitals:   06/10/11 2028 06/10/11 2100 06/11/11 0338 06/11/11 0814  BP:  106/56 101/68   Pulse:  67 93   Temp:  97.9 F (36.6 C) 98.1 F (36.7 C)   TempSrc:  Oral    Resp:  18 18   Height:      Weight:   114 lb 10.2 oz (52 kg)   SpO2: 98% 99% 93% 92%    Intake/Output Summary (Last 24 hours) at 06/11/11 1217 Last data filed at 06/11/11 0900  Gross per 24 hour  Intake    540 ml  Output   1000 ml  Net   -460 ml    PHYSICAL EXAM  General: pleasant, no acute distress. Head: Normocephalic, atraumatic, sclera non-icteric, no xanthomas, nares are without discharge.  Neck: Supple without bruits or JVD. Lungs:  Resp regular and unlabored, scatt rhonci. Heart: ir, ir, tachy.  no s3, s4.  2/6 sm llsb. Abdomen: Soft, non-tender, non-distended, BS + x 4.  Msk:  Strength and tone appears normal for age. Extremities: No clubbing, cyanosis or edema. DP/PT/Radials 2+ and equal bilaterally. Neuro: Alert and oriented X 3. Moves all extremities  spontaneously. Psych: Normal affect.  LABS:  CBC:  Basename 06/09/11 1554  WBC 5.6  NEUTROABS 3.9  HGB 13.4  HCT 40.7  MCV 92.1  PLT 233   Basic Metabolic Panel:  Basename 06/11/11 0550 06/09/11 2315  NA 139 143  K 3.4* 3.8  CL 99 103  CO2 29 30  GLUCOSE 102* 128*  BUN 28* 23  CREATININE 1.43* 1.45*  CALCIUM 8.7 9.0  MG -- --  PHOS -- --   Liver Function Tests:  [facility] 06/09/11 1554  AST 19  ALT 30  ALKPHOS 91  BILITOT 0.3  PROT 7.0  ALBUMIN 3.6   Cardiac Enzymes:  Basename 06/10/11 1542 06/10/11 0846 06/09/11 2316  CKTOTAL 46 42 51  CKMB 2.5 2.5 3.1  CKMBINDEX -- -- --  TROPONINI <0.30 <0.30 <0.30   D-Dimer:  Basename 06/09/11 1554  DDIMER 0.51*   TELE  Afib/flutter 100's  Radiology/Studies:  Dg Chest 2 View  06/09/2011  *RADIOLOGY REPORT*  Clinical Data: Shortness of breath.  History of atrial fibrillation.  CHF.  Cough.  CHEST - 2 VIEW  Comparison: 01/31/2010  Findings: The heart is enlarged.  There are bilateral pleural effusions.  More focal  density is seen at the lung bases, right greater than left, consistent with edema and/or infection.  IMPRESSION:  1.  Bilateral effusions and bibasilar opacities right greater than left. 2.  Cardiomegaly.  Possible edema.  Original Report Authenticated By: Patterson Hammersmith, M.D.   06/10/2011 2D Echo Study Conclusions  - Left ventricle: The cavity size was normal. Wall thickness was increased in a pattern of mild LVH. Systolic function was low normal to mildly reduced. The estimated ejection fraction was in the range of 50% to 55%. Wall motion was normal; there were no regional wall motion abnormalities. - Aortic valve: There was no stenosis. Mild regurgitation. - Mitral valve: Trivial regurgitation. - Left atrium: The atrium was moderately dilated. - Right ventricle: The cavity size was mildly dilated. Systolic function was normal. - Right atrium: The atrium was moderately dilated. - Tricuspid  valve: Moderate regurgitation. Peak RV-RA gradient: 39mm Hg (S). - Pulmonary arteries: PA peak pressure: 44mm Hg (S). - Inferior vena cava: The vessel was normal in size; the respirophasic diameter changes were in the normal range (= 50%); findings are consistent with normal central venous pressure.   ASSESSMENT AND PLAN: 1.  Acute on Chronic Diast CHF:  Volume looks good.  Neck veins are flat.  No dyspnea @ rest and able to lie flat.  HR's not ideal yet - see below.  BP's stable.  So long as rates are up, will cont lasix 40 IV daily.  Creat stable.  2.  Atrial Fibrillation/Atypical Atrial Flutter:  Rates in low-100's on Propafenone (doubled on admission) and toprol xl.  Review of INR's dating back to 12/21 show that when checked, she's been therapeutic (12/21- 2.1, 1/7 - 3.2, 1/8 - 3.8, therapeutic since admission).  Pt had a propafenone level checked on 1/8 and this was sent out on 1/10 (we have discussed with [facility] labs) and it takes 6 days for this level to return (presumably tomorrow).  Report should appear in EPIC.  Plan had previously been to evaluate level and if therapeutic - to d/c propafenone as pt would have been having recurrent arrhythmias despite therapeutic dosing - and initiate amiodarone.  If however, level was subRx, double dose (done on admit), and pursue dccv @ some point.  Pt dtr/poa has concerns about pursuing dccv prior to knowing propafenone level.  We will hold off on dccv for now.  Await level tomorrow (@ best).  Cont coumadin.  3.  Acute renal insuff:  Stable.  Tolerating diuresis.  Follow.  4.  Hypokalemia:  supp.  Signed, Nicolasa Ducking NP   History reviewed with the patient, no changes to be made.  The patient exam reveals lungs clear, no edema.  All available labs, radiology testing, previous records reviewed. Agree with documented assessment and plan.  I discussed this at length with the patient's daughter today.  The plan is, if the propafenone level  comes back therapeutic we will call this a drug failure and switch to amiodarone and cardiovert at a later date if she remains in fib.  If the level comes back low we will assume that it is higher now that the dose has been increased and we will cardiovert in patient on this higher dose since the rhythm seems to be symptomatic and persistent now.   Fayrene Fearing Oyinkansola Truax  1:55 PM 04/12/2011

## 2011-06-11 NOTE — Progress Notes (Deleted)
Respiratory Therapist in to see pt to provide breathing tx.  Dr Valentina Lucks notified  On call for Dr. Earl Gala.  Later spoke with Dr. Earl Gala reqarding pt's condition as mentioned in previous note. MD gave order to RT for breathing tx.  02 sats >92% with tx.  Pt's primary nurse Toni Amend made aware of events.  Rapaid  Response Nurse at the bedside.

## 2011-06-11 NOTE — Progress Notes (Signed)
Paged Dr.Crenshaw about orders for cardioversion this am at 0656.Whether or not he wanted the orders acknowledge right now. Called at 0700 was told he was unsure but that Dr. Antoine Poche will be over the procedure and will make a decision today whether or not to it today.

## 2011-06-11 NOTE — Progress Notes (Signed)
06/11/11 1720 Nursing Note: Pt complaining of dizziness after getting to Bedside Commode. Pt's vitals stable at this time. Blood pressure of 125/61 and heart rate of 74. Pt's oxygen level in upper 90's. No other complaints noted. Pt educated to use call bell when need to get to beside commode with assistance. Call bell within reach. Will continue to monitor pt. Hermela Hardt Scientist, clinical (histocompatibility and immunogenetics).

## 2011-06-12 ENCOUNTER — Other Ambulatory Visit: Payer: Self-pay

## 2011-06-12 LAB — BASIC METABOLIC PANEL
CO2: 30 mEq/L (ref 19–32)
Chloride: 101 mEq/L (ref 96–112)
Glucose, Bld: 102 mg/dL — ABNORMAL HIGH (ref 70–99)
Potassium: 3.8 mEq/L (ref 3.5–5.1)
Sodium: 138 mEq/L (ref 135–145)

## 2011-06-12 LAB — PROTIME-INR: INR: 2.85 — ABNORMAL HIGH (ref 0.00–1.49)

## 2011-06-12 MED ORDER — TEMAZEPAM 15 MG PO CAPS
15.0000 mg | ORAL_CAPSULE | Freq: Every evening | ORAL | Status: DC | PRN
  Administered 2011-06-12 – 2011-06-13 (×2): 15 mg via ORAL
  Filled 2011-06-12 (×2): qty 1

## 2011-06-12 MED ORDER — FUROSEMIDE 40 MG PO TABS
40.0000 mg | ORAL_TABLET | Freq: Every day | ORAL | Status: DC
  Administered 2011-06-12 – 2011-06-14 (×3): 40 mg via ORAL
  Filled 2011-06-12 (×3): qty 1

## 2011-06-12 MED ORDER — POTASSIUM CHLORIDE 20 MEQ PO PACK
40.0000 meq | PACK | Freq: Once | ORAL | Status: AC
  Administered 2011-06-12: 40 meq via ORAL
  Filled 2011-06-12: qty 2

## 2011-06-12 NOTE — Progress Notes (Signed)
   CARE MANAGEMENT NOTE HEART FAILURE  06/12/2011   Patient:  Julie Rich, Julie Rich   Account Number:  [id number]    Date Initiated:  06/12/2011  Documentation initiated by:  Tera Mater  Subjective/Objective Assessment:   78yo female admitted thru ED with SOB.  Pt. lives alone, however has neighbors that assist her.  HX AFIB, CHF, HTN, STROKE   Action/Plan:   Spoke with pt. at length about HF education.  Pt. had book and knew the colors of the zones.  Pt. has a scale at home. Is not interested in having a HH HF RN at this time.   Anticipated DC Date:  06/14/2011  Anticipated DC Plan:  HOME/SELF CARE  DC Planning Services:  CM consult    Choice offered to / List presented to:          Status of service:  In process, will continue to follow  Medicare Important Message Given:   (If response is "NO", the following Medicare IM given date fields will be blank) Date Medicare IM Given:   Date Additional Medicare IM Given:    Discharge Disposition:    Per UR Regulation:  Reviewed for med. necessity/level of care/duration of stay  Comments:   06/12/11 1530  UR completed. Tera Mater, RN, BSN   Initial CM contact:  06/12/2011 12:30 PM  By:  Tera Mater Initial CSW contact:     By:      Is this an INP Readmission < 30 days:  N (If "YES" please see readmission information at the bottom of note)  Patient living status prior to this admission:  ALONE  Patient setting prior to this admission:  HOME  Comorbid conditions being treated that contributed to this admission:  CHF, HTN, AFIB, STROKE  CHF Readmission Risk:  high  Type of patient education provided  HF Patient Education Assessment / Teach Back  HF Zone Tool / Magnet  Limit salt intake  Weigh daily     Patient education provided by  [facility]    Was referral made to [facility]:  N  Is the patient's PCP the same as attending:  N PCP:  PERINI,MARK A  Readmission < 30 Days If pt has HH, did they contact  the agency before going to the ED:   Name of [facility] agency:    Was the follow-up physician visit scheduled prior to discharge:    Did the patient follow-up with the physician prior to this readmission:    Was there HF Clinic visits prior to readmission:    Were there ED visits between admissions:    Readmit type:    If unscheduled and related indicate reason for readmit:

## 2011-06-12 NOTE — Progress Notes (Signed)
   SUBJECTIVE:  Breathing OK.  No pain   PHYSICAL EXAM Filed Vitals:   06/11/11 2135 06/11/11 2201 06/12/11 0500 06/12/11 0639  BP: 103/56 112/54 104/65 106/69  Pulse: 98 98 110 111  Temp: 98 F (36.7 C) 98 F (36.7 C) 98.2 F (36.8 C)   TempSrc: Oral Oral Oral   Resp: 18 16 18    Height:      Weight:   113 lb 1.6 oz (51.302 kg)   SpO2: 94% 94% 95%    General:  No distress Lungs:  Clear Heart:  Irregular Abdomen:  Positive bowel sounds, no rebound no guarding Extremities:  No edema.  LABS: Lab Results  Component Value Date   CKTOTAL 46 06/10/2011   CKMB 2.5 06/10/2011   TROPONINI <0.30 06/10/2011   Results for orders placed during the hospital encounter of 06/09/11 (from the past 24 hour(s))  PROTIME-INR     Status: Abnormal   Collection Time   06/12/11  6:25 AM      Component Value Range   Prothrombin Time 30.4 (*) 11.6 - 15.2 (seconds)   INR 2.85 (*) 0.00 - 1.49     Intake/Output Summary (Last 24 hours) at 06/12/11 4098 Last data filed at 06/11/11 2215  Gross per 24 hour  Intake    697 ml  Output    775 ml  Net    -78 ml    Telemtery:  Atrial fib with rate of about 100  ASSESSMENT AND PLAN:   1)  Acute on chronic diastolic congestive heart failure:  Seems to be euvolemic on exam. Change to po diuretic.  2)  Atrial fibrillation:  Still this rhythm.  The plan has been discussed and outlined with the daughter and her patient.  We are waiting for a propafenone level.  if the propafenone level comes back therapeutic we will call this a drug failure and switch to amiodarone and cardiovert at a later date if she remains in fib. If the level comes back low we will assume that it is higher now that the dose has been increased and we will cardiovert in patient on this higher dose since the rhythm seems to be symptomatic and persistent now.  INRs have been therapeutic.       HYPERTENSION     Rollene Rotunda 06/12/2011 7:26 AM

## 2011-06-12 NOTE — Telephone Encounter (Signed)
Pt in the hospital

## 2011-06-13 ENCOUNTER — Telehealth: Payer: Self-pay | Admitting: Cardiology

## 2011-06-13 LAB — BASIC METABOLIC PANEL
BUN: 33 mg/dL — ABNORMAL HIGH (ref 6–23)
GFR calc Af Amer: 38 mL/min — ABNORMAL LOW (ref >90)
GFR calc non Af Amer: 33 mL/min — ABNORMAL LOW (ref >90)
Potassium: 4.1 mEq/L (ref 3.5–5.1)
Sodium: 138 mEq/L (ref 135–145)

## 2011-06-13 LAB — PROTIME-INR
INR: 3.49 — ABNORMAL HIGH (ref 0.00–1.49)
Prothrombin Time: 35.6 seconds — ABNORMAL HIGH (ref 11.6–15.2)

## 2011-06-13 MED ORDER — POLYETHYLENE GLYCOL 3350 17 G PO PACK
17.0000 g | PACK | Freq: Once | ORAL | Status: AC
  Administered 2011-06-13: 17 g via ORAL
  Filled 2011-06-13: qty 1

## 2011-06-13 NOTE — Telephone Encounter (Signed)
Pt's dtr said pt to be dc from hospital today, still dizzy, doesn't want her to be dc today, worried about the snow and doesn't have a nurse lined up at home

## 2011-06-13 NOTE — Progress Notes (Signed)
1500 no result of rhthmol arrived . Pt feeling hungry. Referred pt to md . Annabelle Harman , pa called back with orders . Pt informed and fed

## 2011-06-13 NOTE — Telephone Encounter (Signed)
Daughter concerned that because of the bad weather forecast pt will not be able to get home tonight and that she has not been able to set up a nurse to stay with the patient.  Per Dr Antoine Poche OK for pt to stay in the hospital however the charges my not be covered.  Daughter also concerned because we have not  received the pts lab results that were drawn last week at [facility].  I will attempt to call them to get the results.

## 2011-06-13 NOTE — Progress Notes (Signed)
SUBJECTIVE:  Breathing OK.  No pain.   PHYSICAL EXAM Filed Vitals:   06/12/11 1400 06/12/11 2055 06/12/11 2057 06/13/11 0707  BP: 110/72 94/57  99/54  Pulse: 105 106  93  Temp: 97.6 F (36.4 C) 98.3 F (36.8 C)  97.6 F (36.4 C)  TempSrc: Oral Oral  Oral  Resp: 18 16  16   Height:      Weight:    51.438 kg (113 lb 6.4 oz)  SpO2: 94% 93% 93% 95%   General:  No distress Lungs:  Clear Heart:  Irregular Abdomen:  Positive bowel sounds, no rebound no guarding Extremities:  No edema.  LABS: Lab Results  Component Value Date   CKTOTAL 46 06/10/2011   CKMB 2.5 06/10/2011   TROPONINI <0.30 06/10/2011   Results for orders placed during the hospital encounter of 06/09/11 (from the past 24 hour(s))  PROTIME-INR     Status: Abnormal   Collection Time   06/13/11  5:43 AM      Component Value Range   Prothrombin Time 35.6 (*) 11.6 - 15.2 (seconds)   INR 3.49 (*) 0.00 - 1.49   BASIC METABOLIC PANEL     Status: Abnormal   Collection Time   06/13/11  5:43 AM      Component Value Range   Sodium 138  135 - 145 (mEq/L)   Potassium 4.1  3.5 - 5.1 (mEq/L)   Chloride 104  96 - 112 (mEq/L)   CO2 26  19 - 32 (mEq/L)   Glucose, Bld 93  70 - 99 (mg/dL)   BUN 33 (*) 6 - 23 (mg/dL)   Creatinine, Ser 1.61 (*) 0.50 - 1.10 (mg/dL)   Calcium 8.9  8.4 - 09.6 (mg/dL)   GFR calc non Af Amer 33 (*) >90 (mL/min)   GFR calc Af Amer 38 (*) >90 (mL/min)    Intake/Output Summary (Last 24 hours) at 06/13/11 0732 Last data filed at 06/13/11 0600  Gross per 24 hour  Intake    520 ml  Output    600 ml  Net    -80 ml    Telemtery:  Atrial fib with rate of about 100 06/13/2011  ASSESSMENT AND PLAN:   1)  Acute on chronic diastolic congestive heart failure:  Seems to be euvolemic on exam. Change to po diuretic.  2)  Atrial fibrillation:  Still  In this rhythm with rate reasonably controlled.  The plan has been discussed and outlined with the daughter and her patient.  We are waiting for a propafenone  level.  if the propafenone level comes back therapeutic we will call this a drug failure and switch to amiodarone and cardiovert at a later date if she remains in fib. If the level comes back low we will assume that it is higher now that the dose has been increased and we will cardiovert in patient on this higher dose since the rhythm seems to be symptomatic and persistent now.  INRs have been therapeutic.  I searched the chart again and the level is not back.  We have called the primary care office and we are told it was sent.  I spoke with the patient and with her daughter last night.  If the level is not back we will send her out.  Can she qualify for PT at home?  I will check another propafenone level.    3)  UTI:  Day 4 of 5 of Cefuroxime.    4)  Disposition:  As above, we will try to get PT at home.  She will have to have her INR  followed at [facility].     Rollene Rotunda 06/13/2011 7:32 AM

## 2011-06-13 NOTE — Telephone Encounter (Signed)
Called [facility] - they do not have the results back as of yet.  The blood was sent 06/04/11 and is to "take 8 days not including weekends".  They will call results when they get them.

## 2011-06-13 NOTE — Telephone Encounter (Signed)
Dr Antoine Poche is aware and will address.

## 2011-06-13 NOTE — Progress Notes (Addendum)
ANTICOAGULATION CONSULT NOTE - Initial Consult  Pharmacy Consult for Coumadin Indication: atrial fibrillation  Allergies  Allergen Reactions  . Ciprofloxacin     unknown  . Quinolones     unknown  . Sulfonamide Derivatives     unknown    Patient Measurements: Height: 5\' 1"  (154.9 cm) Weight: 113 lb 6.4 oz (51.438 kg) (scale C) IBW/kg (Calculated) : 47.8  Adjusted Body Weight:   Vital Signs: Temp: 97.6 F (36.4 C) (01/17 0707) Temp src: Oral (01/17 0707) BP: 102/64 mmHg (01/17 0916) Pulse Rate: 92  (01/17 0916)  Labs:  Basename 06/13/11 0543 06/12/11 0625 06/11/11 0550 06/10/11 1542  HGB -- -- -- --  HCT -- -- -- --  PLT -- -- -- --  APTT -- -- -- --  LABPROT 35.6* 30.4* 29.5* --  INR 3.49* 2.85* 2.75* --  HEPARINUNFRC -- -- -- --  CREATININE 1.39* 1.46* 1.43* --  CKTOTAL -- -- -- 46  CKMB -- -- -- 2.5  TROPONINI -- -- -- <0.30   Estimated Creatinine Clearance: 21.5 ml/min (by C-G formula based on Cr of 1.39).  Medical History: Past Medical History  Diagnosis Date  . HTN (hypertension)   . A-fib   . Dyslipidemia   . Hypothyroidism   . Stroke   . Asthma   . Shortness of breath   . DEMENTIA   . CHF (congestive heart failure)   . GERD (gastroesophageal reflux disease)   . Anxiety   . COPD (chronic obstructive pulmonary disease)     Medications:  Prescriptions prior to admission  Medication Sig Dispense Refill  . albuterol (PROVENTIL,VENTOLIN) 90 MCG/ACT inhaler Inhale 2 puffs into the lungs every 6 (six) hours as needed. For shortness of breath      . ALPRAZolam (XANAX) 0.25 MG tablet Take 0.25 mg by mouth as needed. For anxiety      . amLODipine (NORVASC) 10 MG tablet Take 10 mg by mouth daily.        Marland Kitchen atorvastatin (LIPITOR) 10 MG tablet 20 mg. Pt takes a half of a 20 mg tab every other day      . Fluticasone-Salmeterol (ADVAIR DISKUS) 250-50 MCG/DOSE AEPB Inhale 1 puff into the lungs 2 (two) times daily.       Marland Kitchen levothyroxine (SYNTHROID,  LEVOTHROID) 25 MCG tablet Take 25 mcg by mouth daily.        Marland Kitchen losartan (COZAAR) 100 MG tablet Take 100 mg by mouth daily.        . metoprolol (TOPROL-XL) 100 MG 24 hr tablet Take 100 mg by mouth daily.        . propafenone (RYTHMOL) 150 MG tablet Take 150 mg by mouth 3 (three) times daily.        . temazepam (RESTORIL) 15 MG capsule Take 15 mg by mouth at bedtime.        Marland Kitchen tiotropium (SPIRIVA) 18 MCG inhalation capsule Place 18 mcg into inhaler and inhale daily.        Marland Kitchen warfarin (COUMADIN) 4 MG tablet Take 4 mg by mouth daily.        Assessment: 89yof on chronic Coumadin for Afib. INR (3.49) is now supratherapeutic after increasing significantly on 4mg  daily. Patient states she takes 4mg  daily except 2mg  (1/2 tab) M,W,F. INR could have also been affected by increased propafenone dose during hospitalization. Propafenone can inhibit Coumadin metabolism leading to increased Coumadin effects. MD has requested pharmacy to assume Coumadin management. Home regimen has been corrected on Med  Red Form.  Goal of Therapy:  INR 2-3   Plan:  1. No Coumadin tonight 2. Daily PT/INR  Cleon Dew [phone] 06/13/2011,1:58 PM

## 2011-06-14 DIAGNOSIS — I5033 Acute on chronic diastolic (congestive) heart failure: Principal | ICD-10-CM

## 2011-06-14 DIAGNOSIS — N390 Urinary tract infection, site not specified: Secondary | ICD-10-CM

## 2011-06-14 MED ORDER — FUROSEMIDE 40 MG PO TABS: 40.0000 mg | ORAL_TABLET | Freq: Every day | ORAL | Status: DC

## 2011-06-14 MED ORDER — PROPAFENONE HCL 225 MG PO TABS: 225.0000 mg | ORAL_TABLET | Freq: Three times a day (TID) | ORAL | Status: DC

## 2011-06-14 NOTE — Progress Notes (Signed)
   CARE MANAGEMENT NOTE HEART FAILURE  06/14/2011   Patient:  Julie Rich, Julie Rich   Account Number:  [id number]    Date Initiated:  06/12/2011  Documentation initiated by:  Tera Mater  Subjective/Objective Assessment:   78yo female admitted thru ED with SOB.  Pt. lives alone, however has neighbors that assist her.  HX AFIB, CHF, HTN, STROKE   Action/Plan:   Spoke with pt. at length about HF education.  Pt. had book and knew the colors of the zones.  Pt. has a scale at home. Is not interested in having a HH HF RN at this time.   Anticipated DC Date:  06/14/2011  Anticipated DC Plan:  HOME/SELF CARE  DC Planning Services:  CM consult    PAC Choice:  HOME HEALTH   Choice offered to / List presented to:  C-4 Adult Children    HH arranged:  HH-1 RN  HH-10 DISEASE MANAGEMENT  HH-2 PT  HH-4 NURSE'S AIDE     HH agency:  [facility]    Status of service:  Completed, signed off  Medicare Important Message Given:   (If response is "NO", the following Medicare IM given date fields will be blank) Date Medicare IM Given:   Date Additional Medicare IM Given:    Discharge Disposition:  HOME/SELF CARE  Per UR Regulation:  Reviewed for med. necessity/level of care/duration of stay  Comments:   06/14/11 1200  Spoke with pt. of new HH orders for [facility] PT and pt. did agree to these services.  Also, pt. gave this NCM permission to speak with her daughter, Julie Rich [phone]).  TC to [place] in reference to [facility] services for her mother and she agreed for [facility] RN, HH aide, HH PT. After giving [name] choices, she chose [facility]. TC to Hilda Lias, with [facility] to give referral for above services.  Order placed in [facility]. Tera Mater, RN, BSN   06/12/11 1530  UR completed. Tera Mater, RN, BSN   Initial CM contact:  06/12/2011 12:30 PM  By:  Tera Mater Initial CSW contact:     By:      Is this an INP Readmission < 30 days:  N (If "YES" please see  readmission information at the bottom of note)  Patient living status prior to this admission:  ALONE  Patient setting prior to this admission:  HOME  Comorbid conditions being treated that contributed to this admission:  CHF, HTN, AFIB, STROKE  CHF Readmission Risk:  high  Type of patient education provided  HF Patient Education Assessment / Teach Back  HF Zone Tool / Magnet  Limit salt intake  Weigh daily     Patient education provided by  [facility]    Was referral made to [facility]:  N  Is the patient's PCP the same as attending:  N PCP:  PERINI,MARK A  Readmission < 30 Days If pt has HH, did they contact the agency before going to the ED:   Name of [facility] agency:    Was the follow-up physician visit scheduled prior to discharge:    Did the patient follow-up with the physician prior to this readmission:    Was there HF Clinic visits prior to readmission:    Were there ED visits between admissions:    Readmit type:    If unscheduled and related indicate reason for readmit:

## 2011-06-14 NOTE — Progress Notes (Signed)
ANTICOAGULATION CONSULT NOTE - Initial Consult  Pharmacy Consult for Coumadin Indication: atrial fibrillation  Allergies  Allergen Reactions  . Ciprofloxacin     unknown  . Quinolones     unknown  . Sulfonamide Derivatives     unknown    Patient Measurements: Height: 5\' 1"  (154.9 cm) Weight: 112 lb 7 oz (51 kg) (scaLE C) IBW/kg (Calculated) : 47.8  Adjusted Body Weight:   Vital Signs: Temp: 97.7 F (36.5 C) (01/18 0500) Temp src: Oral (01/18 0500) BP: 103/66 mmHg (01/18 1027) Pulse Rate: 98  (01/18 1027)  Labs:  Basename 06/14/11 0430 06/13/11 0543 06/12/11 0625  HGB -- -- --  HCT -- -- --  PLT -- -- --  APTT -- -- --  LABPROT 38.6* 35.6* 30.4*  INR 3.87* 3.49* 2.85*  HEPARINUNFRC -- -- --  CREATININE -- 1.39* 1.46*  CKTOTAL -- -- --  CKMB -- -- --  TROPONINI -- -- --   Estimated Creatinine Clearance: 21.5 ml/min (by C-G formula based on Cr of 1.39).  Medical History: Past Medical History  Diagnosis Date  . HTN (hypertension)   . A-fib   . Dyslipidemia   . Hypothyroidism   . Stroke   . Asthma   . Shortness of breath   . DEMENTIA   . CHF (congestive heart failure)   . GERD (gastroesophageal reflux disease)   . Anxiety   . COPD (chronic obstructive pulmonary disease)     Medications:  Prescriptions prior to admission  Medication Sig Dispense Refill  . albuterol (PROVENTIL,VENTOLIN) 90 MCG/ACT inhaler Inhale 2 puffs into the lungs every 6 (six) hours as needed. For shortness of breath      . ALPRAZolam (XANAX) 0.25 MG tablet Take 0.25 mg by mouth as needed. For anxiety      . amLODipine (NORVASC) 10 MG tablet Take 10 mg by mouth daily.        Marland Kitchen atorvastatin (LIPITOR) 10 MG tablet 20 mg. Pt takes a half of a 20 mg tab every other day      . Fluticasone-Salmeterol (ADVAIR DISKUS) 250-50 MCG/DOSE AEPB Inhale 1 puff into the lungs 2 (two) times daily.       Marland Kitchen levothyroxine (SYNTHROID, LEVOTHROID) 25 MCG tablet Take 25 mcg by mouth daily.        Marland Kitchen  losartan (COZAAR) 100 MG tablet Take 100 mg by mouth daily.        . metoprolol (TOPROL-XL) 100 MG 24 hr tablet Take 100 mg by mouth daily.        . temazepam (RESTORIL) 15 MG capsule Take 15 mg by mouth at bedtime.        Marland Kitchen tiotropium (SPIRIVA) 18 MCG inhalation capsule Place 18 mcg into inhaler and inhale daily.        Marland Kitchen warfarin (COUMADIN) 4 MG tablet Take 4 mg by mouth 4 (four) times a week. Takes 1 tablet Tue/Thur/Sat/Sun. Takes 0.5tab Mon/Wed/Fri      . warfarin (COUMADIN) 4 MG tablet Take 2 mg by mouth 3 (three) times a week. Takes 0.5 tab (2mg ) Mon/Wed/Fri. Takes 4mg  all other days      . DISCONTD: propafenone (RYTHMOL) 150 MG tablet Take 150 mg by mouth 3 (three) times daily.          Assessment: 89yof on chronic Coumadin for Afib. INR (3.87) is now supratherapeutic after increasing significantly on 4mg  daily. Could have been affected by increased propafenone dose during hospitalization. Propafenone can inhibit Coumadin metabolism leading to increased  Coumadin effects. Goal of Therapy:  INR 2-3   Plan:  1. No Coumadin tonight 2. Daily PT/INR  Marylouise Stacks [phone] 06/14/2011,1:14 PM

## 2011-06-14 NOTE — Progress Notes (Signed)
   SUBJECTIVE:  She feels better than she has in quite some while.  No chest pain.  No SOB   PHYSICAL EXAM Filed Vitals:   06/13/11 1436 06/13/11 2124 06/13/11 2200 06/14/11 0500  BP: 112/76  100/70 107/62  Pulse: 101  100 99  Temp: 97.3 F (36.3 C)  97.4 F (36.3 C) 97.7 F (36.5 C)  TempSrc:   Oral Oral  Resp: 16  16 16   Height:      Weight:    112 lb 7 oz (51 kg)  SpO2: 96% 91% 91% 94%   General:  No distress Lungs:  Clear Heart:  Irregular Abdomen:  Positive bowel sounds, no rebound no guarding Extremities:  No edema.  LABS:  Results for orders placed during the hospital encounter of 06/09/11 (from the past 24 hour(s))  PROTIME-INR     Status: Abnormal   Collection Time   06/14/11  4:30 AM      Component Value Range   Prothrombin Time 38.6 (*) 11.6 - 15.2 (seconds)   INR 3.87 (*) 0.00 - 1.49     Intake/Output Summary (Last 24 hours) at 06/14/11 0743 Last data filed at 06/13/11 2200  Gross per 24 hour  Intake    486 ml  Output    425 ml  Net     61 ml    Telemetry:  Slightly irregular.  EKG with irregular rhythm and no clear P waves.  ASSESSMENT AND PLAN:  1) Acute on chronic diastolic congestive heart failure: Seems to be euvolemic on exam. Change to po diuretic.   2) Atrial fibrillation:  Her rhythm is more regular on telemetry.  EKG still looks like atrial fib.  I will get a 12 lead rhythm strip to further assess. The plan has been discussed and outlined with the daughter and her patient. We are waiting for a propafenone level.  I sent another level yesterday but I have no idea when this will arrive.  We called the primary care office again yesterday and still no results though they were sent.  If the propafenone level comes back therapeutic we will call this a drug failure and switch to amiodarone and cardiovert at a later date if she remains in fib. If the level comes back low we will assume that it is higher now that the dose has been increased and we will  cardiovert in patient on this higher dose since the rhythm seems to be symptomatic and persistent now. INRs have been therapeutic.  3) UTI: Day 5 of 5 of Cefuroxime.   4) Disposition: As above, we will try to get PT at home. She will have to have her INR followed at [facility]. I would like to send the patient home to await the level and bring her back for cardioversion.  Her daughter has been concerned about weakness and not yet having help in place.  She is frustrated that we don't have the level back yet and she would like to do the cardioversion here if we are going to do it but not before we have the level back.  Yesterday she was concerned about transportation because of the snow.  We should try to send her home again today.   Fayrene Fearing [facility] 06/14/2011 7:43 AM

## 2011-06-14 NOTE — Discharge Summary (Signed)
Patient ID: Julie Rich,  MRN: [id number], DOB/AGE: [birth year] 76 y.o.  Admit date: 06/09/2011 Discharge date: 06/14/2011  Primary Care Provider: M. Perini Primary Cardiologist: J. Hochrein *INR followed @ [facility]  Discharge Diagnoses Principal Problem:  *Acute on chronic diastolic congestive heart failure Active Problems:  HYPERLIPIDEMIA  HYPERTENSION  Atrial fibrillation  Atrial flutter  UTI (lower urinary tract infection)   Allergies Allergies  Allergen Reactions  . Ciprofloxacin     unknown  . Quinolones     unknown  . Sulfonamide Derivatives     unknown    Procedures  06/09/2011 CXR  Findings: The heart is enlarged. There are bilateral pleural effusions. More focal density is seen at the lung bases, right greater than left, consistent with edema and/or infection. _____________________________________________________  06/10/2011 2D Echo Study Conclusions  - Left ventricle: The cavity size was normal. Wall thickness was increased in a pattern of mild LVH. Systolic function was low normal to mildly reduced. The estimated ejection fraction was in the range of 50% to 55%. Wall motion was normal; there were no regional wall motion abnormalities. - Aortic valve: There was no stenosis. Mild regurgitation. - Mitral valve: Trivial regurgitation. - Left atrium: The atrium was moderately dilated. - Right ventricle: The cavity size was mildly dilated. Systolic function was normal. - Right atrium: The atrium was moderately dilated. - Tricuspid valve: Moderate regurgitation. Peak RV-RA gradient: 39mm Hg (S). - Pulmonary arteries: PA peak pressure: 44mm Hg (S). - Inferior vena cava: The vessel was normal in size; the respirophasic diameter changes were in the normal range (= 50%); findings are consistent with normal central venous pressure. _________________________________________________________  History of Present Illness  78 y/o female with the above problem list  who has been seen in the office recently 2/2 progressive dypnea in the setting of palpitations.  She was found to be in atypical atrial flutter on 1/7 and @ that time a propafenone level was performed (1/8).  It was felt that if her propafenone level was subtherapeutic then her dose should be increased however if her level was therapeutic, it should be considered a treatment failure and propafenone should be discontinued and amiodarone initiated.  Pt was placed on PO lasix however she continued to have respiratory decline and presented to the [facility] ED on 1/13.  CXR suggested CHF and ecg again showed atypical a. Flutter with rates in the 90-110 range.  Pt was admitted for further evaluation.  Hospital Course   Following admission, pt was placed on IV diuretics with good response and slow, symptomatic improvement.  Though her propafenone level was not yet back, her propafenone dose was increased to 225mg  tid.  Despite this change, the pt remained in a flutter/fib, though her rate improved as her resp status improved.    Initially it was felt that the pt would benefit from DCCV while hospitalized however after further discussion with the patient and her dtr, it was decided to await propafenone level results prior to pursuing DCCV.  Unfortunately, to date, the level drawn on 1/8 has not yet returned and we have contacted [facility] as well as [employer] and have been told that it takes 8 business days.  In the interim, the pt has clinically improved and is ready for discharge.  At this time, we plan discharge the patient to home today.  She has f/u with Dr. Antoine Poche next week.  We will review her propafenone level at that time and decide the next best course of action, whether  that be DCCV (INR's have been therapeutic dating back to 12/21) on increased dose propafenone, or discontinuation of propafenone in favor of amiodarone with plan for DCCV in the future, if it remains necessary.   Of note, pt was found to have  an abnormal UA on admission with culture showing E. Coli.  She has completed a course of cefuroxime.  Discharge Vitals:  Blood pressure 103/66, pulse 98, temperature 97.7 F (36.5 C), temperature source Oral, resp. rate 18, height 5\' 1"  (1.549 m), weight 112 lb 7 oz (51 kg), SpO2 94.00%.   Weight change this admission: - 2 lbs  Labs: CBC: Lab Results  Component Value Date   WBC 5.6 06/09/2011   HGB 13.4 06/09/2011   HCT 40.7 06/09/2011   MCV 92.1 06/09/2011   PLT 233 06/09/2011   Basic Metabolic Panel:  Basename 06/13/11 0543 06/12/11 0625  NA 138 138  K 4.1 3.8  CL 104 101  CO2 26 30  GLUCOSE 93 102*  BUN 33* 30*  CREATININE 1.39* 1.46*  CALCIUM 8.9 9.2  MG -- --  PHOS -- --   Lab Results  Component Value Date   ALT 30 06/09/2011   AST 19 06/09/2011   ALKPHOS 91 06/09/2011   BILITOT 0.3 06/09/2011   Lab Results  Component Value Date   CKTOTAL 46 06/10/2011   CKMB 2.5 06/10/2011   TROPONINI <0.30 06/10/2011   Lab Results  Component Value Date   DDIMER 0.51* 06/09/2011   Lab Results  Component Value Date   CKTOTAL 46 06/10/2011   CKMB 2.5 06/10/2011   TROPONINI <0.30 06/10/2011    Disposition:  Follow-up Information    Follow up with Rodrigo Ran A, MD. (as scheduled)       Follow up with [facility]. (1 week for INR follow-up (Coumadin Clinic))       Follow up with Rollene Rotunda, MD on 06/19/2011. (12:15)    Contact information:    [facility] [place] [phone]          Discharge Medications: Current Discharge Medication List    START taking these medications   Details  furosemide (LASIX) 40 MG tablet Take 1 tablet (40 mg total) by mouth daily. Qty: 30 tablet, Refills: 6      CONTINUE these medications which have CHANGED   Details  propafenone (RYTHMOL) 225 MG tablet Take 1 tablet (225 mg total) by mouth 3 (three) times daily. Qty: 90 tablet, Refills: 6      CONTINUE these medications which have NOT CHANGED    Details  albuterol (PROVENTIL,VENTOLIN) 90 MCG/ACT inhaler Inhale 2 puffs into the lungs every 6 (six) hours as needed. For shortness of breath    ALPRAZolam (XANAX) 0.25 MG tablet Take 0.25 mg by mouth as needed. For anxiety    amLODipine (NORVASC) 10 MG tablet Take 10 mg by mouth daily.      atorvastatin (LIPITOR) 10 MG tablet 20 mg. Pt takes a half of a 20 mg tab every other day    Fluticasone-Salmeterol (ADVAIR DISKUS) 250-50 MCG/DOSE AEPB Inhale 1 puff into the lungs 2 (two) times daily.     levothyroxine (SYNTHROID, LEVOTHROID) 25 MCG tablet Take 25 mcg by mouth daily.      losartan (COZAAR) 100 MG tablet Take 100 mg by mouth daily.      metoprolol (TOPROL-XL) 100 MG 24 hr tablet Take 100 mg by mouth daily.      temazepam (RESTORIL) 15 MG capsule Take 15 mg by  mouth at bedtime.      tiotropium (SPIRIVA) 18 MCG inhalation capsule Place 18 mcg into inhaler and inhale daily.      !! warfarin (COUMADIN) 4 MG tablet Take 4 mg by mouth 4 (four) times a week. Takes 1 tablet Tue/Thur/Sat/Sun. Takes 0.5tab Mon/Wed/Fri    !! warfarin (COUMADIN) 4 MG tablet Take 2 mg by mouth 3 (three) times a week. Takes 0.5 tab (2mg ) Mon/Wed/Fri. Takes 4mg  all other days     !! - Potential duplicate medications found. Please discuss with provider.      Outstanding Labs/Studies  INR in 1 week.  Propafenone level is pending.  Duration of Discharge Encounter: Greater than 30 minutes including physician time.  Signed, Nicolasa Ducking NP 06/14/2011, 12:21 PM

## 2011-06-15 ENCOUNTER — Telehealth: Payer: Self-pay | Admitting: Physician Assistant

## 2011-06-15 MED ORDER — POTASSIUM CHLORIDE CRYS ER 10 MEQ PO TBCR: 10.0000 meq | EXTENDED_RELEASE_TABLET | Freq: Every day | ORAL | Status: DC

## 2011-06-15 NOTE — Telephone Encounter (Signed)
Her mother was discharged on lasix but no potassium although she was getting potassium while in the hospital.  Have ordered Kdur 10 mEq daily and she will follow up with Dr. Antoine Poche in 4 days for labs and OV.  She appreciated the call back.

## 2011-06-15 NOTE — Discharge Summary (Signed)
Patient seen and examined.  Plan as discussed in my rounding note for today and outlined above. Fayrene Fearing [facility]  06/15/2011  7:34 AM

## 2011-06-17 ENCOUNTER — Encounter: Payer: Self-pay | Admitting: Cardiology

## 2011-06-19 ENCOUNTER — Encounter: Payer: Self-pay | Admitting: Cardiology

## 2011-06-19 ENCOUNTER — Ambulatory Visit: Payer: Medicare Other | Admitting: Cardiology

## 2011-06-19 ENCOUNTER — Ambulatory Visit (INDEPENDENT_AMBULATORY_CARE_PROVIDER_SITE_OTHER): Payer: Medicare Other | Admitting: Cardiology

## 2011-06-19 DIAGNOSIS — I4891 Unspecified atrial fibrillation: Secondary | ICD-10-CM

## 2011-06-19 DIAGNOSIS — I10 Essential (primary) hypertension: Secondary | ICD-10-CM

## 2011-06-19 DIAGNOSIS — I5033 Acute on chronic diastolic (congestive) heart failure: Secondary | ICD-10-CM

## 2011-06-19 MED ORDER — FUROSEMIDE 40 MG PO TABS: 20.0000 mg | ORAL_TABLET | Freq: Every day | ORAL | Status: DC

## 2011-06-19 MED ORDER — POTASSIUM CHLORIDE CRYS ER 10 MEQ PO TBCR: 5.0000 meq | EXTENDED_RELEASE_TABLET | Freq: Every day | ORAL | Status: DC

## 2011-06-19 MED ORDER — AMLODIPINE BESYLATE 10 MG PO TABS: 5.0000 mg | ORAL_TABLET | Freq: Every day | ORAL | Status: DC

## 2011-06-19 NOTE — Assessment & Plan Note (Signed)
The patient her daughter and I have had many hours of discussion concerning this fibrillation. We finally were able to obtain a pro-And unlevel that was 0.9 prior to the med adjustment. At this point I would suggest we proceed with cardioversion on a higher dose of propafenone.  I will check a repeat level to make sure that it is not supratherapeutic. I will check INRs to make sure that they have all been therapeutic. She will for now continue on the medications as listed with the exception as described below.

## 2011-06-19 NOTE — Progress Notes (Signed)
 HPI The patient presents for followup of atrial fibrillation. She was recently hospitalized with atrial fibrillation/flutter and diastolic dysfunction. Prior to that she had been seen in the office and her Propac unknown dose increased. We have been awaiting a propafenone level to decide on further treatment.  She has remained in atrial fibrillation though it has been somewhat slow and at times have represented an atypical flutter. Since going home she remains quite weak. She's had some low blood pressures. She has had some balance issues. She's having some shortness of breath but not as acute as she was on the day of admission. There is no PND or orthopnea. She's not had any syncope. She's not describing chest pressure, neck or arm discomfort. She's not having any weight gain or edema.  Allergies  Allergen Reactions  . Ciprofloxacin     unknown  . Quinolones     unknown  . Sulfonamide Derivatives     unknown    Current Outpatient Prescriptions  Medication Sig Dispense Refill  . albuterol (PROVENTIL,VENTOLIN) 90 MCG/ACT inhaler Inhale 2 puffs into the lungs every 6 (six) hours as needed. For shortness of breath      . ALPRAZolam (XANAX) 0.25 MG tablet Take 0.25 mg by mouth as needed. For anxiety      . amLODipine (NORVASC) 10 MG tablet Take 10 mg by mouth daily.        . atorvastatin (LIPITOR) 10 MG tablet 20 mg. Pt takes a half of a 20 mg tab every other day      . Fluticasone-Salmeterol (ADVAIR DISKUS) 250-50 MCG/DOSE AEPB Inhale 1 puff into the lungs 2 (two) times daily.       . furosemide (LASIX) 40 MG tablet Take 1 tablet (40 mg total) by mouth daily.  30 tablet  6  . levothyroxine (SYNTHROID, LEVOTHROID) 25 MCG tablet Take 25 mcg by mouth daily.        . losartan (COZAAR) 100 MG tablet Take 100 mg by mouth daily.        . metoprolol (TOPROL-XL) 100 MG 24 hr tablet Take 100 mg by mouth daily.        . potassium chloride (K-DUR,KLOR-CON) 10 MEQ tablet Take 1 tablet (10 mEq total) by  mouth daily.  30 tablet  6  . propafenone (RYTHMOL) 225 MG tablet Take 1 tablet (225 mg total) by mouth 3 (three) times daily.  90 tablet  6  . temazepam (RESTORIL) 15 MG capsule Take 15 mg by mouth at bedtime.        . tiotropium (SPIRIVA) 18 MCG inhalation capsule Place 18 mcg into inhaler and inhale daily.        . warfarin (COUMADIN) 4 MG tablet Take 4 mg by mouth 4 (four) times a week. As directed        Past Medical History  Diagnosis Date  . HTN (hypertension)   . A-fib   . Dyslipidemia   . Hypothyroidism   . Stroke   . Asthma   . Shortness of breath   . DEMENTIA   . CHF (congestive heart failure)   . GERD (gastroesophageal reflux disease)   . Anxiety   . COPD (chronic obstructive pulmonary disease)     Past Surgical History  Procedure Date  . Total abdominal hysterectomy   . Breast surgery     45-50 YEARS AGO LUMP REMOVED  . Appendectomy     ROS:  As stated in the HPI and negative for all other systems.    PHYSICAL EXAM BP 98/64  Pulse 76  Resp 16  Ht 5' (1.524 m)  Wt 114 lb (51.71 kg)  BMI 22.26 kg/m2 GENERAL:  Well appearing HEENT:  Pupils equal round and reactive, fundi not visualized, oral mucosa unremarkable NECK:  No jugular venous distention, waveform within normal limits, carotid upstroke brisk and symmetric, no bruits, no thyromegaly LYMPHATICS:  No cervical, inguinal adenopathy LUNGS:  Clear to auscultation bilaterally BACK:  No CVA tenderness CHEST:  Unremarkable HEART:  PMI not displaced or sustained,S1 and S2 within normal limits, no S3, no S4, no clicks, no rubs, no murmurs ABD:  Flat, positive bowel sounds normal in frequency in pitch, no bruits, no rebound, no guarding, no midline pulsatile mass, no hepatomegaly, no splenomegaly EXT:  2 plus pulses throughout, no edema, no cyanosis no clubbing SKIN:  No rashes no nodules NEURO:  Cranial nerves II through XII grossly intact, motor grossly intact throughout PSYCH:  Cognitively intact, oriented  to person place and time  EKG:  Atrial fibrillation vs. Atypical flutter with controlled ventricular rate 81. 06/19/2011  ASSESSMENT AND PLAN  

## 2011-06-19 NOTE — Assessment & Plan Note (Signed)
Her blood pressure was actually running low. I will cut back her Norvasc and her Lasix to half of their previous dose.

## 2011-06-19 NOTE — Patient Instructions (Addendum)
Please decrease Norvasc, Lasix and Potassium Chloride by half your normal dose  Continue all other medications as listed  You are being scheduled for an out patient cardioversion.  You will be given instruction once it is scheduled

## 2011-06-19 NOTE — Assessment & Plan Note (Signed)
I will cut back on the diuretic as described.

## 2011-06-20 ENCOUNTER — Encounter: Payer: Self-pay | Admitting: *Deleted

## 2011-06-20 ENCOUNTER — Encounter (HOSPITAL_COMMUNITY): Payer: Self-pay | Admitting: Pharmacy Technician

## 2011-06-26 ENCOUNTER — Ambulatory Visit (HOSPITAL_COMMUNITY)
Admission: RE | Admit: 2011-06-26 | Discharge: 2011-06-26 | Disposition: A | Discharge status: Home / Self Care | Payer: Medicare Other | Source: Ambulatory Visit | Attending: Cardiology | Admitting: Cardiology

## 2011-06-26 ENCOUNTER — Other Ambulatory Visit: Payer: Self-pay | Admitting: Cardiology

## 2011-06-26 ENCOUNTER — Encounter (HOSPITAL_COMMUNITY)
Admission: RE | Disposition: A | Discharge status: Home / Self Care | Payer: Self-pay | Source: Ambulatory Visit | Attending: Cardiology

## 2011-06-26 ENCOUNTER — Other Ambulatory Visit: Payer: Self-pay

## 2011-06-26 ENCOUNTER — Ambulatory Visit (HOSPITAL_COMMUNITY): Payer: Medicare Other | Admitting: Anesthesiology

## 2011-06-26 ENCOUNTER — Encounter (HOSPITAL_COMMUNITY): Payer: Self-pay | Admitting: Anesthesiology

## 2011-06-26 DIAGNOSIS — I4891 Unspecified atrial fibrillation: Secondary | ICD-10-CM | POA: Insufficient documentation

## 2011-06-26 HISTORY — PX: CARDIOVERSION: SHX1299

## 2011-06-26 LAB — CBC
HCT: 39.6 % (ref 36.0–46.0)
Hemoglobin: 13.2 g/dL (ref 12.0–15.0)
MCH: 30.3 pg (ref 26.0–34.0)
MCV: 91 fL (ref 78.0–100.0)
RBC: 4.35 MIL/uL (ref 3.87–5.11)
WBC: 6.6 10*3/uL (ref 4.0–10.5)

## 2011-06-26 SURGERY — CARDIOVERSION
Anesthesia: Monitor Anesthesia Care | Wound class: Clean

## 2011-06-26 MED ORDER — SODIUM CHLORIDE 0.9 % IV SOLN
INTRAVENOUS | Status: DC | PRN
  Administered 2011-06-26: 11:00:00 via INTRAVENOUS

## 2011-06-26 MED ORDER — PROPOFOL 10 MG/ML IV EMUL
INTRAVENOUS | Status: DC | PRN
  Administered 2011-06-26: 30 mg via INTRAVENOUS

## 2011-06-26 MED ORDER — SODIUM CHLORIDE 0.9 % IJ SOLN: 3.0000 mL | INTRAMUSCULAR | Status: DC | PRN

## 2011-06-26 MED ORDER — SODIUM CHLORIDE 0.9 % IV SOLN: 250.0000 mL | INTRAVENOUS | Status: DC

## 2011-06-26 MED ORDER — SODIUM CHLORIDE 0.9 % IJ SOLN: 3.0000 mL | Freq: Two times a day (BID) | INTRAMUSCULAR | Status: DC

## 2011-06-26 MED ORDER — HYDROCORTISONE 1 % EX CREA: 1.0000 "application " | TOPICAL_CREAM | Freq: Three times a day (TID) | CUTANEOUS | Status: DC | PRN

## 2011-06-26 MED ORDER — SODIUM CHLORIDE 0.9 % IV SOLN
INTRAVENOUS | Status: DC
  Administered 2011-06-26: 11:00:00 via INTRAVENOUS

## 2011-06-26 NOTE — Transfer of Care (Signed)
Immediate Anesthesia Transfer of Care Note  Patient: Julie Rich  Procedure(s) Performed:  CARDIOVERSION  Patient Location: PACU and Short Stay  Anesthesia Type: MAC  Level of Consciousness: sedated and patient cooperative  Airway & Oxygen Therapy: Patient Spontanous Breathing and Patient connected to nasal cannula oxygen  Post-op Assessment: Report given to PACU RN, Post -op Vital signs reviewed and stable and Patient moving all extremities X 4  Post vital signs: Reviewed and stable  Complications: No apparent anesthesia complications

## 2011-06-26 NOTE — Procedures (Signed)
Electrical Cardioversion Procedure Note Julie Rich [id number] [birth year]  Procedure: Electrical Cardioversion Indications:  Atrial Fibrillation  Procedure Details  Consent: Risks of procedure as well as the alternatives and risks of each were explained to the (patient/caregiver).  Consent for procedure obtained. Time Out: Verified patient identification, verified procedure, site/side was marked, verified correct patient position, special equipment/implants available, medications/allergies/relevent history reviewed, required imaging and test results available.  Performed  Patient has been therapeutic on coumadin for an appropriate interval prior to cardioversion.   Patient placed on cardiac monitor, pulse oximetry, supplemental oxygen as necessary.  Sedation given: Propofol Pacer pads placed anterior and posterior chest.  Cardioverted 1 time(s).  Cardioverted at 150J.  Evaluation Findings: Post procedure EKG shows: NSR Complications: None Patient did tolerate procedure well.  Propafenone level to be sent off.    Julie Rich 06/26/2011, 11:47 AM

## 2011-06-26 NOTE — Interval H&P Note (Signed)
History and Physical Interval Note:  06/26/2011 11:47 AM  Julie Rich  has presented today for surgery, with the diagnosis of a fib  The various methods of treatment have been discussed with the patient and family. After consideration of risks, benefits and other options for treatment, the patient has consented to  Procedure(s): CARDIOVERSION as a surgical intervention .  The patients' history has been reviewed, patient examined, no change in status, stable for surgery.  I have reviewed the patients' chart and labs.  Questions were answered to the patient's satisfaction.     Gwenivere Hiraldo [employer]

## 2011-06-26 NOTE — Anesthesia Preprocedure Evaluation (Addendum)
Anesthesia Evaluation  Patient identified by MRN, date of birth, ID band Patient awake    Reviewed: Allergy & Precautions, H&P , NPO status , Patient's Chart, lab work & pertinent test results, reviewed documented beta blocker date and time   History of Anesthesia Complications (+) AWARENESS UNDER ANESTHESIA  Airway Mallampati: II TM Distance: >3 FB     Dental  (+) Teeth Intact and Dental Advisory Given   Pulmonary shortness of breath and with exertion, asthma , COPD COPD inhaler,          Cardiovascular hypertension, Pt. on home beta blockers and Pt. on medications +CHF + dysrhythmias Atrial Fibrillation     Neuro/Psych Depression CVA, No Residual Symptoms    GI/Hepatic GERD-  Medicated,  Endo/Other  Hypothyroidism   Renal/GU      Musculoskeletal   Abdominal   Peds  Hematology   Anesthesia Other Findings   Reproductive/Obstetrics                         Anesthesia Physical Anesthesia Plan  ASA: III  Anesthesia Plan: General   Post-op Pain Management:    Induction: Intravenous  Airway Management Planned: Mask  Additional Equipment:   Intra-op Plan:   Post-operative Plan:   Informed Consent: I have reviewed the patients History and Physical, chart, labs and discussed the procedure including the risks, benefits and alternatives for the proposed anesthesia with the patient or authorized representative who has indicated his/her understanding and acceptance.     Plan Discussed with: Surgeon, CRNA and Anesthesiologist  Anesthesia Plan Comments:         Anesthesia Quick Evaluation

## 2011-06-26 NOTE — H&P (View-Only) (Signed)
HPI The patient presents for followup of atrial fibrillation. She was recently hospitalized with atrial fibrillation/flutter and diastolic dysfunction. Prior to that she had been seen in the office and her Propac unknown dose increased. We have been awaiting a propafenone level to decide on further treatment.  She has remained in atrial fibrillation though it has been somewhat slow and at times have represented an atypical flutter. Since going home she remains quite weak. She's had some low blood pressures. She has had some balance issues. She's having some shortness of breath but not as acute as she was on the day of admission. There is no PND or orthopnea. She's not had any syncope. She's not describing chest pressure, neck or arm discomfort. She's not having any weight gain or edema.  Allergies  Allergen Reactions  . Ciprofloxacin     unknown  . Quinolones     unknown  . Sulfonamide Derivatives     unknown    Current Outpatient Prescriptions  Medication Sig Dispense Refill  . albuterol (PROVENTIL,VENTOLIN) 90 MCG/ACT inhaler Inhale 2 puffs into the lungs every 6 (six) hours as needed. For shortness of breath      . ALPRAZolam (XANAX) 0.25 MG tablet Take 0.25 mg by mouth as needed. For anxiety      . amLODipine (NORVASC) 10 MG tablet Take 10 mg by mouth daily.        Marland Kitchen atorvastatin (LIPITOR) 10 MG tablet 20 mg. Pt takes a half of a 20 mg tab every other day      . Fluticasone-Salmeterol (ADVAIR DISKUS) 250-50 MCG/DOSE AEPB Inhale 1 puff into the lungs 2 (two) times daily.       . furosemide (LASIX) 40 MG tablet Take 1 tablet (40 mg total) by mouth daily.  30 tablet  6  . levothyroxine (SYNTHROID, LEVOTHROID) 25 MCG tablet Take 25 mcg by mouth daily.        Marland Kitchen losartan (COZAAR) 100 MG tablet Take 100 mg by mouth daily.        . metoprolol (TOPROL-XL) 100 MG 24 hr tablet Take 100 mg by mouth daily.        . potassium chloride (K-DUR,KLOR-CON) 10 MEQ tablet Take 1 tablet (10 mEq total) by  mouth daily.  30 tablet  6  . propafenone (RYTHMOL) 225 MG tablet Take 1 tablet (225 mg total) by mouth 3 (three) times daily.  90 tablet  6  . temazepam (RESTORIL) 15 MG capsule Take 15 mg by mouth at bedtime.        Marland Kitchen tiotropium (SPIRIVA) 18 MCG inhalation capsule Place 18 mcg into inhaler and inhale daily.        Marland Kitchen warfarin (COUMADIN) 4 MG tablet Take 4 mg by mouth 4 (four) times a week. As directed        Past Medical History  Diagnosis Date  . HTN (hypertension)   . A-fib   . Dyslipidemia   . Hypothyroidism   . Stroke   . Asthma   . Shortness of breath   . DEMENTIA   . CHF (congestive heart failure)   . GERD (gastroesophageal reflux disease)   . Anxiety   . COPD (chronic obstructive pulmonary disease)     Past Surgical History  Procedure Date  . Total abdominal hysterectomy   . Breast surgery     45-50 YEARS AGO LUMP REMOVED  . Appendectomy     ROS:  As stated in the HPI and negative for all other systems.  PHYSICAL EXAM BP 98/64  Pulse 76  Resp 16  Ht 5' (1.524 m)  Wt 114 lb (51.71 kg)  BMI 22.26 kg/m2 GENERAL:  Well appearing HEENT:  Pupils equal round and reactive, fundi not visualized, oral mucosa unremarkable NECK:  No jugular venous distention, waveform within normal limits, carotid upstroke brisk and symmetric, no bruits, no thyromegaly LYMPHATICS:  No cervical, inguinal adenopathy LUNGS:  Clear to auscultation bilaterally BACK:  No CVA tenderness CHEST:  Unremarkable HEART:  PMI not displaced or sustained,S1 and S2 within normal limits, no S3, no S4, no clicks, no rubs, no murmurs ABD:  Flat, positive bowel sounds normal in frequency in pitch, no bruits, no rebound, no guarding, no midline pulsatile mass, no hepatomegaly, no splenomegaly EXT:  2 plus pulses throughout, no edema, no cyanosis no clubbing SKIN:  No rashes no nodules NEURO:  Cranial nerves II through XII grossly intact, motor grossly intact throughout PSYCH:  Cognitively intact, oriented  to person place and time  EKG:  Atrial fibrillation vs. Atypical flutter with controlled ventricular rate 81. 06/19/2011  ASSESSMENT AND PLAN

## 2011-06-26 NOTE — Anesthesia Postprocedure Evaluation (Signed)
  Anesthesia Post-op Note  Patient: Julie Rich  Procedure(s) Performed:  CARDIOVERSION  Patient Location: PACU  Anesthesia Type: MAC  Level of Consciousness: awake, alert , oriented and patient cooperative  Airway and Oxygen Therapy: Patient Spontanous Breathing and Patient connected to nasal cannula oxygen  Post-op Pain: none  Post-op Assessment: Post-op Vital signs reviewed and Patient's Cardiovascular Status Stable  Post-op Vital Signs: Reviewed and stable  Complications: No apparent anesthesia complications

## 2011-06-28 ENCOUNTER — Encounter (HOSPITAL_COMMUNITY): Payer: Self-pay | Admitting: Cardiology

## 2011-07-03 ENCOUNTER — Emergency Department (HOSPITAL_COMMUNITY): Payer: Medicare Other

## 2011-07-03 ENCOUNTER — Observation Stay (HOSPITAL_COMMUNITY)
Admission: EM | Admit: 2011-07-03 | Discharge: 2011-07-05 | Disposition: A | Discharge status: Home / Self Care | Payer: Medicare Other | Attending: Cardiology | Admitting: Cardiology

## 2011-07-03 ENCOUNTER — Ambulatory Visit (INDEPENDENT_AMBULATORY_CARE_PROVIDER_SITE_OTHER): Payer: Medicare Other | Admitting: Cardiology

## 2011-07-03 ENCOUNTER — Other Ambulatory Visit: Payer: Self-pay

## 2011-07-03 ENCOUNTER — Encounter: Payer: Self-pay | Admitting: Cardiology

## 2011-07-03 ENCOUNTER — Encounter (HOSPITAL_COMMUNITY): Payer: Self-pay | Admitting: *Deleted

## 2011-07-03 DIAGNOSIS — E785 Hyperlipidemia, unspecified: Secondary | ICD-10-CM | POA: Insufficient documentation

## 2011-07-03 DIAGNOSIS — I5033 Acute on chronic diastolic (congestive) heart failure: Secondary | ICD-10-CM | POA: Insufficient documentation

## 2011-07-03 DIAGNOSIS — J449 Chronic obstructive pulmonary disease, unspecified: Secondary | ICD-10-CM | POA: Insufficient documentation

## 2011-07-03 DIAGNOSIS — K219 Gastro-esophageal reflux disease without esophagitis: Secondary | ICD-10-CM | POA: Insufficient documentation

## 2011-07-03 DIAGNOSIS — I10 Essential (primary) hypertension: Secondary | ICD-10-CM | POA: Insufficient documentation

## 2011-07-03 DIAGNOSIS — Z7901 Long term (current) use of anticoagulants: Secondary | ICD-10-CM | POA: Insufficient documentation

## 2011-07-03 DIAGNOSIS — E039 Hypothyroidism, unspecified: Secondary | ICD-10-CM | POA: Insufficient documentation

## 2011-07-03 DIAGNOSIS — R42 Dizziness and giddiness: Secondary | ICD-10-CM | POA: Insufficient documentation

## 2011-07-03 DIAGNOSIS — I4891 Unspecified atrial fibrillation: Secondary | ICD-10-CM | POA: Insufficient documentation

## 2011-07-03 DIAGNOSIS — J4489 Other specified chronic obstructive pulmonary disease: Secondary | ICD-10-CM | POA: Insufficient documentation

## 2011-07-03 DIAGNOSIS — Z8673 Personal history of transient ischemic attack (TIA), and cerebral infarction without residual deficits: Secondary | ICD-10-CM | POA: Insufficient documentation

## 2011-07-03 DIAGNOSIS — I509 Heart failure, unspecified: Secondary | ICD-10-CM

## 2011-07-03 DIAGNOSIS — F411 Generalized anxiety disorder: Secondary | ICD-10-CM | POA: Insufficient documentation

## 2011-07-03 DIAGNOSIS — I951 Orthostatic hypotension: Secondary | ICD-10-CM | POA: Insufficient documentation

## 2011-07-03 DIAGNOSIS — R079 Chest pain, unspecified: Principal | ICD-10-CM | POA: Insufficient documentation

## 2011-07-03 DIAGNOSIS — F0390 Unspecified dementia without behavioral disturbance: Secondary | ICD-10-CM | POA: Insufficient documentation

## 2011-07-03 LAB — CBC
HCT: 40.4 % (ref 36.0–46.0)
Hemoglobin: 13.5 g/dL (ref 12.0–15.0)
MCH: 30.4 pg (ref 26.0–34.0)
MCHC: 33.4 g/dL (ref 30.0–36.0)
RBC: 4.44 MIL/uL (ref 3.87–5.11)

## 2011-07-03 LAB — DIFFERENTIAL
Basophils Relative: 1 % (ref 0–1)
Lymphs Abs: 1.2 10*3/uL (ref 0.7–4.0)
Monocytes Absolute: 0.6 10*3/uL (ref 0.1–1.0)
Monocytes Relative: 11 % (ref 3–12)
Neutro Abs: 3.4 10*3/uL (ref 1.7–7.7)

## 2011-07-03 LAB — POCT I-STAT, CHEM 8
Calcium, Ion: 1.18 mmol/L (ref 1.12–1.32)
Chloride: 104 mEq/L (ref 96–112)
Creatinine, Ser: 1.3 mg/dL — ABNORMAL HIGH (ref 0.50–1.10)
Glucose, Bld: 103 mg/dL — ABNORMAL HIGH (ref 70–99)
HCT: 44 % (ref 36.0–46.0)

## 2011-07-03 MED ORDER — ASPIRIN 81 MG PO CHEW
324.0000 mg | CHEWABLE_TABLET | Freq: Once | ORAL | Status: AC
  Administered 2011-07-03: 324 mg via ORAL
  Filled 2011-07-03: qty 4

## 2011-07-03 MED ORDER — PROPAFENONE HCL 225 MG PO TABS: 225.0000 mg | ORAL_TABLET | Freq: Three times a day (TID) | ORAL | Status: DC

## 2011-07-03 MED ORDER — NITROGLYCERIN 0.4 MG SL SUBL
0.4000 mg | SUBLINGUAL_TABLET | SUBLINGUAL | Status: DC | PRN
  Administered 2011-07-04: 0.4 mg via SUBLINGUAL

## 2011-07-03 MED ORDER — POTASSIUM CHLORIDE CRYS ER 10 MEQ PO TBCR: 5.0000 meq | EXTENDED_RELEASE_TABLET | Freq: Every day | ORAL | Status: DC

## 2011-07-03 NOTE — Progress Notes (Signed)
HPI The patient presents for followup of atrial fibrillation. Since I last saw her she had cardioversion.  Since that time she has been complaining of some dizziness. This happens more with standing. She's not having this at rest. She's not had any presyncope or syncope. She's not noticing any tachycardia palpitations. She's had no chest pressure, neck or arm discomfort. She denies any weight gain or edema. She's had no cough fevers or chills.  Allergies  Allergen Reactions  . Ciprofloxacin     unknown  . Quinolones     unknown  . Sulfonamide Derivatives     unknown    Current Outpatient Prescriptions  Medication Sig Dispense Refill  . albuterol (PROVENTIL,VENTOLIN) 90 MCG/ACT inhaler Inhale 2 puffs into the lungs every 6 (six) hours as needed. For shortness of breath      . ALPRAZolam (XANAX) 0.25 MG tablet Take 0.25 mg by mouth daily as needed. For anxiety      . amLODipine (NORVASC) 10 MG tablet Take 5 mg by mouth daily.      Marland Kitchen atorvastatin (LIPITOR) 20 MG tablet Take 10 mg by mouth every other day.      . Fluticasone-Salmeterol (ADVAIR DISKUS) 250-50 MCG/DOSE AEPB Inhale 1 puff into the lungs 2 (two) times daily.       . furosemide (LASIX) 40 MG tablet Take 20 mg by mouth daily.      Marland Kitchen levothyroxine (SYNTHROID, LEVOTHROID) 25 MCG tablet Take 25 mcg by mouth daily.       Marland Kitchen losartan (COZAAR) 100 MG tablet Take 100 mg by mouth daily.       . metoprolol (TOPROL-XL) 100 MG 24 hr tablet Take 100 mg by mouth daily.       . polyethylene glycol (MIRALAX / GLYCOLAX) packet Take 17 g by mouth daily.      . propafenone (RYTHMOL) 225 MG tablet Take 225 mg by mouth 3 (three) times daily.      Marland Kitchen senna-docusate (SENOKOT-S) 8.6-50 MG per tablet Take 3 tablets by mouth daily as needed. For constipation      . temazepam (RESTORIL) 15 MG capsule Take 15 mg by mouth at bedtime.       Marland Kitchen tiotropium (SPIRIVA) 18 MCG inhalation capsule Place 18 mcg into inhaler and inhale daily.       Marland Kitchen warfarin  (COUMADIN) 4 MG tablet Take 2-4 mg by mouth daily. Take 2mg  on Mon, Wed, and Fri.  Take 4mg  all other days.      . potassium chloride (K-DUR,KLOR-CON) 10 MEQ tablet Take 5 mEq by mouth daily.        Past Medical History  Diagnosis Date  . HTN (hypertension)   . A-fib   . Dyslipidemia   . Hypothyroidism   . Stroke   . Asthma   . Shortness of breath   . DEMENTIA   . CHF (congestive heart failure)   . GERD (gastroesophageal reflux disease)   . Anxiety   . COPD (chronic obstructive pulmonary disease)     Past Surgical History  Procedure Date  . Total abdominal hysterectomy   . Breast surgery     45-50 YEARS AGO LUMP REMOVED  . Appendectomy   . Cardioversion 06/26/2011    Procedure: CARDIOVERSION;  Surgeon: Marca Ancona, MD;  Location: [facility] OR;  Service: Cardiovascular;  Laterality: N/A;    ROS:  As stated in the HPI and negative for all other systems.  PHYSICAL EXAM BP 110/55  Pulse 66  Ht 5'  1" (1.549 m)  Wt 113 lb (51.256 kg)  BMI 21.35 kg/m2 GENERAL:  Well appearing HEENT:  Pupils equal round and reactive, fundi not visualized, oral mucosa unremarkable NECK:  No jugular venous distention, waveform within normal limits, carotid upstroke brisk and symmetric, no bruits, no thyromegaly LYMPHATICS:  No cervical, inguinal adenopathy LUNGS:  Clear to auscultation bilaterally BACK:  No CVA tenderness CHEST:  Unremarkable HEART:  PMI not displaced or sustained,S1 and S2 within normal limits, no S3, no S4, no clicks, no rubs, no murmurs ABD:  Flat, positive bowel sounds normal in frequency in pitch, no bruits, no rebound, no guarding, no midline pulsatile mass, no hepatomegaly, no splenomegaly EXT:  2 plus pulses throughout, no edema, no cyanosis no clubbing SKIN:  No rashes no nodules NEURO:  Cranial nerves II through XII grossly intact, motor grossly intact throughout PSYCH:  Cognitively intact, oriented to person place and time  EKG:  Sinus rhythm, rate 62, first degree AV  block, left ventricle hypertrophy, left axis deviation, RSR prime V1 and V2, no acute ST-T wave changes 07/03/2011   ASSESSMENT AND PLAN

## 2011-07-03 NOTE — ED Notes (Addendum)
Here by EMS, here for "chest tightness", onset this evening, denies CP or other sx, seen by Dr. Antoine Poche today, had cardioversion last week, "feels better after taking her xanax". NSR on monitor, HR 73. alert, NAD, calm, interactive. Lasix d/c'd today d/t dizziness. See paper report on chart.

## 2011-07-03 NOTE — Patient Instructions (Addendum)
Please stop your furosemide (Lasix).  Continue all other medications as listed.  Follow up in 1 month with Dr Antoine Poche.

## 2011-07-03 NOTE — ED Provider Notes (Signed)
History     CSN: [id number]  Arrival date & time 07/03/11  2238   First MD Initiated Contact with Patient 07/03/11 2253      Chief Complaint  Patient presents with  . Chest Pain    (Consider location/radiation/quality/duration/timing/severity/associated sxs/prior treatment) Patient is a 78 y.o. female presenting with chest pain.  Chest Pain    patient is an 78 year old female who presents today complaining of chest heaviness that began acutely at 8 PM. Patient was lying in bed when this began. Patient didn't check her blood pressure which was 190 systolic. Patient decided to take Xanax for this. Her symptoms did not go away and she called EMS. Patient does have a history of atrial fibrillation and actually just underwent cardioversion for this recently. She was seen this morning by her cardiologist, Dr. Antoine Poche, in his office.  At that time patient had sinus rhythm on her EKG per the physician noted. She was not having the heaviness in her chest at this time. Patient said she has been on Coumadin since they cardioverted her. Patient states that her symptoms today do not feel like when it occurred just recently. She says the heaviness in her chest which was initially a 10 out of 10 and is now a 6/10. She has not had aspirin if she refused this because she is on Coumadin. She does have a history of COPD but denies any recent cough or shortness of breath. Patient denies any nausea, vomiting, or diaphoresis. Blood pressure is 152/81 here. She has not had any nitroglycerin. Patient is in atrial fibrillation on presentation. She does not have any history of coronary artery disease. There are no other associated or modifying factors.  Past Medical History  Diagnosis Date  . HTN (hypertension)   . A-fib   . Dyslipidemia   . Hypothyroidism   . Stroke   . Asthma   . Shortness of breath   . DEMENTIA   . CHF (congestive heart failure)   . GERD (gastroesophageal reflux disease)   . Anxiety   .  COPD (chronic obstructive pulmonary disease)     Past Surgical History  Procedure Date  . Total abdominal hysterectomy   . Breast surgery     45-50 YEARS AGO LUMP REMOVED  . Appendectomy   . Cardioversion 06/26/2011    Procedure: CARDIOVERSION;  Surgeon: Marca Ancona, MD;  Location: [facility] OR;  Service: Cardiovascular;  Laterality: N/A;    Family History  Problem Relation Age of Onset  . Coronary artery disease Neg Hx   . Stroke Brother   . Breast cancer Mother     History  Substance Use Topics  . Smoking status: Never Smoker   . Smokeless tobacco: Not on file   Comment: does not smoke   . Alcohol Use: No    OB History    Grav Para Term Preterm Abortions TAB SAB Ect Mult Living                  Review of Systems  Constitutional: Negative.   HENT: Negative.   Eyes: Negative.   Respiratory: Negative.   Cardiovascular: Positive for chest pain.  Gastrointestinal: Negative.   Genitourinary: Negative.   Musculoskeletal: Negative.   Skin: Negative.   Neurological: Negative.   Hematological: Negative.   Psychiatric/Behavioral: Negative.   All other systems reviewed and are negative.    Allergies  Ciprofloxacin; Quinolones; and Sulfonamide derivatives  Home Medications   Current Outpatient Rx  Name Route Sig Dispense Refill  .  ALBUTEROL 90 MCG/ACT IN AERS Inhalation Inhale 2 puffs into the lungs every 6 (six) hours as needed. For shortness of breath    . ALPRAZOLAM 0.25 MG PO TABS Oral Take 0.25 mg by mouth daily as needed. For anxiety    . AMLODIPINE BESYLATE 10 MG PO TABS Oral Take 5 mg by mouth daily.    . ATORVASTATIN CALCIUM 20 MG PO TABS Oral Take 10 mg by mouth every other day.    Marland Kitchen FLUTICASONE-SALMETEROL 250-50 MCG/DOSE IN AEPB Inhalation Inhale 1 puff into the lungs 2 (two) times daily.     . FUROSEMIDE 40 MG PO TABS Oral Take 40 mg by mouth daily.    Marland Kitchen LEVOTHYROXINE SODIUM 25 MCG PO TABS Oral Take 25 mcg by mouth daily.     Marland Kitchen LOSARTAN POTASSIUM 100 MG PO  TABS Oral Take 100 mg by mouth daily.     Marland Kitchen METOPROLOL SUCCINATE ER 100 MG PO TB24 Oral Take 100 mg by mouth daily.     Marland Kitchen POLYETHYLENE GLYCOL 3350 PO PACK Oral Take 17 g by mouth daily.    Marland Kitchen POTASSIUM CHLORIDE CRYS ER 20 MEQ PO TBCR Oral Take 10-20 mEq by mouth 2 (two) times daily. 10 meq in the morning and 20 meq in the evening    . PROPAFENONE HCL 225 MG PO TABS Oral Take 1 tablet (225 mg total) by mouth 3 (three) times daily. 270 tablet 3  . SENNOSIDES-DOCUSATE SODIUM 8.6-50 MG PO TABS Oral Take 3 tablets by mouth daily as needed. For constipation    . TEMAZEPAM 15 MG PO CAPS Oral Take 15 mg by mouth at bedtime.     Marland Kitchen TIOTROPIUM BROMIDE MONOHYDRATE 18 MCG IN CAPS Inhalation Place 18 mcg into inhaler and inhale daily.     . WARFARIN SODIUM 4 MG PO TABS Oral Take 2-4 mg by mouth daily. Take 2mg  on Mon, Wed, and Fri.  Take 4mg  all other days.      BP 152/81  Pulse 72  Temp(Src) 98.9 F (37.2 C) (Oral)  Resp 25  SpO2 100%  Physical Exam  Nursing note and vitals reviewed. Constitutional: She is oriented to person, place, and time. She appears well-developed and well-nourished. No distress.  HENT:  Head: Normocephalic and atraumatic.  Eyes: Conjunctivae and EOM are normal. Pupils are equal, round, and reactive to light.  Neck: Normal range of motion.  Cardiovascular: Normal rate, S1 normal and S2 normal.  An irregularly irregular rhythm present. PMI is not displaced.   Pulmonary/Chest: Effort normal and breath sounds normal. No respiratory distress. She has no wheezes. She has no rales.  Abdominal: Soft. Bowel sounds are normal. She exhibits no distension. There is no tenderness. There is no rebound and no guarding.  Musculoskeletal: Normal range of motion. She exhibits no edema.  Neurological: She is alert and oriented to person, place, and time. No cranial nerve deficit. She exhibits normal muscle tone. Coordination normal.  Skin: Skin is warm and dry. No rash noted.  Psychiatric: She  has a normal mood and affect.    ED Course  Procedures (including critical care time)   Date: 07/03/2011  Rate: 69  Rhythm: atrial fibrillation  QRS Axis: normal  Intervals: normal  ST/T Wave abnormalities: ST elevations inferiorly  Conduction Disutrbances:none  Narrative Interpretation: EKG from earlier today is not available. Per report and cardiologist note patient did not have any evidence of ST depression at that time. Patient does appear to have some ST depression in leads  2,3 and aVF. They're given baseline this may be artifact. She is also now in atrial fibrillation rather than sinus rhythm. This is also different from reported EKG earlier today. EKG available for comparison was from 06/26/2011. These changes are distally noted from 06/26/2011  Old EKG Reviewed: changes noted   Date: 07/03/2011 #2,   Rate: 72  Rhythm: normal sinus rhythm  QRS Axis: normal  Intervals: PR prolonged  ST/T Wave abnormalities: nonspecific T wave changes  Conduction Disutrbances:left anterior fascicular block  Narrative Interpretation: Previously viewed possible ST segment depressions in 2, 3, and aVF now no longer seen. This is thought to be based on improved quality of EKG were rather than an adynamic EKG change. Patient has nonspecific T wave changes that are consistent with prior EKG of 06/26/2011  Old EKG Reviewed: changes noted   Labs Reviewed  POCT I-STAT, CHEM 8 - Abnormal; Notable for the following:    BUN 27 (*)    Creatinine, Ser 1.30 (*)    Glucose, Bld 103 (*)    All other components within normal limits  CBC  DIFFERENTIAL  POCT I-STAT TROPONIN I  PRO B NATRIURETIC PEPTIDE  PROTIME-INR   Dg Chest Portable 1 View  07/03/2011  *RADIOLOGY REPORT*  Clinical Data: Chest pain, shortness of breath  PORTABLE CHEST - 1 VIEW  Comparison: 06/09/2011  Findings: Interval improvement in the bibasilar opacities seen previously.  No definite effusion. Mild cardiomegaly.  Atheromatous aortic arch.   IMPRESSION:  1.  Improved aeration since previous exam. 2.  Stable mild cardiomegaly.  Original Report Authenticated By: Thora Lance III, M.D.     1. Chest pain       MDM  Patient presented today with chest pain that has been different than sensations she is had with prior episodes of A. fib. She has no known history of coronary artery disease. Patient does have COPD but this was also different than that. She was given aspirin here. Patient did have 6/10 chest heaviness upon presentation. Initial EKG showed the patient had returned to atrial fibrillation though she was in sinus rhythm on last EKG as well as at her physician's office today. There was concern for slight ST depression in leads 23 and aVF on today's EKG. Patient was hemodynamically stable. Nitroglycerin was given. Initial chest x-ray was improved from previous no signs of pneumonia or significant changes otherwise. Laboratory workup is pending at this time.  1:32 AM Patient discussed with cardiology fellow. Given patient's age as well as her persistent chest pressure patient will be admitted. She does report that her pain is now down to a 3-4/10. Vital signs are main stable. Patient will be admitted for further management to cardiology.        Cyndra Numbers, MD 07/04/11 [phone]

## 2011-07-03 NOTE — Assessment & Plan Note (Signed)
I will otherwise continue the meds as listed for management of her blood pressure.

## 2011-07-03 NOTE — ED Notes (Signed)
Portable CXR completed.

## 2011-07-03 NOTE — Assessment & Plan Note (Signed)
I am managing this with rhythm control salt and fluid restriction and blood pressure control.

## 2011-07-03 NOTE — Assessment & Plan Note (Signed)
She did have significant orthostatic blood pressure drop. I'm going to have to discontinue her diuretics which is going to make management of her diastolic dysfunction somewhat more difficult. We have reviewed fluid and salt restriction previously.

## 2011-07-03 NOTE — Assessment & Plan Note (Signed)
She is maintaining sinus rhythm. I'm waiting for the most recent propafenone level.  I want to make sure the dose is appropriate with a therapeutic level. She will continue on current regimen for now.

## 2011-07-04 ENCOUNTER — Encounter (HOSPITAL_COMMUNITY): Payer: Self-pay | Admitting: Internal Medicine

## 2011-07-04 DIAGNOSIS — R079 Chest pain, unspecified: Secondary | ICD-10-CM

## 2011-07-04 LAB — CARDIAC PANEL(CRET KIN+CKTOT+MB+TROPI)
CK, MB: 2.2 ng/mL (ref 0.3–4.0)
CK, MB: 2.4 ng/mL (ref 0.3–4.0)
CK, MB: 2.5 ng/mL (ref 0.3–4.0)
Relative Index: INVALID (ref 0.0–2.5)
Total CK: 38 U/L (ref 7–177)
Troponin I: <0.3 ng/mL (ref <0.30)
Troponin I: <0.3 ng/mL (ref <0.30)

## 2011-07-04 LAB — TSH: TSH: 2.585 u[IU]/mL (ref 0.350–4.500)

## 2011-07-04 LAB — PROTIME-INR
INR: 3.33 — ABNORMAL HIGH (ref 0.00–1.49)
Prothrombin Time: 34.3 seconds — ABNORMAL HIGH (ref 11.6–15.2)

## 2011-07-04 MED ORDER — ONDANSETRON HCL 4 MG/2ML IJ SOLN: 4.0000 mg | Freq: Four times a day (QID) | INTRAMUSCULAR | Status: DC | PRN

## 2011-07-04 MED ORDER — ACETAMINOPHEN 325 MG PO TABS
650.0000 mg | ORAL_TABLET | ORAL | Status: DC | PRN
Start: 2011-07-04 — End: 2011-07-05

## 2011-07-04 MED ORDER — FUROSEMIDE 40 MG PO TABS
40.0000 mg | ORAL_TABLET | Freq: Every day | ORAL | Status: DC
  Filled 2011-07-04: qty 1

## 2011-07-04 MED ORDER — AMLODIPINE BESYLATE 5 MG PO TABS
5.0000 mg | ORAL_TABLET | Freq: Every day | ORAL | Status: DC
  Administered 2011-07-04 – 2011-07-05 (×2): 5 mg via ORAL
  Filled 2011-07-04 (×2): qty 1

## 2011-07-04 MED ORDER — ZOLPIDEM TARTRATE 5 MG PO TABS: 5.0000 mg | ORAL_TABLET | Freq: Every day | ORAL | Status: DC

## 2011-07-04 MED ORDER — NITROGLYCERIN 0.4 MG SL SUBL: 0.4000 mg | SUBLINGUAL_TABLET | SUBLINGUAL | Status: DC | PRN

## 2011-07-04 MED ORDER — ALPRAZOLAM 0.25 MG PO TABS: 0.2500 mg | ORAL_TABLET | Freq: Every day | ORAL | Status: DC | PRN

## 2011-07-04 MED ORDER — LOSARTAN POTASSIUM 50 MG PO TABS
100.0000 mg | ORAL_TABLET | Freq: Every day | ORAL | Status: DC
  Administered 2011-07-04 – 2011-07-05 (×2): 100 mg via ORAL
  Filled 2011-07-04 (×2): qty 2

## 2011-07-04 MED ORDER — TIOTROPIUM BROMIDE MONOHYDRATE 18 MCG IN CAPS
18.0000 ug | ORAL_CAPSULE | Freq: Every day | RESPIRATORY_TRACT | Status: DC
  Administered 2011-07-04 – 2011-07-05 (×2): 18 ug via RESPIRATORY_TRACT
  Filled 2011-07-04: qty 5

## 2011-07-04 MED ORDER — TEMAZEPAM 15 MG PO CAPS
15.0000 mg | ORAL_CAPSULE | Freq: Every evening | ORAL | Status: DC | PRN
  Administered 2011-07-04: 15 mg via ORAL
  Filled 2011-07-04: qty 1

## 2011-07-04 MED ORDER — LEVOTHYROXINE SODIUM 25 MCG PO TABS
25.0000 ug | ORAL_TABLET | Freq: Every day | ORAL | Status: DC
  Administered 2011-07-04 – 2011-07-05 (×2): 25 ug via ORAL
  Filled 2011-07-04 (×3): qty 1

## 2011-07-04 MED ORDER — SODIUM CHLORIDE 0.9 % IV SOLN
INTRAVENOUS | Status: DC
  Administered 2011-07-04: 04:00:00 via INTRAVENOUS

## 2011-07-04 MED ORDER — ASPIRIN EC 81 MG PO TBEC
81.0000 mg | DELAYED_RELEASE_TABLET | Freq: Every day | ORAL | Status: DC
  Administered 2011-07-05: 81 mg via ORAL
  Filled 2011-07-04: qty 1

## 2011-07-04 MED ORDER — ROSUVASTATIN CALCIUM 5 MG PO TABS
5.0000 mg | ORAL_TABLET | ORAL | Status: DC
  Administered 2011-07-04: 5 mg via ORAL
  Filled 2011-07-04: qty 1

## 2011-07-04 MED ORDER — METOPROLOL SUCCINATE ER 100 MG PO TB24
100.0000 mg | ORAL_TABLET | Freq: Every day | ORAL | Status: DC
  Administered 2011-07-04 – 2011-07-05 (×2): 100 mg via ORAL
  Filled 2011-07-04 (×2): qty 1

## 2011-07-04 MED ORDER — FLUTICASONE-SALMETEROL 250-50 MCG/DOSE IN AEPB
1.0000 | INHALATION_SPRAY | Freq: Two times a day (BID) | RESPIRATORY_TRACT | Status: DC
  Administered 2011-07-04 – 2011-07-05 (×3): 1 via RESPIRATORY_TRACT
  Filled 2011-07-04: qty 14

## 2011-07-04 MED ORDER — POLYETHYLENE GLYCOL 3350 17 G PO PACK
17.0000 g | PACK | Freq: Every day | ORAL | Status: DC | PRN
  Filled 2011-07-04: qty 1

## 2011-07-04 MED ORDER — SENNOSIDES-DOCUSATE SODIUM 8.6-50 MG PO TABS
3.0000 | ORAL_TABLET | Freq: Every evening | ORAL | Status: DC | PRN
  Filled 2011-07-04: qty 3

## 2011-07-04 MED ORDER — ALBUTEROL SULFATE HFA 108 (90 BASE) MCG/ACT IN AERS
2.0000 | INHALATION_SPRAY | Freq: Four times a day (QID) | RESPIRATORY_TRACT | Status: DC | PRN
  Filled 2011-07-04: qty 6.7

## 2011-07-04 MED ORDER — PROPAFENONE HCL 225 MG PO TABS
225.0000 mg | ORAL_TABLET | Freq: Three times a day (TID) | ORAL | Status: DC
  Administered 2011-07-04 – 2011-07-05 (×4): 225 mg via ORAL
  Filled 2011-07-04 (×7): qty 1

## 2011-07-04 MED ORDER — TEMAZEPAM 15 MG PO CAPS: 15.0000 mg | ORAL_CAPSULE | Freq: Every day | ORAL | Status: DC

## 2011-07-04 NOTE — ED Notes (Signed)
No changes. Alert, NAD calm, NSR on monitor, VSS.

## 2011-07-04 NOTE — Progress Notes (Signed)
Called by RN because pt on coumadin PTA but not ordered in the hospital. Notes reviewed, currently no invasive workup planned so will restart coumadin per pharmacy.

## 2011-07-04 NOTE — Progress Notes (Signed)
ANTICOAGULATION CONSULT NOTE - Initial Consult  Pharmacy Consult:  Coumadin Indication:  Atrial fibrillation   Allergies  Allergen Reactions  . Ambien     Hallucinations  . Ciprofloxacin     unknown  . Quinolones     unknown  . Sulfonamide Derivatives     unknown    Patient Measurements: Height: 5\' 1"  (154.9 cm) Weight: 112 lb (50.803 kg) IBW/kg (Calculated) : 47.8   Vital Signs: Temp: 96.8 F (36 C) (02/07 1400) Temp src: Oral (02/07 1400) BP: 105/57 mmHg (02/07 1400) Pulse Rate: 67  (02/07 1936)  Labs:  Basename 07/04/11 1457 07/04/11 0939 07/04/11 0424 07/03/11 2328 07/03/11 2306  HGB -- -- -- 15.0 13.5  HCT -- -- -- 44.0 40.4  PLT -- -- -- -- 198  APTT -- -- -- -- --  LABPROT -- -- -- -- 30.9*  INR -- -- -- -- 2.91*  HEPARINUNFRC -- -- -- -- --  CREATININE -- -- -- 1.30* --  CKTOTAL 41 38 35 -- --  CKMB 2.5 2.4 2.2 -- --  TROPONINI <0.30 <0.30 <0.30 -- --   Estimated Creatinine Clearance: 23 ml/min (by C-G formula based on Cr of 1.3).  Medical History: Past Medical History  Diagnosis Date  . HTN (hypertension)   . A-fib   . Dyslipidemia   . Hypothyroidism   . Stroke   . Asthma   . Shortness of breath   . DEMENTIA   . CHF (congestive heart failure)   . GERD (gastroesophageal reflux disease)   . Anxiety   . COPD (chronic obstructive pulmonary disease)     Assessment: 43 YOF presented with CP, but without EKG changes.  Patient to resume on Coumadin for h/o AFib.  INR therapeutic at 2.91 yesterday.  Patient reports that her Coumadin dose was changed on Tuesday to 2mg  PO daily except 4mg  on Monday and Friday.  Her last dose was taken on Wednesday PTA.  INR supratherapeutic today at 3.33.   Goal of Therapy:  INR 2-3    Plan:  - No Coumadin tonight - Daily PT/INR    Phillips Climes, PharmD 07/04/2011,7:42 PM

## 2011-07-04 NOTE — Progress Notes (Signed)
UR Completed. Simmons, Lakisha Peyser F [phone]  

## 2011-07-04 NOTE — Progress Notes (Signed)
SUBJECTIVE:  No further chest pain.  No dizziness   PHYSICAL EXAM Filed Vitals:   07/04/11 0200 07/04/11 0215 07/04/11 0337 07/04/11 0420  BP: 134/75 130/40 121/68   Pulse: 64 65 65   Temp:   97.7 F (36.5 C)   TempSrc:   Oral   Resp: 21 24 20    Height:    5\' 1"  (1.549 m)  Weight:    50.803 kg (112 lb)  SpO2: 100% 100% 94%    General:  Frail Lungs:  Clear Heart:  RRR Abdomen:  Positive bowel sounds, no rebound no guarding Extremities:  No edema.  LABS: Lab Results  Component Value Date   CKTOTAL 35 07/04/2011   CKMB 2.2 07/04/2011   TROPONINI <0.30 07/04/2011   Results for orders placed during the hospital encounter of 07/03/11 (from the past 24 hour(s))  PRO B NATRIURETIC PEPTIDE     Status: Normal   Collection Time   07/03/11 11:03 PM      Component Value Range   Pro B Natriuretic peptide (BNP) 281.8  0 - 450 (pg/mL)  PROTIME-INR     Status: Abnormal   Collection Time   07/03/11 11:06 PM      Component Value Range   Prothrombin Time 30.9 (*) 11.6 - 15.2 (seconds)   INR 2.91 (*) 0.00 - 1.49   CBC     Status: Normal   Collection Time   07/03/11 11:06 PM      Component Value Range   WBC 5.4  4.0 - 10.5 (K/uL)   RBC 4.44  3.87 - 5.11 (MIL/uL)   Hemoglobin 13.5  12.0 - 15.0 (g/dL)   HCT 16.1  09.6 - 04.5 (%)   MCV 91.0  78.0 - 100.0 (fL)   MCH 30.4  26.0 - 34.0 (pg)   MCHC 33.4  30.0 - 36.0 (g/dL)   RDW 40.9  81.1 - 91.4 (%)   Platelets 198  150 - 400 (K/uL)  DIFFERENTIAL     Status: Normal   Collection Time   07/03/11 11:06 PM      Component Value Range   Neutrophils Relative 63  43 - 77 (%)   Neutro Abs 3.4  1.7 - 7.7 (K/uL)   Lymphocytes Relative 22  12 - 46 (%)   Lymphs Abs 1.2  0.7 - 4.0 (K/uL)   Monocytes Relative 11  3 - 12 (%)   Monocytes Absolute 0.6  0.1 - 1.0 (K/uL)   Eosinophils Relative 3  0 - 5 (%)   Eosinophils Absolute 0.2  0.0 - 0.7 (K/uL)   Basophils Relative 1  0 - 1 (%)   Basophils Absolute 0.0  0.0 - 0.1 (K/uL)  POCT I-STAT TROPONIN I      Status: Normal   Collection Time   07/03/11 11:26 PM      Component Value Range   Troponin i, poc 0.00  0.00 - 0.08 (ng/mL)   Comment 3           POCT I-STAT, CHEM 8     Status: Abnormal   Collection Time   07/03/11 11:28 PM      Component Value Range   Sodium 141  135 - 145 (mEq/L)   Potassium 4.3  3.5 - 5.1 (mEq/L)   Chloride 104  96 - 112 (mEq/L)   BUN 27 (*) 6 - 23 (mg/dL)   Creatinine, Ser 7.82 (*) 0.50 - 1.10 (mg/dL)   Glucose, Bld 956 (*) 70 - 99 (mg/dL)  Calcium, Ion 1.18  1.12 - 1.32 (mmol/L)   TCO2 28  0 - 100 (mmol/L)   Hemoglobin 15.0  12.0 - 15.0 (g/dL)   HCT 84.1  32.4 - 40.1 (%)  CARDIAC PANEL(CRET KIN+CKTOT+MB+TROPI)     Status: Normal   Collection Time   07/04/11  4:24 AM      Component Value Range   Total CK 35  7 - 177 (U/L)   CK, MB 2.2  0.3 - 4.0 (ng/mL)   Troponin I <0.30  <0.30 (ng/mL)   Relative Index RELATIVE INDEX IS INVALID  0.0 - 2.5    No intake or output data in the 24 hours ending 07/04/11 0825   ASSESSMENT AND PLAN:  1)  Chest pain:  No objective evidence of ischemia.  Doubt further work up since pain is gone unless there are enzyme changes or EKG changes  2)  Atrial fib: Maintaining NSR.  Continue current therapy.  3)  Orthostatic hypotension:  She had this in the office yesterday I stopped her Lasix and I will stop this in the hospital.   Rollene Rotunda 07/04/2011 8:25 AM

## 2011-07-04 NOTE — H&P (Signed)
Cardiology H&P  Primary Care Povider: Ezequiel Kayser, MD, MD Primary Cardiologist: Rollene Rotunda   HPI: Ms. Julie Rich is a 78 y.o.female with pAF and HTN who presents to the ED tonight after the onset of substernal chest pain at rest tonight at 8pm.  The pain came on suddenly while she was lying in bed.  She stated that she checked her BP and found her SBP to be 190.  She called EMS and came to the ED.  She did not take any nitro but the pain improved to 4/10 upon arrival to the ED.  There was concern that she was in afib on her initial EKG but by my review she has been in NSR.  She is currently having a mild chest pressure but her BP is normal and she has no EKG changes.  She was in the hospital in Jan with afib and underwent DCCV and increase in her propafenone dose. She has no prior h/o CAD.  In Jan her echo showed mild LVH, EF 50-55% with moderately dilated left atrium.     Past Medical History  Diagnosis Date  . HTN (hypertension)   . A-fib   . Dyslipidemia   . Hypothyroidism   . Stroke   . Asthma   . Shortness of breath   . DEMENTIA   . CHF (congestive heart failure)   . GERD (gastroesophageal reflux disease)   . Anxiety   . COPD (chronic obstructive pulmonary disease)     Past Surgical History  Procedure Date  . Total abdominal hysterectomy   . Breast surgery     45-50 YEARS AGO LUMP REMOVED  . Appendectomy   . Cardioversion 06/26/2011    Procedure: CARDIOVERSION;  Surgeon: Marca Ancona, MD;  Location: [facility] OR;  Service: Cardiovascular;  Laterality: N/A;    Family History  Problem Relation Age of Onset  . Coronary artery disease Neg Hx   . Stroke Brother   . Breast cancer Mother     Social History:  reports that she has never smoked. She does not have any smokeless tobacco history on file. She reports that she does not drink alcohol or use illicit drugs.  Allergies:  Allergies  Allergen Reactions  . Ciprofloxacin     unknown  . Quinolones     unknown  . Sulfonamide  Derivatives     unknown    Current Facility-Administered Medications  Medication Dose Route Frequency Provider Last Rate Last Dose  . aspirin chewable tablet 324 mg  324 mg Oral Once Cyndra Numbers, MD   324 mg at 07/03/11 2307  . nitroGLYCERIN (NITROSTAT) SL tablet 0.4 mg  0.4 mg Sublingual Q5 min PRN Cyndra Numbers, MD   0.4 mg at 07/04/11 0008   Current Outpatient Prescriptions  Medication Sig Dispense Refill  . albuterol (PROVENTIL,VENTOLIN) 90 MCG/ACT inhaler Inhale 2 puffs into the lungs every 6 (six) hours as needed. For shortness of breath      . ALPRAZolam (XANAX) 0.25 MG tablet Take 0.25 mg by mouth daily as needed. For anxiety      . amLODipine (NORVASC) 10 MG tablet Take 5 mg by mouth daily.      Marland Kitchen atorvastatin (LIPITOR) 20 MG tablet Take 10 mg by mouth every other day.      . Fluticasone-Salmeterol (ADVAIR DISKUS) 250-50 MCG/DOSE AEPB Inhale 1 puff into the lungs 2 (two) times daily.       . furosemide (LASIX) 40 MG tablet Take 40 mg by mouth daily.      Marland Kitchen  levothyroxine (SYNTHROID, LEVOTHROID) 25 MCG tablet Take 25 mcg by mouth daily.       Marland Kitchen losartan (COZAAR) 100 MG tablet Take 100 mg by mouth daily.       . metoprolol (TOPROL-XL) 100 MG 24 hr tablet Take 100 mg by mouth daily.       . polyethylene glycol (MIRALAX / GLYCOLAX) packet Take 17 g by mouth daily.      . potassium chloride SA (K-DUR,KLOR-CON) 20 MEQ tablet Take 10-20 mEq by mouth 2 (two) times daily. 10 meq in the morning and 20 meq in the evening      . propafenone (RYTHMOL) 225 MG tablet Take 1 tablet (225 mg total) by mouth 3 (three) times daily.  270 tablet  3  . senna-docusate (SENOKOT-S) 8.6-50 MG per tablet Take 3 tablets by mouth daily as needed. For constipation      . temazepam (RESTORIL) 15 MG capsule Take 15 mg by mouth at bedtime.       Marland Kitchen tiotropium (SPIRIVA) 18 MCG inhalation capsule Place 18 mcg into inhaler and inhale daily.       Marland Kitchen warfarin (COUMADIN) 4 MG tablet Take 2-4 mg by mouth daily. Take 2mg  on  Mon, Wed, and Fri.  Take 4mg  all other days.        ROS: A full review of systems is obtained and is negative except as noted in the HPI.  Physical Exam: Blood pressure 100/44, pulse 62, temperature 98.9 F (37.2 C), temperature source Oral, resp. rate 18, SpO2 99.00%.  GENERAL: no acute distress.  EYES: Extra ocular movements are intact. There is no lid lag. Sclera is anicteric.  ENT: Oropharynx is clear. Dentition is within normal limits.  NECK: Supple. The thyroid is not enlarged.  LYMPH: There are no masses or lymphadenopathy present.  HEART: Regular rate and rhythm with no m/g/r.  Normal S1/S2. No JVD LUNGS: Clear to auscultation There are no rales, rhonchi, or wheezes.  ABDOMEN: Soft, non-tender, and non-distended with normoactive bowel sounds. There is no hepatosplenomegaly.  EXTREMITIES: No clubbing, cyanosis, or edema.  PULSES: Carotids were +2 and equal bilaterally with no bruits. Femoral pulses were +2 and equal bilaterally. DP/PT pulses were +2 and equal bilaterally.  SKIN: Warm, dry, and intact.  NEUROLOGIC: The patient was oriented to person, place, and time. No overt neurologic deficits were detected.  PSYCH: Normal judgment and insight, mood is appropriate.   Results: Results for orders placed during the hospital encounter of 07/03/11 (from the past 24 hour(s))  PRO B NATRIURETIC PEPTIDE     Status: Normal   Collection Time   07/03/11 11:03 PM      Component Value Range   Pro B Natriuretic peptide (BNP) 281.8  0 - 450 (pg/mL)  PROTIME-INR     Status: Abnormal   Collection Time   07/03/11 11:06 PM      Component Value Range   Prothrombin Time 30.9 (*) 11.6 - 15.2 (seconds)   INR 2.91 (*) 0.00 - 1.49   CBC     Status: Normal   Collection Time   07/03/11 11:06 PM      Component Value Range   WBC 5.4  4.0 - 10.5 (K/uL)   RBC 4.44  3.87 - 5.11 (MIL/uL)   Hemoglobin 13.5  12.0 - 15.0 (g/dL)   HCT 16.1  09.6 - 04.5 (%)   MCV 91.0  78.0 - 100.0 (fL)   MCH 30.4  26.0 -  34.0 (pg)   MCHC  33.4  30.0 - 36.0 (g/dL)   RDW 57.8  46.9 - 62.9 (%)   Platelets 198  150 - 400 (K/uL)  DIFFERENTIAL     Status: Normal   Collection Time   07/03/11 11:06 PM      Component Value Range   Neutrophils Relative 63  43 - 77 (%)   Neutro Abs 3.4  1.7 - 7.7 (K/uL)   Lymphocytes Relative 22  12 - 46 (%)   Lymphs Abs 1.2  0.7 - 4.0 (K/uL)   Monocytes Relative 11  3 - 12 (%)   Monocytes Absolute 0.6  0.1 - 1.0 (K/uL)   Eosinophils Relative 3  0 - 5 (%)   Eosinophils Absolute 0.2  0.0 - 0.7 (K/uL)   Basophils Relative 1  0 - 1 (%)   Basophils Absolute 0.0  0.0 - 0.1 (K/uL)  POCT I-STAT TROPONIN I     Status: Normal   Collection Time   07/03/11 11:26 PM      Component Value Range   Troponin i, poc 0.00  0.00 - 0.08 (ng/mL)   Comment 3           POCT I-STAT, CHEM 8     Status: Abnormal   Collection Time   07/03/11 11:28 PM      Component Value Range   Sodium 141  135 - 145 (mEq/L)   Potassium 4.3  3.5 - 5.1 (mEq/L)   Chloride 104  96 - 112 (mEq/L)   BUN 27 (*) 6 - 23 (mg/dL)   Creatinine, Ser 5.28 (*) 0.50 - 1.10 (mg/dL)   Glucose, Bld 413 (*) 70 - 99 (mg/dL)   Calcium, Ion 2.44  0.10 - 1.32 (mmol/L)   TCO2 28  0 - 100 (mmol/L)   Hemoglobin 15.0  12.0 - 15.0 (g/dL)   HCT 27.2  53.6 - 64.4 (%)    EKG: NSR without STT changes, borderline qwaves anteriorly CXR: clear  Assessment/Plan: 78 yo WF with pAF, HTN, and HLD here with chest pain at rest and no EKG changes.  Will admit to tele bed for observation under Dr. Antoine Poche. 1. Chest Pain: no ekg changes and 1st trop is negative - ASA 325 today - no heparin as she is therapeutic on coumadin currently - follow cardiac markers - continue statin and toprol XL - risk stratification in AM, might consider stress test to r/o CAD as she is on propafenone for afib  2. PAF: currently in NSR - continue propafenone - already took coumadin tonight, will not order for now in case need for invasive testing  3. Diastolic CHF:  currently euvolemic and normotensive - contniue home meds  4. COPD: continue home meds  5. NPO after midnight for possible stress testing  Egan Berkheimer 07/04/2011, 1:46 AM

## 2011-07-04 NOTE — ED Notes (Addendum)
Card MD at [facility], no changes, alert, NAD, calm, interactive, VSS.

## 2011-07-05 DIAGNOSIS — R079 Chest pain, unspecified: Secondary | ICD-10-CM

## 2011-07-05 DIAGNOSIS — I951 Orthostatic hypotension: Secondary | ICD-10-CM

## 2011-07-05 LAB — BASIC METABOLIC PANEL
CO2: 26 mEq/L (ref 19–32)
Calcium: 9 mg/dL (ref 8.4–10.5)
Glucose, Bld: 84 mg/dL (ref 70–99)
Potassium: 4.6 mEq/L (ref 3.5–5.1)
Sodium: 140 mEq/L (ref 135–145)

## 2011-07-05 LAB — PROTIME-INR: Prothrombin Time: 28.9 seconds — ABNORMAL HIGH (ref 11.6–15.2)

## 2011-07-05 MED ORDER — WARFARIN SODIUM 2 MG PO TABS: 2.0000 mg | ORAL_TABLET | ORAL | Status: DC

## 2011-07-05 MED ORDER — WARFARIN SODIUM 4 MG PO TABS
4.0000 mg | ORAL_TABLET | ORAL | Status: DC
  Filled 2011-07-05: qty 1

## 2011-07-05 NOTE — Progress Notes (Signed)
ANTICOAGULATION CONSULT NOTE - Follow Up Consult  Pharmacy Consult for Coumadin Indication: atrial fibrillation  Allergies  Allergen Reactions  . Ambien     Hallucinations  . Ciprofloxacin     unknown  . Quinolones     unknown  . Sulfonamide Derivatives     unknown    Patient Measurements: Height: 5\' 1"  (154.9 cm) Weight: 112 lb (50.803 kg) IBW/kg (Calculated) : 47.8  Heparin Dosing Weight:    Vital Signs: Temp: 96.7 F (35.9 C) (02/08 0535) Temp src: Oral (02/08 0535) BP: 132/70 mmHg (02/08 0535) Pulse Rate: 69  (02/08 0535)  Labs:  Alvira Philips 07/05/11 0510 07/04/11 2002 07/04/11 1457 07/04/11 0939 07/04/11 0424 07/03/11 2328 07/03/11 2306  HGB -- -- -- -- -- 15.0 13.5  HCT -- -- -- -- -- 44.0 40.4  PLT -- -- -- -- -- -- 198  APTT -- -- -- -- -- -- --  LABPROT 28.9* 34.3* -- -- -- -- 30.9*  INR 2.67* 3.33* -- -- -- -- 2.91*  HEPARINUNFRC -- -- -- -- -- -- --  CREATININE 1.07 -- -- -- -- 1.30* --  CKTOTAL -- -- 41 38 35 -- --  CKMB -- -- 2.5 2.4 2.2 -- --  TROPONINI -- -- <0.30 <0.30 <0.30 -- --   Estimated Creatinine Clearance: 28 ml/min (by C-G formula based on Cr of 1.07).   Medications:  Prescriptions prior to admission  Medication Sig Dispense Refill  . albuterol (PROVENTIL,VENTOLIN) 90 MCG/ACT inhaler Inhale 2 puffs into the lungs every 6 (six) hours as needed. For shortness of breath      . ALPRAZolam (XANAX) 0.25 MG tablet Take 0.25 mg by mouth daily as needed. For anxiety      . amLODipine (NORVASC) 10 MG tablet Take 5 mg by mouth daily.      Marland Kitchen atorvastatin (LIPITOR) 20 MG tablet Take 10 mg by mouth every other day.      . Fluticasone-Salmeterol (ADVAIR DISKUS) 250-50 MCG/DOSE AEPB Inhale 1 puff into the lungs 2 (two) times daily.       . furosemide (LASIX) 40 MG tablet Take 40 mg by mouth daily.      Marland Kitchen levothyroxine (SYNTHROID, LEVOTHROID) 25 MCG tablet Take 25 mcg by mouth daily.       Marland Kitchen losartan (COZAAR) 100 MG tablet Take 100 mg by mouth daily.        . metoprolol (TOPROL-XL) 100 MG 24 hr tablet Take 100 mg by mouth daily.       . polyethylene glycol (MIRALAX / GLYCOLAX) packet Take 17 g by mouth daily.      . potassium chloride SA (K-DUR,KLOR-CON) 20 MEQ tablet Take 10-20 mEq by mouth 2 (two) times daily. 10 meq in the morning and 20 meq in the evening      . propafenone (RYTHMOL) 225 MG tablet Take 1 tablet (225 mg total) by mouth 3 (three) times daily.  270 tablet  3  . senna-docusate (SENOKOT-S) 8.6-50 MG per tablet Take 3 tablets by mouth daily as needed. For constipation      . temazepam (RESTORIL) 15 MG capsule Take 15 mg by mouth at bedtime.       Marland Kitchen tiotropium (SPIRIVA) 18 MCG inhalation capsule Place 18 mcg into inhaler and inhale daily.       Marland Kitchen warfarin (COUMADIN) 2 MG tablet Take 2-4 mg by mouth daily. 2mg  daily except 4mg  on Mon and Fri        Assessment:  89 YOF  presented with CP, but without EKG changes or evidence of ischemia. No further CP.  Likely discharge. Coumadin for h/o AFib. INR therapeutic at 2.91, 3.33, then 2.67 today. Patient reports that her Coumadin dose was changed on Tuesday to 2mg  PO daily except 4mg  on Monday and Friday.   Cards: Afib. HTN. Max bP 150/68 Meds: Norvasc 5mg , ASA 81mg , Losartan, Toprol, Propafenone, Crestor  Pulm: Advair, Spiriva  Renal: Scr down from 1.3 to 1.07   Goal of Therapy:  INR 2-3   Plan:  Resume home Coumadin regimen 2mg  PO daily except 4mg  on Monday and Friday.   Merilynn Finland, [employer] 07/05/2011,9:33 AM

## 2011-07-05 NOTE — Discharge Summary (Signed)
Discharge Summary   Patient ID: Julie Rich,  MRN: [id number], DOB/AGE: [birth year] 78 y.o.  Admit date: 07/03/2011 Discharge date: 07/05/2011  Discharge Diagnoses Principal Problem:  *Chest pain Active Problems:  Atrial fibrillation  Orthostatic hypotension  Allergies Allergies  Allergen Reactions  . Ambien     Hallucinations  . Ciprofloxacin     unknown  . Quinolones     unknown  . Sulfonamide Derivatives     unknown   Procedures  None  History of Present Illness  Julie Rich is a 78 yo female with PMHx significant for acute on chronic diastolic CHF, persistent atrial fibrillation, GERD, COPD, HTN and HL who was admitted to [facility] with complaints of substernal chest pain.   She reported the pain occurring suddenly while laying in bed. She then checked her BP which revealed a SBP of 190. She called EMS and was transported to the [facility]. The pain improved upon arrival to the ED. She did not take NTG. EKG was initially suspected to reveal a fib, but upon further review, NSR was determined. BP was WNL. Initial POC TnI was WNL. CXR revealed mild cardiology, unchanged from prior studies, otherwise without acute cardiopulmonary process. She was subsequently admitted for chest pain rule out.   Hospital Course   Her chest pain improved and cardiac biomarkers were negative x 3. No ischemic changes on EKG. Lasix and KCl was held for orthostatic hypotension diagnosed in a recent office visit. These were held during this admission. She maintained NSR throughout the hospitalization as well.   Today, she is stable, asymptomatic, at baseline and will be discharged home. Lasix and KCl will discontinued on discharge. She will follow-up with Dr. Antoine Poche as previously scheduled.   Discharge Vitals:  Blood pressure 132/70, pulse 69, temperature 96.7 F (35.9 C), temperature source Oral, resp. rate 16, height 5\' 1"  (1.549 m), weight 50.803 kg (112 lb), SpO2 96.00%.   Labs: Recent Labs  Basename  07/03/11 2328 07/03/11 2306   WBC -- 5.4   HGB 15.0 13.5   HCT 44.0 40.4   MCV -- 91.0   PLT -- 198    Lab 07/05/11 0510 07/03/11 2328  NA 140 141  K 4.6 4.3  CL 107 104  CO2 26 --  BUN 20 27*  CREATININE 1.07 1.30*  CALCIUM 9.0 --  PROT -- --  BILITOT -- --  ALKPHOS -- --  ALT -- --  AST -- --  AMYLASE -- --  LIPASE -- --  GLUCOSE 84 103*   Recent Labs  Basename 07/04/11 1457 07/04/11 0939 07/04/11 0424   CKTOTAL 41 38 35   CKMB 2.5 2.4 2.2   CKMBINDEX -- -- --   TROPONINI <0.30 <0.30 <0.30    Basename 07/04/11 0424  TSH 2.585  T4TOTAL --  T3FREE --  THYROIDAB --   Disposition:  Discharge Orders    Future Appointments: Provider: Department: Dept Phone: Center:   08/07/2011 11:45 AM Rollene Rotunda, MD [facility] [phone] [facility]     Follow-up Information    Follow up with Rollene Rotunda, MD on 07/10/2011. (11:45 AM)    Contact information:   [facility] [phone]         Discharge Medications:  Medication List  As of 07/05/2011 10:04 AM   CONTINUE taking these medications         ADVAIR DISKUS 250-50 MCG/DOSE Aepb   Generic drug: Fluticasone-Salmeterol      albuterol 90 MCG/ACT inhaler   Commonly known  as: PROVENTIL,VENTOLIN      ALPRAZolam 0.25 MG tablet   Commonly known as: XANAX      amLODipine 10 MG tablet   Commonly known as: NORVASC      atorvastatin 20 MG tablet   Commonly known as: LIPITOR      levothyroxine 25 MCG tablet   Commonly known as: SYNTHROID, LEVOTHROID      losartan 100 MG tablet   Commonly known as: COZAAR      metoprolol succinate 100 MG 24 hr tablet   Commonly known as: TOPROL-XL      polyethylene glycol packet   Commonly known as: MIRALAX / GLYCOLAX      propafenone 225 MG tablet   Commonly known as: RYTHMOL   Take 1 tablet (225 mg total) by mouth 3 (three) times daily.      RESTORIL 15 MG capsule   Generic drug: temazepam      senna-docusate 8.6-50 MG per tablet   Commonly known as:  Senokot-S      tiotropium 18 MCG inhalation capsule   Commonly known as: SPIRIVA      warfarin 2 MG tablet   Commonly known as: COUMADIN         STOP taking these medications         furosemide 40 MG tablet      potassium chloride SA 20 MEQ tablet           Outstanding Labs/Studies: None  Duration of Discharge Encounter: 40 minutes including physician time.  Signed, R. Hurman Horn, PA-C 07/05/2011, 10:04 AM  Patient seen and examined.  Plan as discussed in my rounding note for today and outlined above. Rollene Rotunda  07/05/2011  12:36 PM

## 2011-07-05 NOTE — Progress Notes (Signed)
   CARE MANAGEMENT NOTE 07/05/2011  Patient:  Julie Rich, Julie Rich   Account Number:  [id number]  Date Initiated:  07/05/2011  Documentation initiated by:  GRAVES-BIGELOW,Samon Dishner  Subjective/Objective Assessment:   Pt admitted with cp. Plan for home with resumption of HH services.     Action/Plan:   CM received resumption orders for services and called [facility]. SOC to begin within 24-48 hourspost d/c.   Anticipated DC Date:  07/05/2011   Anticipated DC Plan:  HOME W HOME HEALTH SERVICES      DC Planning Services  CM consult      [facility] Choice  Resumption Of Svcs/PTA Provider   Choice offered to / List presented to:  C-1 Patient        HH arranged  HH-10 DISEASE MANAGEMENT  HH-1 RN  HH-2 PT  HH-4 NURSE'S AIDE      HH agency  [facility]   Status of service:  Completed, signed off Medicare Important Message given?   (If response is "NO", the following Medicare IM given date fields will be blank) Date Medicare IM given:   Date Additional Medicare IM given:    Discharge Disposition:  HOME W HOME HEALTH SERVICES  Per UR Regulation:    Comments:

## 2011-07-05 NOTE — Progress Notes (Signed)
SUBJECTIVE:  No chest pain.  No SOB   PHYSICAL EXAM Filed Vitals:   07/04/11 1400 07/04/11 1936 07/04/11 2202 07/05/11 0535  BP: 105/57  150/68 132/70  Pulse: 63 67 67 69  Temp: 96.8 F (36 C)  97.2 F (36.2 C) 96.7 F (35.9 C)  TempSrc: Oral  Oral Oral  Resp: 20 19 20 16   Height:      Weight:      SpO2: 94% 96% 93% 92%   General:  No distress Lungs:  Clear Heart:  Regular Abdomen:  Positive bowel sounds, no rebound no guarding Extremities:  No edema  LABS: Lab Results  Component Value Date   CKTOTAL 41 07/04/2011   CKMB 2.5 07/04/2011   TROPONINI <0.30 07/04/2011   Results for orders placed during the hospital encounter of 07/03/11 (from the past 24 hour(s))  CARDIAC PANEL(CRET KIN+CKTOT+MB+TROPI)     Status: Normal   Collection Time   07/04/11  9:39 AM      Component Value Range   Total CK 38  7 - 177 (U/L)   CK, MB 2.4  0.3 - 4.0 (ng/mL)   Troponin I <0.30  <0.30 (ng/mL)   Relative Index RELATIVE INDEX IS INVALID  0.0 - 2.5   CARDIAC PANEL(CRET KIN+CKTOT+MB+TROPI)     Status: Normal   Collection Time   07/04/11  2:57 PM      Component Value Range   Total CK 41  7 - 177 (U/L)   CK, MB 2.5  0.3 - 4.0 (ng/mL)   Troponin I <0.30  <0.30 (ng/mL)   Relative Index RELATIVE INDEX IS INVALID  0.0 - 2.5   PROTIME-INR     Status: Abnormal   Collection Time   07/04/11  8:02 PM      Component Value Range   Prothrombin Time 34.3 (*) 11.6 - 15.2 (seconds)   INR 3.33 (*) 0.00 - 1.49   BASIC METABOLIC PANEL     Status: Abnormal   Collection Time   07/05/11  5:10 AM      Component Value Range   Sodium 140  135 - 145 (mEq/L)   Potassium 4.6  3.5 - 5.1 (mEq/L)   Chloride 107  96 - 112 (mEq/L)   CO2 26  19 - 32 (mEq/L)   Glucose, Bld 84  70 - 99 (mg/dL)   BUN 20  6 - 23 (mg/dL)   Creatinine, Ser 9.52  0.50 - 1.10 (mg/dL)   Calcium 9.0  8.4 - 84.1 (mg/dL)   GFR calc non Af Amer 45 (*) >90 (mL/min)   GFR calc Af Amer 53 (*) >90 (mL/min)  PROTIME-INR     Status: Abnormal   Collection Time   07/05/11  5:10 AM      Component Value Range   Prothrombin Time 28.9 (*) 11.6 - 15.2 (seconds)   INR 2.67 (*) 0.00 - 1.49     Intake/Output Summary (Last 24 hours) at 07/05/11 0900 Last data filed at 07/04/11 1700  Gross per 24 hour  Intake    107 ml  Output      0 ml  Net    107 ml    ASSESSMENT AND PLAN:  1) Chest pain: No objective evidence of ischemia. No further pain.  No further work up.  OK to discharge.   2) Atrial fib: Maintaining NSR. Continue current therapy.   3) Orthostatic hypotension: She had this in the office yesterday I stopped her Lasix also stop the potassium  on discharge.  Continue other meds as listed.  Keep scheduled appt with me.  Fayrene Fearing Devere Brem 07/05/2011 9:00 AM

## 2011-07-10 ENCOUNTER — Telehealth: Payer: Self-pay | Admitting: Cardiology

## 2011-07-10 LAB — MISCELLANEOUS TEST

## 2011-07-10 NOTE — Telephone Encounter (Signed)
Left message for Julie Rich - OK for pt to take Norvasc 10 mg a day per Dr Antoine Poche

## 2011-07-10 NOTE — Telephone Encounter (Signed)
New msg Pt's daughter called. She wants to discuss her meds. She wants her to go back to 10 mg of norvasc. Please call

## 2011-07-23 ENCOUNTER — Other Ambulatory Visit: Payer: Self-pay | Admitting: *Deleted

## 2011-07-23 MED ORDER — PROPAFENONE HCL 150 MG PO TABS: 150.0000 mg | ORAL_TABLET | Freq: Three times a day (TID) | ORAL | Status: DC

## 2011-08-07 ENCOUNTER — Encounter: Payer: Self-pay | Admitting: Cardiology

## 2011-08-07 ENCOUNTER — Ambulatory Visit (INDEPENDENT_AMBULATORY_CARE_PROVIDER_SITE_OTHER): Payer: Medicare Other | Admitting: Cardiology

## 2011-08-07 VITALS — BP 110/65 | HR 64 | Ht 61.0 in | Wt 114.0 lb

## 2011-08-07 DIAGNOSIS — I4891 Unspecified atrial fibrillation: Secondary | ICD-10-CM

## 2011-08-07 DIAGNOSIS — R079 Chest pain, unspecified: Secondary | ICD-10-CM

## 2011-08-07 DIAGNOSIS — I10 Essential (primary) hypertension: Secondary | ICD-10-CM

## 2011-08-07 NOTE — Progress Notes (Signed)
HPI The patient presents for followup of atrial fibrillation. Since I last saw her she has had no further tachycardia palpitations. She still feels fatigued. She has had no presyncope or syncope. She still gets short of breath with mild activity. She did have an elevated or patent on level which was on the higher dose. I have since reduced the sac to her previous dose. She's not had any chest pressure, neck or arm discomfort. She's not had any weight gain or edema.  Allergies  Allergen Reactions  . Ambien     Hallucinations  . Ciprofloxacin     unknown  . Quinolones     unknown  . Sulfonamide Derivatives     unknown    Current Outpatient Prescriptions  Medication Sig Dispense Refill  . albuterol (PROVENTIL,VENTOLIN) 90 MCG/ACT inhaler Inhale 2 puffs into the lungs every 6 (six) hours as needed. For shortness of breath      . ALPRAZolam (XANAX) 0.25 MG tablet Take 0.25 mg by mouth daily as needed. For anxiety      . amLODipine (NORVASC) 10 MG tablet Take 5 mg by mouth daily.      Marland Kitchen atorvastatin (LIPITOR) 20 MG tablet Take 10 mg by mouth every other day.      . Fluticasone-Salmeterol (ADVAIR DISKUS) 250-50 MCG/DOSE AEPB Inhale 1 puff into the lungs 2 (two) times daily.       Marland Kitchen levothyroxine (SYNTHROID, LEVOTHROID) 25 MCG tablet Take 25 mcg by mouth daily.       Marland Kitchen losartan (COZAAR) 100 MG tablet Take 100 mg by mouth daily.       . metoprolol (TOPROL-XL) 100 MG 24 hr tablet Take 100 mg by mouth daily.       . polyethylene glycol (MIRALAX / GLYCOLAX) packet Take 17 g by mouth daily.      . propafenone (RYTHMOL) 150 MG tablet Take 1 tablet (150 mg total) by mouth 3 (three) times daily.  270 tablet  3  . senna-docusate (SENOKOT-S) 8.6-50 MG per tablet Take 3 tablets by mouth daily as needed. For constipation      . temazepam (RESTORIL) 15 MG capsule Take 15 mg by mouth at bedtime.       Marland Kitchen tiotropium (SPIRIVA) 18 MCG inhalation capsule Place 18 mcg into inhaler and inhale daily.       Marland Kitchen  warfarin (COUMADIN) 2 MG tablet Take 2-4 mg by mouth daily. 2mg  daily except 4mg  on Mon and Fri        Past Medical History  Diagnosis Date  . HTN (hypertension)   . A-fib   . Dyslipidemia   . Hypothyroidism   . Stroke   . Asthma   . Shortness of breath   . DEMENTIA   . CHF (congestive heart failure)   . GERD (gastroesophageal reflux disease)   . Anxiety   . COPD (chronic obstructive pulmonary disease)     Past Surgical History  Procedure Date  . Total abdominal hysterectomy   . Breast surgery     45-50 YEARS AGO LUMP REMOVED  . Appendectomy   . Cardioversion 06/26/2011    Procedure: CARDIOVERSION;  Surgeon: Marca Ancona, MD;  Location: [facility] OR;  Service: Cardiovascular;  Laterality: N/A;    ROS:  As stated in the HPI and negative for all other systems.  PHYSICAL EXAM BP 110/65  Pulse 64  Ht 5\' 1"  (1.549 m)  Wt 114 lb (51.71 kg)  BMI 21.54 kg/m2   (note:  We observed  her on the monitor and she was in sinus) GENERAL:  Frail appearing HEENT:  Pupils equal round and reactive, fundi not visualized, oral mucosa unremarkable NECK:  No jugular venous distention, waveform within normal limits, carotid upstroke brisk and symmetric, no bruits, no thyromegaly LYMPHATICS:  No cervical, inguinal adenopathy LUNGS:  Clear to auscultation bilaterally BACK:  No CVA tenderness CHEST:  Unremarkable HEART:  PMI not displaced or sustained,S1 and S2 within normal limits, no S3, no S4, no clicks, no rubs, no murmurs ABD:  Flat, positive bowel sounds normal in frequency in pitch, no bruits, no rebound, no guarding, no midline pulsatile mass, no hepatomegaly, no splenomegaly EXT:  2 plus pulses throughout, no edema, no cyanosis no clubbing SKIN:  No rashes no nodules  ASSESSMENT AND PLAN

## 2011-08-07 NOTE — Patient Instructions (Signed)
The current medical regimen is effective;  continue present plan and medications.  Follow up in 4 month with Dr Antoine Poche in [place]

## 2011-08-07 NOTE — Assessment & Plan Note (Signed)
She has had no recurrence of this. No change in therapy is indicated.

## 2011-08-07 NOTE — Assessment & Plan Note (Signed)
The patient has had no recurrence of this. No change in therapy is indicated. She will continue the meds as listed. Of note that she's not significantly improved symptomatically in sinus rhythm indicates that atrial fibrillation is not because of her weakness or dyspnea.

## 2011-08-07 NOTE — Assessment & Plan Note (Signed)
Her blood pressure is controlled and she will continue the meds as listed 

## 2012-01-22 ENCOUNTER — Ambulatory Visit (INDEPENDENT_AMBULATORY_CARE_PROVIDER_SITE_OTHER): Payer: Medicare Other | Admitting: Cardiology

## 2012-01-22 ENCOUNTER — Encounter: Payer: Self-pay | Admitting: Cardiology

## 2012-01-22 VITALS — BP 90/60 | HR 59 | Ht 61.0 in | Wt 114.0 lb

## 2012-01-22 DIAGNOSIS — I10 Essential (primary) hypertension: Secondary | ICD-10-CM

## 2012-01-22 DIAGNOSIS — I5033 Acute on chronic diastolic (congestive) heart failure: Secondary | ICD-10-CM

## 2012-01-22 DIAGNOSIS — I4891 Unspecified atrial fibrillation: Secondary | ICD-10-CM

## 2012-01-22 DIAGNOSIS — I951 Orthostatic hypotension: Secondary | ICD-10-CM

## 2012-01-22 DIAGNOSIS — I509 Heart failure, unspecified: Secondary | ICD-10-CM

## 2012-01-22 DIAGNOSIS — E785 Hyperlipidemia, unspecified: Secondary | ICD-10-CM

## 2012-01-22 NOTE — Patient Instructions (Addendum)
The current medical regimen is effective;  continue present plan and medications.  Follow up in 1 year with Dr Hochrein.  You will receive a letter in the mail 2 months before you are due.  Please call us when you receive this letter to schedule your follow up appointment.  

## 2012-01-22 NOTE — Progress Notes (Signed)
HPI The patient presents for followup of atrial fibrillation. Since I last saw her she has had no further tachycardia palpitations. She still feels fatigued. She has had no presyncope or syncope however.  She does have a low BP today.  However, this is unusual for her as she says it's sometimes as high as 140 typically well controlled. She denies any new shortness of breath though she gets dyspneic chronically with moderate exertion. She's not describing PND or orthopnea.  Allergies  Allergen Reactions  . Ciprofloxacin     unknown  . Quinolones     unknown  . Sulfonamide Derivatives     unknown  . Zolpidem Tartrate     Hallucinations    Current Outpatient Prescriptions  Medication Sig Dispense Refill  . albuterol (PROVENTIL,VENTOLIN) 90 MCG/ACT inhaler Inhale 2 puffs into the lungs every 6 (six) hours as needed. For shortness of breath      . ALPRAZolam (XANAX) 0.25 MG tablet Take 0.25 mg by mouth daily as needed. For anxiety      . amLODipine (NORVASC) 10 MG tablet Take 5 mg by mouth daily.      Marland Kitchen atorvastatin (LIPITOR) 20 MG tablet Take 10 mg by mouth every other day.      . Fluticasone-Salmeterol (ADVAIR DISKUS) 250-50 MCG/DOSE AEPB Inhale 1 puff into the lungs 2 (two) times daily.       Marland Kitchen levothyroxine (SYNTHROID, LEVOTHROID) 25 MCG tablet Take 25 mcg by mouth daily.       Marland Kitchen losartan (COZAAR) 100 MG tablet Take 100 mg by mouth daily.       . metoprolol (TOPROL-XL) 100 MG 24 hr tablet Take 100 mg by mouth daily.       . polyethylene glycol (MIRALAX / GLYCOLAX) packet Take 17 g by mouth daily.      . propafenone (RYTHMOL) 150 MG tablet Take 1 tablet (150 mg total) by mouth 3 (three) times daily.  270 tablet  3  . senna-docusate (SENOKOT-S) 8.6-50 MG per tablet Take 3 tablets by mouth daily as needed. For constipation      . temazepam (RESTORIL) 15 MG capsule Take 15 mg by mouth at bedtime.       Marland Kitchen tiotropium (SPIRIVA) 18 MCG inhalation capsule Place 18 mcg into inhaler and inhale  daily.       Marland Kitchen warfarin (COUMADIN) 2 MG tablet Take 2-4 mg by mouth daily. 2mg  daily except 4mg  on Mon and Fri        Past Medical History  Diagnosis Date  . HTN (hypertension)   . A-fib   . Dyslipidemia   . Hypothyroidism   . Stroke   . Asthma   . Shortness of breath   . DEMENTIA   . CHF (congestive heart failure)   . GERD (gastroesophageal reflux disease)   . Anxiety   . COPD (chronic obstructive pulmonary disease)     Past Surgical History  Procedure Date  . Total abdominal hysterectomy   . Breast surgery     45-50 YEARS AGO LUMP REMOVED  . Appendectomy   . Cardioversion 06/26/2011    Procedure: CARDIOVERSION;  Surgeon: Marca Ancona, MD;  Location: [facility] OR;  Service: Cardiovascular;  Laterality: N/A;    ROS:  As stated in the HPI and negative for all other systems.  PHYSICAL EXAM BP 90/60  Pulse 59  Ht 5\' 1"  (1.549 m)  Wt 114 lb (51.71 kg)  BMI 21.54 kg/m2   GENERAL:  Frail appearing HEENT:  Pupils equal round and reactive, fundi not visualized, oral mucosa unremarkable NECK:  No jugular venous distention, waveform within normal limits, carotid upstroke brisk and symmetric, no bruits, no thyromegaly LYMPHATICS:  No cervical, inguinal adenopathy LUNGS:  Clear to auscultation bilaterally BACK:  No CVA tenderness CHEST:  Unremarkable HEART:  PMI not displaced or sustained,S1 and S2 within normal limits, no S3, no S4, no clicks, no rubs, no murmurs ABD:  Flat, positive bowel sounds normal in frequency in pitch, no bruits, no rebound, no guarding, no midline pulsatile mass, no hepatomegaly, no splenomegaly EXT:  2 plus pulses throughout, no edema, no cyanosis no clubbing   EKG:  Sinus rhythm, rate 59, left axis deviation, voltage criteria for left ventricular hypertrophy with mild interventricular conduction delay, first degree AV block, RSR prime V1 and V2, no acute ST-T wave changes.  01/22/2012   ASSESSMENT AND PLAN   Atrial fibrillation -  The patient has had  no recurrence of this. No change in therapy is indicated. She will continue the meds as listed. She tolerates anticoagulation.  Chest pain -  She has had no recurrence of this. No change in therapy is indicated.  HYPERTENSION -  Her daughter will keep a watch on her blood pressure and it is trending low we might be able to back off on some of her medications.

## 2012-02-01 ENCOUNTER — Encounter (HOSPITAL_COMMUNITY): Payer: Self-pay | Admitting: *Deleted

## 2012-02-01 ENCOUNTER — Emergency Department (HOSPITAL_COMMUNITY)
Admission: EM | Admit: 2012-02-01 | Discharge: 2012-02-01 | Disposition: A | Discharge status: Home / Self Care | Payer: Medicare Other | Attending: Emergency Medicine | Admitting: Emergency Medicine

## 2012-02-01 ENCOUNTER — Emergency Department (HOSPITAL_COMMUNITY): Payer: Medicare Other

## 2012-02-01 DIAGNOSIS — Z7901 Long term (current) use of anticoagulants: Secondary | ICD-10-CM | POA: Insufficient documentation

## 2012-02-01 DIAGNOSIS — Z8673 Personal history of transient ischemic attack (TIA), and cerebral infarction without residual deficits: Secondary | ICD-10-CM | POA: Insufficient documentation

## 2012-02-01 DIAGNOSIS — F0390 Unspecified dementia without behavioral disturbance: Secondary | ICD-10-CM | POA: Insufficient documentation

## 2012-02-01 DIAGNOSIS — E785 Hyperlipidemia, unspecified: Secondary | ICD-10-CM | POA: Insufficient documentation

## 2012-02-01 DIAGNOSIS — Z882 Allergy status to sulfonamides status: Secondary | ICD-10-CM | POA: Insufficient documentation

## 2012-02-01 DIAGNOSIS — K219 Gastro-esophageal reflux disease without esophagitis: Secondary | ICD-10-CM | POA: Insufficient documentation

## 2012-02-01 DIAGNOSIS — I4891 Unspecified atrial fibrillation: Secondary | ICD-10-CM | POA: Insufficient documentation

## 2012-02-01 DIAGNOSIS — M5137 Other intervertebral disc degeneration, lumbosacral region: Secondary | ICD-10-CM | POA: Insufficient documentation

## 2012-02-01 DIAGNOSIS — J4489 Other specified chronic obstructive pulmonary disease: Secondary | ICD-10-CM | POA: Insufficient documentation

## 2012-02-01 DIAGNOSIS — F411 Generalized anxiety disorder: Secondary | ICD-10-CM | POA: Insufficient documentation

## 2012-02-01 DIAGNOSIS — I10 Essential (primary) hypertension: Secondary | ICD-10-CM | POA: Insufficient documentation

## 2012-02-01 DIAGNOSIS — Z79899 Other long term (current) drug therapy: Secondary | ICD-10-CM | POA: Insufficient documentation

## 2012-02-01 DIAGNOSIS — J449 Chronic obstructive pulmonary disease, unspecified: Secondary | ICD-10-CM | POA: Insufficient documentation

## 2012-02-01 DIAGNOSIS — M51379 Other intervertebral disc degeneration, lumbosacral region without mention of lumbar back pain or lower extremity pain: Secondary | ICD-10-CM | POA: Insufficient documentation

## 2012-02-01 DIAGNOSIS — M549 Dorsalgia, unspecified: Secondary | ICD-10-CM

## 2012-02-01 DIAGNOSIS — M47817 Spondylosis without myelopathy or radiculopathy, lumbosacral region: Secondary | ICD-10-CM | POA: Insufficient documentation

## 2012-02-01 DIAGNOSIS — N390 Urinary tract infection, site not specified: Secondary | ICD-10-CM

## 2012-02-01 DIAGNOSIS — E039 Hypothyroidism, unspecified: Secondary | ICD-10-CM | POA: Insufficient documentation

## 2012-02-01 DIAGNOSIS — I509 Heart failure, unspecified: Secondary | ICD-10-CM | POA: Insufficient documentation

## 2012-02-01 LAB — URINALYSIS, ROUTINE W REFLEX MICROSCOPIC
Bilirubin Urine: NEGATIVE
Glucose, UA: NEGATIVE mg/dL
Hgb urine dipstick: NEGATIVE
Ketones, ur: NEGATIVE mg/dL
Protein, ur: NEGATIVE mg/dL

## 2012-02-01 MED ORDER — AMOXICILLIN-POT CLAVULANATE 875-125 MG PO TABS: 1.0000 | ORAL_TABLET | Freq: Two times a day (BID) | ORAL | Status: AC

## 2012-02-01 MED ORDER — OXYCODONE-ACETAMINOPHEN 5-325 MG PO TABS: 1.0000 | ORAL_TABLET | Freq: Four times a day (QID) | ORAL | Status: DC | PRN

## 2012-02-01 MED ORDER — IBUPROFEN 600 MG PO TABS: 600.0000 mg | ORAL_TABLET | Freq: Four times a day (QID) | ORAL | Status: DC | PRN

## 2012-02-01 MED ORDER — AMOXICILLIN-POT CLAVULANATE 875-125 MG PO TABS
1.0000 | ORAL_TABLET | Freq: Once | ORAL | Status: AC
  Administered 2012-02-01: 1 via ORAL
  Filled 2012-02-01: qty 1

## 2012-02-01 MED ORDER — IBUPROFEN 200 MG PO TABS
600.0000 mg | ORAL_TABLET | Freq: Once | ORAL | Status: AC
  Administered 2012-02-01: 600 mg via ORAL
  Filled 2012-02-01: qty 3
  Filled 2012-02-01: qty 1

## 2012-02-01 MED ORDER — OXYCODONE-ACETAMINOPHEN 5-325 MG PO TABS
1.0000 | ORAL_TABLET | Freq: Once | ORAL | Status: AC
  Administered 2012-02-01: 1 via ORAL
  Filled 2012-02-01: qty 1

## 2012-02-01 NOTE — ED Notes (Signed)
Pt talking with her daughter on the phone

## 2012-02-01 NOTE — ED Notes (Signed)
Went to collect urine. Pt. Off the floor

## 2012-02-01 NOTE — ED Notes (Signed)
Spoke to pt's daughter. 

## 2012-02-01 NOTE — ED Notes (Signed)
Per EMS, pt from home, reports low back pain, no injury.  Pt reports seeing a chiropractor Wednesday this week.  Low back pain became severe this am about 2 am.  Pt unable to ambulate d/t severe pain.

## 2012-02-01 NOTE — ED Notes (Signed)
Pt's daughter came to pick pt up.  She also spoke to Dr. Silverio Lay re her concerns re pt's care.

## 2012-02-01 NOTE — ED Notes (Signed)
Pt reports no pain as long as she's not moving.  Pt is ambulating with Lillia Abed NT to assist.  Pt reports mild pain.

## 2012-02-01 NOTE — ED Provider Notes (Signed)
History     CSN: [id number]  Arrival date & time 02/01/12  0940   First MD Initiated Contact with Patient 02/01/12 [phone]      Chief Complaint  Patient presents with  . Back Pain    (Consider location/radiation/quality/duration/timing/severity/associated sxs/prior treatment) The history is provided by the patient.  Julie Rich is a 78 y.o. female hx of HTN, afib on coumadin, hypothyroidism here with low back pain. Back pain x several years. She has been going to her chiropractor for this problem and hasn't been taking any meds. When to chiropractor 3 days ago and since yesterday, unable to walk from the pain. No trauma or fall or injury. No urinary symptoms or numbness or fevers. She hasn't have any workup for this problem previously.    Past Medical History  Diagnosis Date  . HTN (hypertension)   . A-fib   . Dyslipidemia   . Hypothyroidism   . Stroke   . Asthma   . Shortness of breath   . DEMENTIA   . CHF (congestive heart failure)   . GERD (gastroesophageal reflux disease)   . Anxiety   . COPD (chronic obstructive pulmonary disease)   . STROKE 04/14/2007    Past Surgical History  Procedure Date  . Total abdominal hysterectomy   . Breast surgery     45-50 YEARS AGO LUMP REMOVED  . Appendectomy   . Cardioversion 06/26/2011    Procedure: CARDIOVERSION;  Surgeon: Marca Ancona, MD;  Location: [facility] OR;  Service: Cardiovascular;  Laterality: N/A;    Family History  Problem Relation Age of Onset  . Coronary artery disease Neg Hx   . Stroke Brother   . Breast cancer Mother     History  Substance Use Topics  . Smoking status: Never Smoker   . Smokeless tobacco: Not on file   Comment: does not smoke   . Alcohol Use: No    OB History    Grav Para Term Preterm Abortions TAB SAB Ect Mult Living                  Review of Systems  Musculoskeletal: Positive for back pain and gait problem.  All other systems reviewed and are negative.    Allergies    Ciprofloxacin; Quinolones; Sulfonamide derivatives; and Zolpidem tartrate  Home Medications   Current Outpatient Rx  Name Route Sig Dispense Refill  . ALPRAZOLAM 0.25 MG PO TABS Oral Take 0.25 mg by mouth daily as needed. For anxiety    . AMLODIPINE BESYLATE 10 MG PO TABS Oral Take 10 mg by mouth daily.     . ATORVASTATIN CALCIUM 20 MG PO TABS Oral Take 10 mg by mouth every other day.    Marland Kitchen FLUTICASONE-SALMETEROL 250-50 MCG/DOSE IN AEPB Inhalation Inhale 1 puff into the lungs 2 (two) times daily.     Marland Kitchen LEVOTHYROXINE SODIUM 25 MCG PO TABS Oral Take 25 mcg by mouth daily.     Marland Kitchen LOSARTAN POTASSIUM 100 MG PO TABS Oral Take 100 mg by mouth daily.     Marland Kitchen METOPROLOL SUCCINATE ER 100 MG PO TB24 Oral Take 100 mg by mouth daily.     Marland Kitchen POLYETHYLENE GLYCOL 3350 PO PACK Oral Take 17 g by mouth daily as needed. For constipation    . PROPAFENONE HCL 150 MG PO TABS Oral Take 150 mg by mouth every 8 (eight) hours.    Bernadette Hoit SODIUM 8.6-50 MG PO TABS Oral Take 1 tablet by mouth daily as needed.  For constipation    . TEMAZEPAM 15 MG PO CAPS Oral Take 15 mg by mouth at bedtime.     Marland Kitchen TIOTROPIUM BROMIDE MONOHYDRATE 18 MCG IN CAPS Inhalation Place 18 mcg into inhaler and inhale daily.     . WARFARIN SODIUM 2 MG PO TABS Oral Take 2-4 mg by mouth daily. 2mg  on Monday, Wednesday, Friday. 4mg  on Tuesday, Thursday, Saturday and Sunday      BP 129/52  Pulse 59  Temp 98.1 F (36.7 C) (Oral)  Resp 20  SpO2 95%  Physical Exam  Nursing note and vitals reviewed. Constitutional: She is oriented to person, place, and time. She appears well-developed and well-nourished.       Comfortable  HENT:  Head: Normocephalic.  Mouth/Throat: Oropharynx is clear and moist.  Eyes: Conjunctivae are normal. Pupils are equal, round, and reactive to light.  Neck: Normal range of motion. Neck supple.  Cardiovascular: Normal rate, regular rhythm and normal heart sounds.   Pulmonary/Chest: Effort normal and breath  sounds normal.  Abdominal: Soft. Bowel sounds are normal.  Musculoskeletal:       Mild tenderness upper lumbar area. Neg straight leg raise. NL neurovascular exam on lower extremities.   Neurological: She is alert and oriented to person, place, and time.  Skin: Skin is warm and dry.  Psychiatric: She has a normal mood and affect. Her behavior is normal. Judgment and thought content normal.    ED Course  Procedures (including critical care time)  Labs Reviewed  URINALYSIS, ROUTINE W REFLEX MICROSCOPIC - Abnormal; Notable for the following:    APPearance CLOUDY (*)     Leukocytes, UA SMALL (*)     All other components within normal limits  URINE MICROSCOPIC-ADD ON - Abnormal; Notable for the following:    Bacteria, UA MANY (*)     All other components within normal limits   Dg Lumbar Spine Complete  02/01/2012  *RADIOLOGY REPORT*  Clinical Data: Low back pain.  LUMBAR SPINE - COMPLETE 4+ VIEW  Comparison: No priors.  Findings: Multiple views of the lumbar spine demonstrate no definite acute displaced fractures or compression type fractures. There is multilevel degenerative disc disease, most severe at L1- L2.  Multilevel facet arthropathy is also noted, most severe at L4- L5 and L5-S1.  Atherosclerosis of the visualized vasculature. Surgical sutures are seen projecting over the anatomic pelvis.  IMPRESSION: 1.  No acute radiographic abnormality of the lumbar spine. 2.  Multilevel degenerative disc disease and lumbar spondylosis, as above.   Original Report Authenticated By: Florencia Reasons, M.D.      1. UTI (urinary tract infection)   2. Back pain       MDM  Julie Rich is a 78 y.o. female here with worsening low back pain. Will check xray lumbar to r/o fracture and provide pain control. She said she often gets UTIs with this so will check UA.   12:21 PM Patient feels well. Able to ambulate with no assistance. Will d/c home on motrin and percocet. Also given augmentin for UTI  (she is allergic to cipro and bactrim). Return instructions given.        Richardean Canal, MD 02/01/12 [phone]

## 2012-02-01 NOTE — ED Notes (Signed)
ZOX:[id number]<AV> Expected date:02/01/12<BR> Expected time: 9:34 AM<BR> Means of arrival:Ambulance<BR> Comments:<BR> Back pain/unable to walk

## 2012-02-10 ENCOUNTER — Emergency Department (HOSPITAL_COMMUNITY)
Admission: EM | Admit: 2012-02-10 | Discharge: 2012-02-10 | Disposition: A | Discharge status: Home / Self Care | Payer: Medicare Other | Attending: Emergency Medicine | Admitting: Emergency Medicine

## 2012-02-10 ENCOUNTER — Telehealth: Payer: Self-pay | Admitting: Cardiology

## 2012-02-10 ENCOUNTER — Encounter (HOSPITAL_COMMUNITY): Payer: Self-pay

## 2012-02-10 ENCOUNTER — Emergency Department (HOSPITAL_COMMUNITY): Payer: Medicare Other

## 2012-02-10 DIAGNOSIS — E039 Hypothyroidism, unspecified: Secondary | ICD-10-CM | POA: Insufficient documentation

## 2012-02-10 DIAGNOSIS — I4891 Unspecified atrial fibrillation: Secondary | ICD-10-CM | POA: Insufficient documentation

## 2012-02-10 DIAGNOSIS — Z882 Allergy status to sulfonamides status: Secondary | ICD-10-CM | POA: Insufficient documentation

## 2012-02-10 DIAGNOSIS — F0390 Unspecified dementia without behavioral disturbance: Secondary | ICD-10-CM | POA: Insufficient documentation

## 2012-02-10 DIAGNOSIS — I10 Essential (primary) hypertension: Secondary | ICD-10-CM | POA: Insufficient documentation

## 2012-02-10 DIAGNOSIS — K219 Gastro-esophageal reflux disease without esophagitis: Secondary | ICD-10-CM | POA: Insufficient documentation

## 2012-02-10 DIAGNOSIS — Z8673 Personal history of transient ischemic attack (TIA), and cerebral infarction without residual deficits: Secondary | ICD-10-CM | POA: Insufficient documentation

## 2012-02-10 DIAGNOSIS — R531 Weakness: Secondary | ICD-10-CM

## 2012-02-10 DIAGNOSIS — I4460 Unspecified fascicular block: Secondary | ICD-10-CM | POA: Insufficient documentation

## 2012-02-10 DIAGNOSIS — E785 Hyperlipidemia, unspecified: Secondary | ICD-10-CM | POA: Insufficient documentation

## 2012-02-10 DIAGNOSIS — J4489 Other specified chronic obstructive pulmonary disease: Secondary | ICD-10-CM | POA: Insufficient documentation

## 2012-02-10 DIAGNOSIS — I498 Other specified cardiac arrhythmias: Secondary | ICD-10-CM | POA: Insufficient documentation

## 2012-02-10 DIAGNOSIS — F411 Generalized anxiety disorder: Secondary | ICD-10-CM | POA: Insufficient documentation

## 2012-02-10 DIAGNOSIS — E860 Dehydration: Secondary | ICD-10-CM

## 2012-02-10 DIAGNOSIS — I509 Heart failure, unspecified: Secondary | ICD-10-CM | POA: Insufficient documentation

## 2012-02-10 DIAGNOSIS — H1130 Conjunctival hemorrhage, unspecified eye: Secondary | ICD-10-CM | POA: Insufficient documentation

## 2012-02-10 DIAGNOSIS — J449 Chronic obstructive pulmonary disease, unspecified: Secondary | ICD-10-CM | POA: Insufficient documentation

## 2012-02-10 DIAGNOSIS — R5381 Other malaise: Secondary | ICD-10-CM | POA: Insufficient documentation

## 2012-02-10 LAB — URINALYSIS, ROUTINE W REFLEX MICROSCOPIC
Bilirubin Urine: NEGATIVE
Glucose, UA: NEGATIVE mg/dL
Ketones, ur: NEGATIVE mg/dL
Specific Gravity, Urine: 1.014 (ref 1.005–1.030)
pH: 6.5 (ref 5.0–8.0)

## 2012-02-10 LAB — URINE MICROSCOPIC-ADD ON

## 2012-02-10 LAB — BASIC METABOLIC PANEL
BUN: 17 mg/dL (ref 6–23)
CO2: 28 mEq/L (ref 19–32)
Calcium: 9.8 mg/dL (ref 8.4–10.5)
Chloride: 101 mEq/L (ref 96–112)
Creatinine, Ser: 0.98 mg/dL (ref 0.50–1.10)
Glucose, Bld: 121 mg/dL — ABNORMAL HIGH (ref 70–99)

## 2012-02-10 LAB — CBC
HCT: 40.6 % (ref 36.0–46.0)
Hemoglobin: 13.5 g/dL (ref 12.0–15.0)
MCH: 30.6 pg (ref 26.0–34.0)
MCV: 92.1 fL (ref 78.0–100.0)
RBC: 4.41 MIL/uL (ref 3.87–5.11)

## 2012-02-10 MED ORDER — SODIUM CHLORIDE 0.9 % IV BOLUS (SEPSIS)
750.0000 mL | INTRAVENOUS | Status: AC
  Administered 2012-02-10: 750 mL via INTRAVENOUS

## 2012-02-10 NOTE — ED Provider Notes (Signed)
History     CSN: [id number]  Arrival date & time 02/10/12  1731   First MD Initiated Contact with Patient 02/10/12 1741      Chief Complaint  Patient presents with  . Weakness    (Consider location/radiation/quality/duration/timing/severity/associated sxs/prior treatment) HPI Comments: Ms. Herrel presents for evaluation of feeling weak and "just plain bad".  She states that she felt bad prior to beginning her daily routine.  She had no appetite but denies fever, nausea, vomiting, diarrhea, cough, SOB.  She took her vital signs and temperature at home and the only abnormality was an elevated HR.  It has persisted throughout the day.  She reports just feeling tired but denies unilateral weakness, visual changes, HA.  She reports a mild chest tightness but is unable to further elaborate on the symptoms.  Patient is a 78 y.o. female presenting with weakness. The history is provided by the patient. No language interpreter was used.  Weakness Primary symptoms do not include headaches, syncope, loss of consciousness, altered mental status, seizures, dizziness, visual change, paresthesias, focal weakness, loss of sensation, speech change, memory loss, fever, nausea or vomiting. Episode onset: awoke with symptoms this morning. The symptoms are improving. The neurological symptoms are diffuse. The symptoms occurred while sleeping.  Additional symptoms include weakness and anxiety. Additional symptoms do not include neck stiffness, pain, lower back pain, leg pain, loss of balance, photophobia, aura, hallucinations, nystagmus, taste disturbance, hyperacusis, hearing loss, tinnitus, vertigo, irritability or dysphoric mood. Medical issues also include cancer, diabetes and hypertension. Medical issues do not include seizures, cerebral vascular accident, alcohol use, drug use or recent surgery.    Past Medical History  Diagnosis Date  . HTN (hypertension)   . A-fib   . Dyslipidemia   . Hypothyroidism   .  Stroke   . Asthma   . Shortness of breath   . DEMENTIA   . CHF (congestive heart failure)   . GERD (gastroesophageal reflux disease)   . Anxiety   . COPD (chronic obstructive pulmonary disease)   . STROKE 04/14/2007    Past Surgical History  Procedure Date  . Total abdominal hysterectomy   . Breast surgery     45-50 YEARS AGO LUMP REMOVED  . Appendectomy   . Cardioversion 06/26/2011    Procedure: CARDIOVERSION;  Surgeon: Marca Ancona, MD;  Location: [facility] OR;  Service: Cardiovascular;  Laterality: N/A;    Family History  Problem Relation Age of Onset  . Coronary artery disease Neg Hx   . Stroke Brother   . Breast cancer Mother     History  Substance Use Topics  . Smoking status: Never Smoker   . Smokeless tobacco: Not on file   Comment: does not smoke   . Alcohol Use: No    OB History    Grav Para Term Preterm Abortions TAB SAB Ect Mult Living                  Review of Systems  Constitutional: Positive for appetite change and fatigue. Negative for fever, chills, diaphoresis, activity change and irritability.  HENT: Negative for hearing loss, congestion, sore throat, facial swelling, rhinorrhea, drooling, trouble swallowing, neck pain, neck stiffness, sinus pressure and tinnitus.   Eyes: Negative for photophobia and visual disturbance.  Respiratory: Positive for chest tightness. Negative for apnea, cough and shortness of breath.   Cardiovascular: Positive for palpitations. Negative for chest pain, leg swelling and syncope.  Gastrointestinal: Negative for nausea, vomiting, abdominal pain, diarrhea and abdominal distention.  Genitourinary: Negative.  Negative for dysuria, urgency, frequency, hematuria, flank pain and difficulty urinating.  Musculoskeletal: Positive for back pain. Negative for myalgias, joint swelling and arthralgias.  Skin: Negative for rash.  Neurological: Positive for weakness and light-headedness. Negative for dizziness, vertigo, tremors, speech  change, focal weakness, seizures, loss of consciousness, syncope, speech difficulty, headaches, paresthesias and loss of balance.  Psychiatric/Behavioral: Negative for hallucinations, memory loss, dysphoric mood and altered mental status.    Allergies  Ciprofloxacin; Quinolones; Sulfonamide derivatives; and Zolpidem tartrate  Home Medications   Current Outpatient Rx  Name Route Sig Dispense Refill  . ALBUTEROL SULFATE HFA 108 (90 BASE) MCG/ACT IN AERS Inhalation Inhale 2 puffs into the lungs every 6 (six) hours as needed. For shortness of breath/wheezing    . ALPRAZOLAM 0.25 MG PO TABS Oral Take 0.25 mg by mouth daily as needed. For anxiety    . AMLODIPINE BESYLATE 10 MG PO TABS Oral Take 10 mg by mouth daily.     . AMOXICILLIN-POT CLAVULANATE 875-125 MG PO TABS Oral Take 1 tablet by mouth every 12 (twelve) hours. 10 tablet 0  . ATORVASTATIN CALCIUM 20 MG PO TABS Oral Take 10 mg by mouth every other day.    Marland Kitchen FLUTICASONE-SALMETEROL 250-50 MCG/DOSE IN AEPB Inhalation Inhale 1 puff into the lungs 2 (two) times daily.     . FUROSEMIDE 40 MG PO TABS Oral Take 40 mg by mouth daily.    Marland Kitchen LEVOTHYROXINE SODIUM 25 MCG PO TABS Oral Take 25 mcg by mouth daily.     Marland Kitchen LOSARTAN POTASSIUM 100 MG PO TABS Oral Take 100 mg by mouth daily.     Marland Kitchen METOPROLOL SUCCINATE ER 100 MG PO TB24 Oral Take 100 mg by mouth daily.     Marland Kitchen PROPAFENONE HCL 225 MG PO TABS Oral Take 225 mg by mouth 3 (three) times daily.    Marland Kitchen TEMAZEPAM 15 MG PO CAPS Oral Take 15 mg by mouth at bedtime.     Marland Kitchen TIOTROPIUM BROMIDE MONOHYDRATE 18 MCG IN CAPS Inhalation Place 18 mcg into inhaler and inhale daily.     . WARFARIN SODIUM 2 MG PO TABS Oral Take 2-4 mg by mouth daily. 2mg  on Monday, Wednesday, Friday. 4mg  on Tuesday, Thursday, Saturday and Sunday      Pulse 115  Temp 97.8 F (36.6 C) (Oral)  Resp 27  SpO2 98%  Physical Exam  Nursing note and vitals reviewed. Constitutional: She is oriented to person, place, and time. She appears  well-developed and well-nourished. No distress. She is not intubated.  HENT:  Head: Normocephalic and atraumatic.  Right Ear: External ear normal.  Left Ear: External ear normal.  Nose: Nose normal.  Mouth/Throat: Oropharynx is clear and moist. No oropharyngeal exudate.  Eyes: EOM are normal. Pupils are equal, round, and reactive to light. Right eye exhibits no discharge. Left eye exhibits no discharge. Right conjunctiva is not injected. Right conjunctiva has a hemorrhage. Left conjunctiva is not injected. Left conjunctiva has no hemorrhage. No scleral icterus. Right eye exhibits normal extraocular motion and no nystagmus. Left eye exhibits normal extraocular motion and no nystagmus. Right pupil is round and reactive. Left pupil is round. Pupils are equal.         Subconjunctival hemorrhage as marked (per pt this occurred last wk)  Neck: Normal range of motion. Neck supple. No JVD present. No tracheal deviation present. No thyromegaly present.  Cardiovascular: Regular rhythm and normal pulses.   No extrasystoles are present. Tachycardia present.  PMI  is not displaced.  Exam reveals distant heart sounds. Exam reveals no gallop.   No murmur heard. Pulmonary/Chest: No accessory muscle usage or stridor. No apnea, not tachypneic and not bradypneic. She is not intubated. No respiratory distress. She has no decreased breath sounds. She has no wheezes. She has no rhonchi. She has no rales. She exhibits no tenderness, no bony tenderness, no crepitus and no retraction.  Abdominal: Soft. Bowel sounds are normal. She exhibits no distension and no mass. There is no tenderness. There is no rebound and no guarding.  Musculoskeletal: Normal range of motion. She exhibits no edema.  Lymphadenopathy:    She has no cervical adenopathy.  Neurological: She is alert and oriented to person, place, and time. She has normal strength. She displays no tremor. No cranial nerve deficit or sensory deficit. She exhibits normal  muscle tone. She displays no seizure activity. Coordination normal. GCS eye subscore is 4. GCS verbal subscore is 5. GCS motor subscore is 6.  Skin: Skin is warm and dry. No rash noted. She is not diaphoretic. No erythema. No pallor.  Psychiatric: She has a normal mood and affect. Her behavior is normal.    ED Course  Procedures (including critical care time)  Labs Reviewed - No data to display No results found.   No diagnosis found.   Date: 02/10/2012  Rate: 113 bpm  Rhythm: sinus tachycardia  QRS Axis: left  Intervals: normal  ST/T Wave abnormalities: nonspecific ST changes  Conduction Disutrbances:left anterior fascicular block  Narrative Interpretation: + anteroseptal q waves  Old EKG Reviewed: note changed rate      MDM  Pt presents for evaluation of tachycardia and a generalized ill feeling.  She is awake, oriented, and without focal neurologic deficit.  She appears nontoxic.  She does have noted tachycardia.  She was just treated for a uti.  Will obtain U/A and culture .  Will also obtain basic labs, trop, and CXR.  IVF have been ordered.  2135.  Pt stable, NAD.  Negative orthostatic VS.  HR now <100 s/p IVF.  Pt has a nl neurologic exam.  Although U/A is consistent with a UTI, she is on a course of abx now and has been on multiple abx courses.  Her PMD stopped treating per her daughter because she ha been asymptomatic.  Will obtain a urine culture.  Plan discharge home to f/u with her PMD.      Tobin Chad, MD 02/10/12 2139

## 2012-02-10 NOTE — ED Notes (Signed)
Patient transported to X-ray 

## 2012-02-10 NOTE — ED Notes (Signed)
Patient states she is unable to void at this time.  

## 2012-02-10 NOTE — ED Notes (Signed)
MD at bedside. 

## 2012-02-10 NOTE — Telephone Encounter (Signed)
Pt having bad BP day, bottom number over 100, took a xanax, but 139/78 now pulse was 109, pls advise [phone]

## 2012-02-10 NOTE — ED Notes (Signed)
Pt ambulated to restroom with stand by assistance. Urine specimen collected.

## 2012-02-10 NOTE — ED Notes (Signed)
Ok for patient to eat & drink per Dr. Lorenso Courier. Pt's daughter provided patient with food.

## 2012-02-10 NOTE — ED Notes (Signed)
Patient is out of the room at this time. Gone with xray.  Orthostatics to be done on her return.

## 2012-02-10 NOTE — ED Notes (Signed)
From home; c/o generalized weakness and palpitations. Hx A-flutter/A-fib. Currently in A-flutter with HR 110s.

## 2012-02-10 NOTE — ED Notes (Signed)
Patient and daughter is requesting to leave AMA. While speaking with pt/daughter, Dr. Lorenso Courier has come into the room and is speaking with pt/family.

## 2012-02-11 NOTE — Telephone Encounter (Signed)
Per daughter - they were not aware that I was not in the office yesterday and called Dr Perini's office after not hearing back from [place].  They sent pt the to ED for evaluation.  It was determined that pt was dehydrated and was given IV fluids.  She is better today (at this time)  I apologized to the daughter several times as her phone message should have been directed to a triage nurse in my absence (out of office notice was on) and with the message being marked as urgent.   I requested the daughter make sure I am in the office next time she calls to leave a message and asked her to call back if she doesn't receive a return call within 1 hour.  She stated understanding and thanked me for my caring to call back and let her know what had occurred.   Both the nurse manager and office manager where sent information about the issue with this call.

## 2012-02-14 LAB — URINE CULTURE: Colony Count: >=100000

## 2012-02-15 NOTE — ED Notes (Signed)
+   Urine Chart sent to EDP office for review. 

## 2012-02-17 NOTE — ED Notes (Signed)
Chart returned from EDP office . Give Keflex 500 mg po BID x 7 days . Return for worsening symptom per [facility].

## 2012-06-27 ENCOUNTER — Emergency Department (HOSPITAL_COMMUNITY): Payer: Medicare Other

## 2012-06-27 ENCOUNTER — Inpatient Hospital Stay (HOSPITAL_COMMUNITY)
Admission: EM | Admit: 2012-06-27 | Discharge: 2012-07-03 | DRG: 191 | Disposition: A | Discharge status: Home Health | Payer: Medicare Other | Attending: Internal Medicine | Admitting: Internal Medicine

## 2012-06-27 ENCOUNTER — Encounter (HOSPITAL_COMMUNITY): Payer: Self-pay | Admitting: *Deleted

## 2012-06-27 DIAGNOSIS — N390 Urinary tract infection, site not specified: Secondary | ICD-10-CM | POA: Diagnosis present

## 2012-06-27 DIAGNOSIS — J441 Chronic obstructive pulmonary disease with (acute) exacerbation: Principal | ICD-10-CM | POA: Diagnosis present

## 2012-06-27 DIAGNOSIS — Z881 Allergy status to other antibiotic agents status: Secondary | ICD-10-CM

## 2012-06-27 DIAGNOSIS — I4891 Unspecified atrial fibrillation: Secondary | ICD-10-CM | POA: Diagnosis present

## 2012-06-27 DIAGNOSIS — F0390 Unspecified dementia without behavioral disturbance: Secondary | ICD-10-CM | POA: Diagnosis present

## 2012-06-27 DIAGNOSIS — Z79899 Other long term (current) drug therapy: Secondary | ICD-10-CM

## 2012-06-27 DIAGNOSIS — E785 Hyperlipidemia, unspecified: Secondary | ICD-10-CM | POA: Diagnosis present

## 2012-06-27 DIAGNOSIS — I951 Orthostatic hypotension: Secondary | ICD-10-CM

## 2012-06-27 DIAGNOSIS — Z7901 Long term (current) use of anticoagulants: Secondary | ICD-10-CM

## 2012-06-27 DIAGNOSIS — R42 Dizziness and giddiness: Secondary | ICD-10-CM

## 2012-06-27 DIAGNOSIS — R0600 Dyspnea, unspecified: Secondary | ICD-10-CM

## 2012-06-27 DIAGNOSIS — F411 Generalized anxiety disorder: Secondary | ICD-10-CM | POA: Diagnosis present

## 2012-06-27 DIAGNOSIS — I5032 Chronic diastolic (congestive) heart failure: Secondary | ICD-10-CM | POA: Diagnosis present

## 2012-06-27 DIAGNOSIS — J449 Chronic obstructive pulmonary disease, unspecified: Secondary | ICD-10-CM

## 2012-06-27 DIAGNOSIS — Z66 Do not resuscitate: Secondary | ICD-10-CM | POA: Diagnosis present

## 2012-06-27 DIAGNOSIS — E039 Hypothyroidism, unspecified: Secondary | ICD-10-CM | POA: Diagnosis present

## 2012-06-27 DIAGNOSIS — Z888 Allergy status to other drugs, medicaments and biological substances status: Secondary | ICD-10-CM

## 2012-06-27 DIAGNOSIS — B370 Candidal stomatitis: Secondary | ICD-10-CM | POA: Diagnosis present

## 2012-06-27 DIAGNOSIS — I4892 Unspecified atrial flutter: Secondary | ICD-10-CM | POA: Diagnosis present

## 2012-06-27 DIAGNOSIS — R05 Cough: Secondary | ICD-10-CM

## 2012-06-27 DIAGNOSIS — IMO0002 Reserved for concepts with insufficient information to code with codable children: Secondary | ICD-10-CM

## 2012-06-27 DIAGNOSIS — I10 Essential (primary) hypertension: Secondary | ICD-10-CM | POA: Diagnosis present

## 2012-06-27 DIAGNOSIS — I959 Hypotension, unspecified: Secondary | ICD-10-CM | POA: Diagnosis present

## 2012-06-27 DIAGNOSIS — Z882 Allergy status to sulfonamides status: Secondary | ICD-10-CM

## 2012-06-27 DIAGNOSIS — Z8673 Personal history of transient ischemic attack (TIA), and cerebral infarction without residual deficits: Secondary | ICD-10-CM | POA: Diagnosis present

## 2012-06-27 DIAGNOSIS — I509 Heart failure, unspecified: Secondary | ICD-10-CM | POA: Diagnosis present

## 2012-06-27 DIAGNOSIS — K219 Gastro-esophageal reflux disease without esophagitis: Secondary | ICD-10-CM | POA: Diagnosis present

## 2012-06-27 LAB — CBC WITH DIFFERENTIAL/PLATELET
Basophils Absolute: 0 10*3/uL (ref 0.0–0.1)
Basophils Relative: 0 % (ref 0–1)
Eosinophils Absolute: 0 10*3/uL (ref 0.0–0.7)
Eosinophils Relative: 0 % (ref 0–5)
MCH: 30.4 pg (ref 26.0–34.0)
MCHC: 33 g/dL (ref 30.0–36.0)
MCV: 92.2 fL (ref 78.0–100.0)
Neutrophils Relative %: 84 % — ABNORMAL HIGH (ref 43–77)
Platelets: 217 10*3/uL (ref 150–400)
RDW: 13.5 % (ref 11.5–15.5)

## 2012-06-27 LAB — POCT I-STAT 3, ART BLOOD GAS (G3+)
Acid-base deficit: 1 mmol/L (ref 0.0–2.0)
O2 Saturation: 95 %
TCO2: 27 mmol/L (ref 0–100)
pCO2 arterial: 49.4 mmHg — ABNORMAL HIGH (ref 35.0–45.0)

## 2012-06-27 LAB — BASIC METABOLIC PANEL
Calcium: 8.8 mg/dL (ref 8.4–10.5)
GFR calc Af Amer: 43 mL/min — ABNORMAL LOW (ref >90)
GFR calc non Af Amer: 37 mL/min — ABNORMAL LOW (ref >90)
Glucose, Bld: 132 mg/dL — ABNORMAL HIGH (ref 70–99)
Sodium: 135 mEq/L (ref 135–145)

## 2012-06-27 LAB — PRO B NATRIURETIC PEPTIDE: Pro B Natriuretic peptide (BNP): 1554 pg/mL — ABNORMAL HIGH (ref 0–450)

## 2012-06-27 MED ORDER — ALBUTEROL SULFATE (5 MG/ML) 0.5% IN NEBU
5.0000 mg | INHALATION_SOLUTION | Freq: Once | RESPIRATORY_TRACT | Status: AC
  Administered 2012-06-27: 5 mg via RESPIRATORY_TRACT
  Filled 2012-06-27: qty 1

## 2012-06-27 MED ORDER — METHYLPREDNISOLONE SODIUM SUCC 125 MG IJ SOLR
125.0000 mg | Freq: Once | INTRAMUSCULAR | Status: AC
  Administered 2012-06-27: 125 mg via INTRAVENOUS
  Filled 2012-06-27: qty 2

## 2012-06-27 MED ORDER — IPRATROPIUM BROMIDE 0.02 % IN SOLN
0.5000 mg | Freq: Once | RESPIRATORY_TRACT | Status: AC
  Administered 2012-06-27: 0.5 mg via RESPIRATORY_TRACT
  Filled 2012-06-27: qty 2.5

## 2012-06-27 NOTE — ED Notes (Signed)
Resp tech in room

## 2012-06-27 NOTE — ED Notes (Signed)
Nebulizer treatment complete.

## 2012-06-27 NOTE — ED Notes (Signed)
During ambulation pt o2 saturation dropped to 50%

## 2012-06-27 NOTE — ED Notes (Signed)
Pt from home where she lives alone. Arrived via [facility] c/o SOB, and productive cough with yellow sputum. Per EMS Pt hx of Afib, but SR on their 12 lead, CBG 104. 18 ga placed by EMS in Rt forearm, and administered 500 ML NS. Also EMS administered neb treatment. Pt EMS CBG 104

## 2012-06-27 NOTE — ED Provider Notes (Signed)
History     CSN: [id number]  Arrival date & time 06/27/12  2028   First MD Initiated Contact with Patient 06/27/12 2036      Chief Complaint  Patient presents with  . Shortness of Breath   HPI  History provided by the patient. Patient is an 79 year old female with past history of hypertension, dyslipidemia, CHF, COPD and past A. fib presents with complaints of worsening shortness of breath. Patient states that she has a chronic cough but had worsening productive cough with yellow sputum over the past 3 days. Patient has been using her home albuterol and Advair without significant improvement patient also reports some associated heart palpitations similar to previous A. fib. Patient states that she believes she had currently been in the sinus rhythm. Patient does take Coumadin. She denies any fever, chills or sweats. Denies any chest pain. Denies any increased swelling of the legs or arms. She denies any other aggravating or alleviating factors. Denies any other associated symptoms.    Past Medical History  Diagnosis Date  . HTN (hypertension)   . A-fib   . Dyslipidemia   . Hypothyroidism   . Stroke   . Asthma   . Shortness of breath   . DEMENTIA   . CHF (congestive heart failure)   . GERD (gastroesophageal reflux disease)   . Anxiety   . COPD (chronic obstructive pulmonary disease)   . STROKE 04/14/2007    Past Surgical History  Procedure Date  . Total abdominal hysterectomy   . Breast surgery     45-50 YEARS AGO LUMP REMOVED  . Appendectomy   . Cardioversion 06/26/2011    Procedure: CARDIOVERSION;  Surgeon: Marca Ancona, MD;  [facility]: [facility] OR;  Service: Cardiovascular;  Laterality: N/A;    Family History  Problem Relation Age of Onset  . Coronary artery disease Neg Hx   . Stroke Brother   . Breast cancer Mother     History  Substance Use Topics  . Smoking status: Never Smoker   . Smokeless tobacco: Not on file     Comment: does not smoke   . Alcohol Use: No     OB History    Grav Para Term Preterm Abortions TAB SAB Ect Mult Living                  Review of Systems  Constitutional: Negative for fever and chills.  Respiratory: Positive for cough and shortness of breath.   Cardiovascular: Positive for palpitations. Negative for chest pain and leg swelling.  Gastrointestinal: Negative for nausea, vomiting and abdominal pain.  All other systems reviewed and are negative.    Allergies  Ciprofloxacin; Quinolones; Sulfonamide derivatives; and Zolpidem tartrate  Home Medications   Current Outpatient Rx  Name  Route  Sig  Dispense  Refill  . ALBUTEROL SULFATE HFA 108 (90 BASE) MCG/ACT IN AERS   Inhalation   Inhale 2 puffs into the lungs every 6 (six) hours as needed. For shortness of breath/wheezing         . ALPRAZOLAM 0.25 MG PO TABS   Oral   Take 0.25 mg by mouth daily as needed. For anxiety         . AMLODIPINE BESYLATE 10 MG PO TABS   Oral   Take 10 mg by mouth daily.          . ATORVASTATIN CALCIUM 20 MG PO TABS   Oral   Take 10 mg by mouth every other day.         Marland Kitchen  FLUTICASONE-SALMETEROL 250-50 MCG/DOSE IN AEPB   Inhalation   Inhale 1 puff into the lungs 2 (two) times daily.          Marland Kitchen LEVOTHYROXINE SODIUM 25 MCG PO TABS   Oral   Take 25 mcg by mouth daily.          Marland Kitchen LOSARTAN POTASSIUM 100 MG PO TABS   Oral   Take 100 mg by mouth daily.          Marland Kitchen METOPROLOL SUCCINATE ER 100 MG PO TB24   Oral   Take 100 mg by mouth daily.          Marland Kitchen PROPAFENONE HCL 225 MG PO TABS   Oral   Take 225 mg by mouth 3 (three) times daily.         Marland Kitchen TEMAZEPAM 15 MG PO CAPS   Oral   Take 15 mg by mouth at bedtime.          Marland Kitchen TIOTROPIUM BROMIDE MONOHYDRATE 18 MCG IN CAPS   Inhalation   Place 18 mcg into inhaler and inhale daily.          . WARFARIN SODIUM 2 MG PO TABS   Oral   Take 2-4 mg by mouth daily. 2mg  on Monday, Wednesday, Friday. 4mg  on Tuesday, Thursday, Saturday and Sunday           BP  147/50  Pulse 71  Temp 98 F (36.7 C) (Oral)  SpO2 71%  Physical Exam  Nursing note and vitals reviewed. Constitutional: She is oriented to person, place, and time. She appears well-developed and well-nourished. No distress.  HENT:  Head: Normocephalic.  Mouth/Throat: Oropharynx is clear and moist.  Neck: Normal range of motion. Neck supple.  Cardiovascular: Normal rate and regular rhythm.   Pulmonary/Chest: No stridor. Tachypnea noted. She has wheezes.  Abdominal: Soft. There is no tenderness.  Musculoskeletal: She exhibits no edema and no tenderness.       No clinical signs concerning for DVT  Neurological: She is alert and oriented to person, place, and time.  Skin: Skin is warm and dry. No rash noted.  Psychiatric: She has a normal mood and affect. Her behavior is normal.    ED Course  Procedures   Results for orders placed during the hospital encounter of 06/27/12  CBC WITH DIFFERENTIAL      Component Value Range   WBC 7.0  4.0 - 10.5 K/uL   RBC 3.98  3.87 - 5.11 MIL/uL   Hemoglobin 12.1  12.0 - 15.0 g/dL   HCT 16.1  09.6 - 04.5 %   MCV 92.2  78.0 - 100.0 fL   MCH 30.4  26.0 - 34.0 pg   MCHC 33.0  30.0 - 36.0 g/dL   RDW 40.9  81.1 - 91.4 %   Platelets 217  150 - 400 K/uL   Neutrophils Relative 84 (*) 43 - 77 %   Neutro Abs 5.9  1.7 - 7.7 K/uL   Lymphocytes Relative 10 (*) 12 - 46 %   Lymphs Abs 0.7  0.7 - 4.0 K/uL   Monocytes Relative 5  3 - 12 %   Monocytes Absolute 0.4  0.1 - 1.0 K/uL   Eosinophils Relative 0  0 - 5 %   Eosinophils Absolute 0.0  0.0 - 0.7 K/uL   Basophils Relative 0  0 - 1 %   Basophils Absolute 0.0  0.0 - 0.1 K/uL  BASIC METABOLIC PANEL      Component  Value Range   Sodium 135  135 - 145 mEq/L   Potassium 4.9  3.5 - 5.1 mEq/L   Chloride 100  96 - 112 mEq/L   CO2 26  19 - 32 mEq/L   Glucose, Bld 132 (*) 70 - 99 mg/dL   BUN 21  6 - 23 mg/dL   Creatinine, Ser 1.47 (*) 0.50 - 1.10 mg/dL   Calcium 8.8  8.4 - 82.9 mg/dL   GFR calc non Af  Amer 37 (*) >90 mL/min   GFR calc Af Amer 43 (*) >90 mL/min  D-DIMER, QUANTITATIVE      Component Value Range   D-Dimer, Quant <0.27  0.00 - 0.48 ug/mL-FEU  PRO B NATRIURETIC PEPTIDE      Component Value Range   Pro B Natriuretic peptide (BNP) 1554.0 (*) 0 - 450 pg/mL  PROTIME-INR      Component Value Range   Prothrombin Time 31.7 (*) 11.6 - 15.2 seconds   INR 3.30 (*) 0.00 - 1.49  POCT I-STAT 3, BLOOD GAS (G3+)      Component Value Range   pH, Arterial 7.323 (*) 7.350 - 7.450   pCO2 arterial 49.4 (*) 35.0 - 45.0 mmHg   pO2, Arterial 80.0  80.0 - 100.0 mmHg   Bicarbonate 25.6 (*) 20.0 - 24.0 mEq/L   TCO2 27  0 - 100 mmol/L   O2 Saturation 95.0     Acid-base deficit 1.0  0.0 - 2.0 mmol/L   Collection site RADIAL, ALLEN'S TEST ACCEPTABLE     Drawn by RT     Sample type ARTERIAL          Dg Chest Portable 1 View  06/27/2012  *RADIOLOGY REPORT*  Clinical Data: Shortness of breath.  PORTABLE CHEST - 1 VIEW  Comparison: 02/10/2012  Findings: Borderline heart size with normal pulmonary vascularity. The lungs appear hyperinflated with scattered fibrosis consistent with emphysema.  No focal airspace consolidation.  There may be blunting of the left costophrenic angle due to the heart shadow or too small pleural effusion.  No pneumothorax.  Mediastinal contours appear intact.  Calcified and tortuous aorta.  IMPRESSION: Emphysematous changes and scattered fibrosis in the lungs. Possible small left pleural effusion.  No focal consolidation.   Original Report Authenticated By: Burman Nieves, M.D.      1. COPD (chronic obstructive pulmonary disease)       MDM  8:45 PM patient seen and evaluated. Patient currently tachypneic and hypoxic. She does not appear in any acute distress. She has frequent coughing. Diffuse wheezing throughout.  Patient was discussed with attending physician. Patient with significant COPD exacerbation and hypoxia. Treatments and steroids given. Anticipate  admission.  Patient has some improvements but continues to have some slight hypoxia with inability on room air.  Spoke with hospitalist. He'll see patient and admitted.      Date: 06/27/2012  Rate: 70  Rhythm: normal sinus rhythm  QRS Axis: normal  Intervals: normal  ST/T Wave abnormalities: nonspecific T wave changes  Conduction Disutrbances:first-degree A-V block   Narrative Interpretation: Incomplete RBBB  Old EKG Reviewed: No significant changes from 02/10/2012. No tachycardia today    Angus Seller, [place] 06/28/12 [phone]

## 2012-06-28 ENCOUNTER — Encounter (HOSPITAL_COMMUNITY): Payer: Self-pay | Admitting: *Deleted

## 2012-06-28 DIAGNOSIS — J449 Chronic obstructive pulmonary disease, unspecified: Secondary | ICD-10-CM

## 2012-06-28 DIAGNOSIS — I5032 Chronic diastolic (congestive) heart failure: Secondary | ICD-10-CM | POA: Diagnosis present

## 2012-06-28 DIAGNOSIS — R6889 Other general symptoms and signs: Secondary | ICD-10-CM

## 2012-06-28 DIAGNOSIS — J441 Chronic obstructive pulmonary disease with (acute) exacerbation: Secondary | ICD-10-CM | POA: Diagnosis present

## 2012-06-28 LAB — COMPREHENSIVE METABOLIC PANEL
Alkaline Phosphatase: 73 U/L (ref 39–117)
BUN: 21 mg/dL (ref 6–23)
Calcium: 8.3 mg/dL — ABNORMAL LOW (ref 8.4–10.5)
GFR calc Af Amer: 48 mL/min — ABNORMAL LOW (ref >90)
Glucose, Bld: 167 mg/dL — ABNORMAL HIGH (ref 70–99)
Total Protein: 6.1 g/dL (ref 6.0–8.3)

## 2012-06-28 LAB — TSH: TSH: 0.648 u[IU]/mL (ref 0.350–4.500)

## 2012-06-28 LAB — CBC
HCT: 32.6 % — ABNORMAL LOW (ref 36.0–46.0)
RDW: 13.5 % (ref 11.5–15.5)
WBC: 3.8 10*3/uL — ABNORMAL LOW (ref 4.0–10.5)

## 2012-06-28 LAB — PROTIME-INR
INR: 3.89 — ABNORMAL HIGH (ref 0.00–1.49)
Prothrombin Time: 35.8 seconds — ABNORMAL HIGH (ref 11.6–15.2)

## 2012-06-28 MED ORDER — PANTOPRAZOLE SODIUM 40 MG PO TBEC
40.0000 mg | DELAYED_RELEASE_TABLET | Freq: Every day | ORAL | Status: DC
  Administered 2012-06-28 – 2012-07-03 (×6): 40 mg via ORAL
  Filled 2012-06-28 (×7): qty 1

## 2012-06-28 MED ORDER — WARFARIN - PHARMACIST DOSING INPATIENT: Freq: Every day | Status: DC

## 2012-06-28 MED ORDER — DOXYCYCLINE HYCLATE 100 MG IV SOLR
100.0000 mg | Freq: Two times a day (BID) | INTRAVENOUS | Status: DC
  Administered 2012-06-28: 100 mg via INTRAVENOUS
  Filled 2012-06-28 (×3): qty 100

## 2012-06-28 MED ORDER — TEMAZEPAM 15 MG PO CAPS
15.0000 mg | ORAL_CAPSULE | Freq: Every day | ORAL | Status: DC
  Administered 2012-06-28 – 2012-07-02 (×5): 15 mg via ORAL
  Filled 2012-06-28 (×5): qty 1

## 2012-06-28 MED ORDER — ALPRAZOLAM 0.25 MG PO TABS
0.2500 mg | ORAL_TABLET | Freq: Every day | ORAL | Status: DC | PRN
  Administered 2012-06-30: 0.25 mg via ORAL
  Filled 2012-06-28: qty 1

## 2012-06-28 MED ORDER — FUROSEMIDE 10 MG/ML IJ SOLN
20.0000 mg | Freq: Once | INTRAMUSCULAR | Status: AC
Start: 2012-06-28 — End: 2012-06-28
  Administered 2012-06-28: 20 mg via INTRAVENOUS
  Filled 2012-06-28: qty 2

## 2012-06-28 MED ORDER — ACETAMINOPHEN 650 MG RE SUPP: 650.0000 mg | Freq: Four times a day (QID) | RECTAL | Status: DC | PRN

## 2012-06-28 MED ORDER — LEVOTHYROXINE SODIUM 25 MCG PO TABS
25.0000 ug | ORAL_TABLET | Freq: Every day | ORAL | Status: DC
  Administered 2012-06-28 – 2012-07-03 (×6): 25 ug via ORAL
  Filled 2012-06-28 (×7): qty 1

## 2012-06-28 MED ORDER — LOSARTAN POTASSIUM 50 MG PO TABS
100.0000 mg | ORAL_TABLET | Freq: Every day | ORAL | Status: DC
  Administered 2012-06-28 – 2012-06-30 (×3): 100 mg via ORAL
  Filled 2012-06-28 (×4): qty 2

## 2012-06-28 MED ORDER — ALBUTEROL SULFATE (5 MG/ML) 0.5% IN NEBU
2.5000 mg | INHALATION_SOLUTION | RESPIRATORY_TRACT | Status: DC | PRN
  Filled 2012-06-28 (×2): qty 0.5

## 2012-06-28 MED ORDER — ACETAMINOPHEN 325 MG PO TABS: 650.0000 mg | ORAL_TABLET | Freq: Four times a day (QID) | ORAL | Status: DC | PRN

## 2012-06-28 MED ORDER — TIOTROPIUM BROMIDE MONOHYDRATE 18 MCG IN CAPS
18.0000 ug | ORAL_CAPSULE | Freq: Every day | RESPIRATORY_TRACT | Status: DC
  Administered 2012-06-28: 18 ug via RESPIRATORY_TRACT
  Filled 2012-06-28: qty 5

## 2012-06-28 MED ORDER — DOXYCYCLINE HYCLATE 100 MG PO TABS
100.0000 mg | ORAL_TABLET | Freq: Two times a day (BID) | ORAL | Status: DC
  Administered 2012-06-28 – 2012-07-03 (×11): 100 mg via ORAL
  Filled 2012-06-28 (×12): qty 1

## 2012-06-28 MED ORDER — ALBUTEROL SULFATE (5 MG/ML) 0.5% IN NEBU
2.5000 mg | INHALATION_SOLUTION | Freq: Four times a day (QID) | RESPIRATORY_TRACT | Status: DC
  Administered 2012-06-28 – 2012-06-30 (×9): 2.5 mg via RESPIRATORY_TRACT
  Filled 2012-06-28 (×10): qty 0.5

## 2012-06-28 MED ORDER — IPRATROPIUM BROMIDE 0.02 % IN SOLN
0.5000 mg | Freq: Four times a day (QID) | RESPIRATORY_TRACT | Status: DC
  Administered 2012-06-28 – 2012-06-30 (×9): 0.5 mg via RESPIRATORY_TRACT
  Filled 2012-06-28 (×10): qty 2.5

## 2012-06-28 MED ORDER — MOMETASONE FURO-FORMOTEROL FUM 100-5 MCG/ACT IN AERO
2.0000 | INHALATION_SPRAY | Freq: Two times a day (BID) | RESPIRATORY_TRACT | Status: DC
  Administered 2012-06-28 – 2012-06-30 (×5): 2 via RESPIRATORY_TRACT
  Filled 2012-06-28 (×2): qty 8.8

## 2012-06-28 MED ORDER — SENNOSIDES-DOCUSATE SODIUM 8.6-50 MG PO TABS
2.0000 | ORAL_TABLET | Freq: Every day | ORAL | Status: DC
  Administered 2012-06-29 – 2012-07-02 (×3): 2 via ORAL
  Filled 2012-06-28 (×6): qty 2

## 2012-06-28 MED ORDER — AMLODIPINE BESYLATE 10 MG PO TABS
10.0000 mg | ORAL_TABLET | Freq: Every day | ORAL | Status: DC
  Administered 2012-06-28 – 2012-06-30 (×3): 10 mg via ORAL
  Filled 2012-06-28 (×4): qty 1

## 2012-06-28 MED ORDER — METOPROLOL SUCCINATE ER 100 MG PO TB24
100.0000 mg | ORAL_TABLET | Freq: Every day | ORAL | Status: DC
  Administered 2012-06-28 – 2012-07-03 (×4): 100 mg via ORAL
  Filled 2012-06-28 (×6): qty 1

## 2012-06-28 MED ORDER — METHYLPREDNISOLONE SODIUM SUCC 125 MG IJ SOLR
60.0000 mg | Freq: Four times a day (QID) | INTRAMUSCULAR | Status: DC
  Administered 2012-06-28 – 2012-06-29 (×5): 60 mg via INTRAVENOUS
  Filled 2012-06-28 (×8): qty 0.96

## 2012-06-28 MED ORDER — PROPAFENONE HCL 225 MG PO TABS
225.0000 mg | ORAL_TABLET | Freq: Three times a day (TID) | ORAL | Status: DC
  Administered 2012-06-28 (×2): 225 mg via ORAL
  Filled 2012-06-28 (×4): qty 1

## 2012-06-28 MED ORDER — POLYETHYLENE GLYCOL 3350 17 G PO PACK
17.0000 g | PACK | Freq: Once | ORAL | Status: AC
  Administered 2012-06-28: 17 g via ORAL
  Filled 2012-06-28: qty 1

## 2012-06-28 MED ORDER — MENTHOL 3 MG MT LOZG
1.0000 | LOZENGE | OROMUCOSAL | Status: DC | PRN
  Administered 2012-06-29 – 2012-07-01 (×3): 3 mg via ORAL
  Filled 2012-06-28: qty 9

## 2012-06-28 MED ORDER — METHYLPREDNISOLONE SODIUM SUCC 125 MG IJ SOLR
125.0000 mg | Freq: Three times a day (TID) | INTRAMUSCULAR | Status: DC
  Administered 2012-06-28: 125 mg via INTRAVENOUS
  Filled 2012-06-28 (×4): qty 2

## 2012-06-28 MED ORDER — ALBUTEROL SULFATE (5 MG/ML) 0.5% IN NEBU
2.5000 mg | INHALATION_SOLUTION | RESPIRATORY_TRACT | Status: DC
  Administered 2012-06-28: 2.5 mg via RESPIRATORY_TRACT
  Filled 2012-06-28: qty 0.5

## 2012-06-28 MED ORDER — BENZONATATE 100 MG PO CAPS
100.0000 mg | ORAL_CAPSULE | Freq: Three times a day (TID) | ORAL | Status: DC
  Administered 2012-06-28 – 2012-07-02 (×10): 100 mg via ORAL
  Filled 2012-06-28 (×15): qty 1

## 2012-06-28 MED ORDER — SODIUM CHLORIDE 0.9 % IJ SOLN
3.0000 mL | Freq: Two times a day (BID) | INTRAMUSCULAR | Status: DC
  Administered 2012-06-28 – 2012-07-03 (×12): 3 mL via INTRAVENOUS
  Filled 2012-06-28: qty 3

## 2012-06-28 MED ORDER — ATORVASTATIN CALCIUM 10 MG PO TABS
10.0000 mg | ORAL_TABLET | ORAL | Status: DC
  Administered 2012-06-28 – 2012-07-02 (×3): 10 mg via ORAL
  Filled 2012-06-28 (×3): qty 1

## 2012-06-28 MED ORDER — PROPAFENONE HCL 150 MG PO TABS
150.0000 mg | ORAL_TABLET | Freq: Three times a day (TID) | ORAL | Status: DC
  Administered 2012-06-29 – 2012-07-03 (×13): 150 mg via ORAL
  Filled 2012-06-28 (×15): qty 1

## 2012-06-28 NOTE — ED Provider Notes (Signed)
Medical screening examination/treatment/procedure(s) were performed by non-physician practitioner and as supervising physician I was immediately available for consultation/collaboration.  Aiken Withem R. Philamena Kramar, MD 06/28/12 2348 

## 2012-06-28 NOTE — Progress Notes (Signed)
ANTICOAGULATION CONSULT NOTE - Initial Consult  Pharmacy Consult for Coumadin Indication: atrial fibrillation  Allergies  Allergen Reactions  . Ciprofloxacin     unknown  . Quinolones     unknown  . Sulfonamide Derivatives     unknown  . Zolpidem Tartrate     Hallucinations    Vital Signs: Temp: 98 F (36.7 C) (02/01 2050) Temp src: Oral (02/01 2050) BP: 150/58 mmHg (02/01 2320) Pulse Rate: 72  (02/01 2320)  Labs:  Basename 06/27/12 2231  HGB 12.1  HCT 36.7  PLT 217  APTT --  LABPROT 31.7*  INR 3.30*  HEPARINUNFRC --  CREATININE 1.25*  CKTOTAL --  CKMB --  TROPONINI --    Medical History: Past Medical History  Diagnosis Date  . HTN (hypertension)   . A-fib   . Dyslipidemia   . Hypothyroidism   . Stroke   . Asthma   . Shortness of breath   . DEMENTIA   . CHF (congestive heart failure)   . GERD (gastroesophageal reflux disease)   . Anxiety   . COPD (chronic obstructive pulmonary disease)   . STROKE 04/14/2007    Assessment: 79yo female c/o worsening SOB, admitted for further evaluation, to continue Coumadin for Afib; admitted with supratherapeutic INR.  Goal of Therapy:  INR 2-3   Plan:  Will hold Coumadin and monitor INR for proper resumption and dose adjustments.  Julie Rich PharmD BCPS 06/28/2012,1:10 AM

## 2012-06-28 NOTE — H&P (Signed)
Hospitalist Admission History and Physical  Patient name: Julie Rich Medical record number: [id number] Date of birth: [birth year] Age: 79 y.o. Gender: female  Primary Care Provider: Ezequiel Kayser, MD  Chief Complaint: sob, cough, wheezing  History of Present Illness:This is a 79 y.o. year old female with significant past medical history of CHF, A. fib, airway obstruction presenting with shortness of breath cough and wheezing x3 days. Patient states that she was at home and otherwise baseline state health when she noticed progressive worsening of breathing. Patient has had worsening shortness of breath wheezing and cough that has not improved despite albuterol treatments at home. Symptoms acutely worsened today. Patient fell machine to come to the emergency room for evaluation. Patient does report a baseline history of CHF. Patient is not on daily Lasix for this. Patient denies any orthopnea, PND, lotion the edema. No chest pain. Patient denies any increased salt intake or increase fluid intake. No fevers or chills. No body aches. Patient has had her flu shot this year. Patient is brought to the ED with noted O2 sats in the low 70s on room air. Patient is given ice IV Solu-Medrol as well as DuoNeb treatment and supplemental oxygen with subsequent improvement in oxygenation into the mid 90s. However, patient was reported the setting of a low 70s with ambulation. Patient has never been a smoker but does report extensive secondhand smoke exposure in the past.    Patient Active Problem List  Diagnosis  . HYPERLIPIDEMIA  . HYPERTENSION  . Atrial fibrillation  . STROKE  . PULMONARY NODULE  . AIRWAY OBSTRUCTION  . HEPATIC CYST  . FATIGUE  . COUGH  . Atrial flutter  . Dyspnea  . Acute on chronic diastolic congestive heart failure  . UTI (lower urinary tract infection)  . Dizziness  . Orthostatic hypotension  . Chest pain   Past Medical History: Past Medical History  Diagnosis Date  . HTN  (hypertension)   . A-fib   . Dyslipidemia   . Hypothyroidism   . Stroke   . Asthma   . Shortness of breath   . DEMENTIA   . CHF (congestive heart failure)   . GERD (gastroesophageal reflux disease)   . Anxiety   . COPD (chronic obstructive pulmonary disease)   . STROKE 04/14/2007    Past Surgical History: Past Surgical History  Procedure Date  . Total abdominal hysterectomy   . Breast surgery     45-50 YEARS AGO LUMP REMOVED  . Appendectomy   . Cardioversion 06/26/2011    Procedure: CARDIOVERSION;  Surgeon: Marca Ancona, MD;  Location: [facility] OR;  Service: Cardiovascular;  Laterality: N/A;    Social History: History   Social History  . Marital Status: Divorced    Spouse Name: N/A    Number of Children: N/A  . Years of Education: N/A   Occupational History  . retired    Social History Main Topics  . Smoking status: Never Smoker   . Smokeless tobacco: None     Comment: does not smoke   . Alcohol Use: No  . Drug Use: No  . Sexually Active: Yes   Other Topics Concern  . None   Social History Narrative   Lives alone, has a daughter fairly close by, does not drink a lot of caffeine.     Family History: Family History  Problem Relation Age of Onset  . Coronary artery disease Neg Hx   . Stroke Brother   . Breast cancer Mother  Allergies: Allergies  Allergen Reactions  . Ciprofloxacin     unknown  . Quinolones     unknown  . Sulfonamide Derivatives     unknown  . Zolpidem Tartrate     Hallucinations    Current Facility-Administered Medications  Medication Dose Route Frequency Provider Last Rate Last Dose  . acetaminophen (TYLENOL) tablet 650 mg  650 mg Oral Q6H PRN Doree Albee, MD       Or  . acetaminophen (TYLENOL) suppository 650 mg  650 mg Rectal Q6H PRN Doree Albee, MD      . albuterol (PROVENTIL) (5 MG/ML) 0.5% nebulizer solution 2.5 mg  2.5 mg Nebulization Q4H Doree Albee, MD      . albuterol (PROVENTIL) (5 MG/ML) 0.5% nebulizer  solution 2.5 mg  2.5 mg Nebulization Q2H PRN Doree Albee, MD      . doxycycline (VIBRAMYCIN) 100 mg in dextrose 5 % 250 mL IVPB  100 mg Intravenous Q12H Doree Albee, MD      . furosemide (LASIX) injection 20 mg  20 mg Intravenous Once Doree Albee, MD      . ipratropium (ATROVENT) nebulizer solution 0.5 mg  0.5 mg Nebulization Q6H Doree Albee, MD      . methylPREDNISolone sodium succinate (SOLU-MEDROL) 125 mg/2 mL injection 125 mg  125 mg Intravenous Q8H Doree Albee, MD      . sodium chloride 0.9 % injection 3 mL  3 mL Intravenous Q12H Doree Albee, MD       Current Outpatient Prescriptions  Medication Sig Dispense Refill  . albuterol (PROVENTIL HFA;VENTOLIN HFA) 108 (90 BASE) MCG/ACT inhaler Inhale 2 puffs into the lungs every 6 (six) hours as needed. For shortness of breath/wheezing      . ALPRAZolam (XANAX) 0.25 MG tablet Take 0.25 mg by mouth daily as needed. For anxiety      . amLODipine (NORVASC) 10 MG tablet Take 10 mg by mouth daily.       Marland Kitchen atorvastatin (LIPITOR) 20 MG tablet Take 10 mg by mouth every other day.      . Fluticasone-Salmeterol (ADVAIR DISKUS) 250-50 MCG/DOSE AEPB Inhale 1 puff into the lungs 2 (two) times daily.       Marland Kitchen levothyroxine (SYNTHROID, LEVOTHROID) 25 MCG tablet Take 25 mcg by mouth daily.       Marland Kitchen losartan (COZAAR) 100 MG tablet Take 100 mg by mouth daily.       . metoprolol (TOPROL-XL) 100 MG 24 hr tablet Take 100 mg by mouth daily.       . propafenone (RYTHMOL) 225 MG tablet Take 225 mg by mouth 3 (three) times daily.      . temazepam (RESTORIL) 15 MG capsule Take 15 mg by mouth at bedtime.       Marland Kitchen tiotropium (SPIRIVA) 18 MCG inhalation capsule Place 18 mcg into inhaler and inhale daily.       Marland Kitchen warfarin (COUMADIN) 2 MG tablet Take 2-4 mg by mouth daily. 2mg  on Monday, Wednesday, Friday. 4mg  on Tuesday, Thursday, Saturday and Sunday       Review Of Systems: 12 point ROS negative except as noted above in HPI.  Physical Exam: Filed Vitals:    06/27/12 2320  BP: 150/58  Pulse: 72  Temp:   Resp: 26    General: alert and cooperative HEENT: PERRLA and extra ocular movement intact Heart: S1, S2 normal, no murmur, rub or gallop, regular rate and rhythm, trace JVD Lungs: diffuse rhonchii, rales and coarse breath sounds predominantly in lung  bases  Abdomen: abdomen is soft without significant tenderness, masses, organomegaly or guarding Extremities: 2+ peripheral pulses, trace LE edema Skin:no rashes, no ecchymoses Neurology: normal without focal findings and mental status, speech normal, alert and oriented x3  Labs and Imaging: Lab Results  Component Value Date/Time   NA 135 06/27/2012 10:31 PM   K 4.9 06/27/2012 10:31 PM   CL 100 06/27/2012 10:31 PM   CO2 26 06/27/2012 10:31 PM   BUN 21 06/27/2012 10:31 PM   CREATININE 1.25* 06/27/2012 10:31 PM   GLUCOSE 132* 06/27/2012 10:31 PM   Lab Results  Component Value Date   WBC 7.0 06/27/2012   HGB 12.1 06/27/2012   HCT 36.7 06/27/2012   MCV 92.2 06/27/2012   PLT 217 06/27/2012   Lab Results  Component Value Date   DDIMER <0.27 06/27/2012   Lab Results  Component Value Date   INR 3.30* 06/27/2012   INR 1.40 02/10/2012   INR 2.67* 07/05/2011   BNP    Component Value Date/Time   PROBNP 1554.0* 06/27/2012 2231   ABG    Component Value Date/Time   PHART 7.323* 06/27/2012 2225   PCO2ART 49.4* 06/27/2012 2225   PO2ART 80.0 06/27/2012 2225   HCO3 25.6* 06/27/2012 2225   TCO2 27 06/27/2012 2225   ACIDBASEDEF 1.0 06/27/2012 2225   O2SAT 95.0 06/27/2012 2225     Dg Chest Portable 1 View  06/27/2012  *RADIOLOGY REPORT*  Clinical Data: Shortness of breath.  PORTABLE CHEST - 1 VIEW  Comparison: 02/10/2012  Findings: Borderline heart size with normal pulmonary vascularity. The lungs appear hyperinflated with scattered fibrosis consistent with emphysema.  No focal airspace consolidation.  There may be blunting of the left costophrenic angle due to the heart shadow or too small pleural effusion.  No pneumothorax.   Mediastinal contours appear intact.  Calcified and tortuous aorta.  IMPRESSION: Emphysematous changes and scattered fibrosis in the lungs. Possible small left pleural effusion.  No focal consolidation.   Original Report Authenticated By: Burman Nieves, M.D.    EKG: sinus rhythm, 1st degree av block, incomplete RBBB.    Assessment and Plan: JIMMY STIPES is a 79 y.o. year old female  With multiple medical problems including CHF, COPD, A. fib presenting with cough, shortness of breath, wheezing.  Respiratory: Suspect this is likely multifactorial with contributions of COPD exacerbation and CHF exacerbation. We'll start treatment with Solu-Medrol IV as well as IV doxycycline. Will give one 20 mg IV Lasix dose as patient is mildly clinically volume overloaded on exam. Continue supplemental oxygen. Noted respiratory acidosis on ABG. Suspect this is an sub-acute decompensation given baseline lung disease. Will continue clinically follow.  Cardiovascular: Mild CHF exacerbation on clinical exam. Noted elevated BNP today. Will gently diuresis. Lasix 20 mg IV x1. Then reassess fluid status. Baseline A. fib. INR at 3.3 today. Above goal. We'll continue to follow. No signs of bleeding. Coumadin per pharmacy. Blood pressure stable. Sinus rhythm on EKG.  Continue home medications.  Endocrine: Continue home Synthroid. FEN/GI: ADAT. SLIV.  Prophylaxis: coumadin per pharmacy.  Disposition: pending further evaluation. Code Status: DNR       Doree Albee MD

## 2012-06-28 NOTE — Progress Notes (Signed)
Patient arrived to 60.  Patient oriented to the unit and placed on telemetry.  VS taken and patient assessed.  Admission history completed.  Call bell within reach and bed alarm turned on.

## 2012-06-28 NOTE — Progress Notes (Signed)
Paged Dr. Isidoro Donning, informing her that Dr. Franchot Erichsen had paged and asked that if pt is not being d/c today to change her to inpt.  [name], Charity fundraiser.

## 2012-06-28 NOTE — Progress Notes (Signed)
Patient ID: Julie Rich  female  YQM:[id number]    DOB: 05/30/1921    DOA: 06/27/2012  PCP: Ezequiel Kayser, MD  Assessment/Plan: Principal Problem:  *COPD exacerbation: Still quite wheezing - Continue albuterol and Atrovent nebs q6, on Solu-Medrol (60 mg q6hrs), dulera - Hold Spiriva while inpatient and on Atrovent  - add flutter valve, tesselon, cepacol  Active Problems:  HYPERLIPIDEMIA: Continue Lipitor   HYPERTENSION: BP stable   Atrial fibrillation: On beta blocker, Rythmol and Coumadin   History of STROKE: Patient is on Coumadin per pharmacy INR 3.8  chronic diastolic congestive heart failure: Patient has no symptoms of orthopnea, PND or peripheral edema. Her symptoms are mostly due to COPD exacerbation - 2-D echo done on 06/10/2011 showed an EF of 50-55%, mild AR   DVT Prophylaxis: on coumadin  Code Status:  Disposition: will likely need 1-2 days     Subjective: Coughing with wheezing, no chest pain, afebrile  Objective: Weight change:   Intake/Output Summary (Last 24 hours) at 06/28/12 0943 Last data filed at 06/28/12 0817  Gross per 24 hour  Intake     33 ml  Output    300 ml  Net   -267 ml   Blood pressure 119/49, pulse 65, temperature 98.1 F (36.7 C), temperature source Oral, resp. rate 20, height 5\' 1"  (1.549 m), weight 51.846 kg (114 lb 4.8 oz), SpO2 99.00%.  Physical Exam: General: Alert and awake, oriented x3, not in any acute distress. HEENT: anicteric sclera, pupils reactive to light and accommodation, EOMI CVS: S1-S2 clear, no murmur rubs or gallops Chest: Diffuse wheezing of bilaterally  Abdomen: soft nontender, nondistended, normal bowel sounds, no organomegaly Extremities: no cyanosis, clubbing or edema noted bilaterally Neuro: Cranial nerves II-XII intact, no focal neurological deficits  Lab Results: Basic Metabolic Panel:  Lab 06/28/12 5284 06/27/12 2231  NA 132* 135  K 4.7 4.9  CL 99 100  CO2 26 26  GLUCOSE 167* 132*  BUN 21 21   CREATININE 1.15* 1.25*  CALCIUM 8.3* 8.8  MG -- --  PHOS -- --   Liver Function Tests:  Lab 06/28/12 0500  AST 17  ALT 14  ALKPHOS 73  BILITOT 0.3  PROT 6.1  ALBUMIN 2.9*   No results found for this basename: LIPASE:2,AMYLASE:2 in the last 168 hours No results found for this basename: AMMONIA:2 in the last 168 hours CBC:  Lab 06/28/12 0500 06/27/12 2231  WBC 3.8* 7.0  NEUTROABS -- 5.9  HGB 10.9* 12.1  HCT 32.6* 36.7  MCV 92.1 92.2  PLT 202 217    Studies/Results: Dg Chest Portable 1 View  06/27/2012  *RADIOLOGY REPORT*  Clinical Data: Shortness of breath.  PORTABLE CHEST - 1 VIEW  Comparison: 02/10/2012  Findings: Borderline heart size with normal pulmonary vascularity. The lungs appear hyperinflated with scattered fibrosis consistent with emphysema.  No focal airspace consolidation.  There may be blunting of the left costophrenic angle due to the heart shadow or too small pleural effusion.  No pneumothorax.  Mediastinal contours appear intact.  Calcified and tortuous aorta.  IMPRESSION: Emphysematous changes and scattered fibrosis in the lungs. Possible small left pleural effusion.  No focal consolidation.   Original Report Authenticated By: Burman Nieves, M.D.     Medications: Scheduled Meds:   . albuterol  2.5 mg Nebulization Q6H  . amLODipine  10 mg Oral Daily  . atorvastatin  10 mg Oral QODAY  . doxycycline (VIBRAMYCIN) IV  100 mg Intravenous Q12H  . ipratropium  0.5 mg Nebulization Q6H  . levothyroxine  25 mcg Oral QAC breakfast  . losartan  100 mg Oral Daily  . methylPREDNISolone sodium succinate  125 mg Intravenous Q8H  . metoprolol succinate  100 mg Oral Daily  . mometasone-formoterol  2 puff Inhalation BID  . propafenone  225 mg Oral Q8H  . sodium chloride  3 mL Intravenous Q12H  . temazepam  15 mg Oral QHS  . tiotropium  18 mcg Inhalation Daily  . Warfarin - Pharmacist Dosing Inpatient   Does not apply q1800      LOS: 1 day   Kymari Lollis  M.D. [facility] 06/28/2012, 9:43 AM Pager: [phone]  If 7PM-7AM, please contact night-coverage [email or web address] Password [email or web address]

## 2012-06-28 NOTE — Progress Notes (Signed)
ANTICOAGULATION CONSULT NOTE - Follow Up Consult  Pharmacy Consult for Warfarin Indication: atrial fibrillation  Allergies  Allergen Reactions  . Ciprofloxacin     unknown  . Quinolones     unknown  . Sulfonamide Derivatives     unknown  . Zolpidem Tartrate     Hallucinations    Patient Measurements: Height: 5\' 1"  (154.9 cm) Weight: 114 lb 4.8 oz (51.846 kg) IBW/kg (Calculated) : 47.8   Vital Signs: Temp: 97.6 F (36.4 C) (02/02 0500) Temp src: Oral (02/02 0500) BP: 128/53 mmHg (02/02 0500) Pulse Rate: 70  (02/02 0500)  Labs:  Basename 06/28/12 0500 06/27/12 2231  HGB 10.9* 12.1  HCT 32.6* 36.7  PLT 202 217  APTT -- --  LABPROT 35.8* 31.7*  INR 3.89* 3.30*  HEPARINUNFRC -- --  CREATININE 1.15* 1.25*  CKTOTAL -- --  CKMB -- --  TROPONINI -- --    Estimated Creatinine Clearance: 25.5 ml/min (by C-G formula based on Cr of 1.15).   Medications:  Scheduled:    . albuterol  2.5 mg Nebulization Q6H  . [COMPLETED] albuterol  5 mg Nebulization Once  . [COMPLETED] albuterol  5 mg Nebulization Once  . amLODipine  10 mg Oral Daily  . atorvastatin  10 mg Oral QODAY  . doxycycline (VIBRAMYCIN) IV  100 mg Intravenous Q12H  . [COMPLETED] furosemide  20 mg Intravenous Once  . [COMPLETED] ipratropium  0.5 mg Nebulization Once  . [COMPLETED] ipratropium  0.5 mg Nebulization Once  . ipratropium  0.5 mg Nebulization Q6H  . levothyroxine  25 mcg Oral QAC breakfast  . losartan  100 mg Oral Daily  . [COMPLETED] methylPREDNISolone (SOLU-MEDROL) injection  125 mg Intravenous Once  . methylPREDNISolone sodium succinate  125 mg Intravenous Q8H  . metoprolol succinate  100 mg Oral Daily  . mometasone-formoterol  2 puff Inhalation BID  . propafenone  225 mg Oral Q8H  . sodium chloride  3 mL Intravenous Q12H  . temazepam  15 mg Oral QHS  . tiotropium  18 mcg Inhalation Daily  . Warfarin - Pharmacist Dosing Inpatient   Does not apply q1800  . [DISCONTINUED] albuterol  2.5 mg  Nebulization Q4H    Assessment: 79 y/o F on chronic warfarin for A-fib who presents with COPD exacerbation. INR is SUPRA-therapeutic this AM at 3.89, maybe due to some acute illness. CBC is down, no bleeding reported. SCr 1.15<1.25 with CrCl ~ 25-30.   Goal of Therapy:  INR 2-3 Monitor platelets by anticoagulation protocol: Yes   Plan:  - Hold warfarin tonight and re-assess INR in the AM - Daily PT/INR - Monitor for bleeding  Thank you for the consult,   Abran Duke, PharmD Clinical Pharmacist Phone: [phone] Pager: [phone] 06/28/2012 8:10 AM

## 2012-06-28 NOTE — Progress Notes (Signed)
Pt asking for Resp. Tx.  And wanted o2 checked and 95% on 2L.  Pt made aware, lungs some what diminished.  No acute distress noted.  Resp. Paged.  Pt made aware and verbalized understanding.  [name], Charity fundraiser.

## 2012-06-28 NOTE — Progress Notes (Signed)
Notified Dr. Isidoro Donning that pt's daughter in her room requesting up dates and would like to speak to her.  Awaiting return call.  [name], Charity fundraiser.

## 2012-06-29 DIAGNOSIS — I4891 Unspecified atrial fibrillation: Secondary | ICD-10-CM

## 2012-06-29 DIAGNOSIS — I509 Heart failure, unspecified: Secondary | ICD-10-CM

## 2012-06-29 DIAGNOSIS — J441 Chronic obstructive pulmonary disease with (acute) exacerbation: Principal | ICD-10-CM

## 2012-06-29 DIAGNOSIS — R42 Dizziness and giddiness: Secondary | ICD-10-CM

## 2012-06-29 DIAGNOSIS — I4892 Unspecified atrial flutter: Secondary | ICD-10-CM

## 2012-06-29 DIAGNOSIS — R05 Cough: Secondary | ICD-10-CM

## 2012-06-29 DIAGNOSIS — I5032 Chronic diastolic (congestive) heart failure: Secondary | ICD-10-CM

## 2012-06-29 LAB — BASIC METABOLIC PANEL
CO2: 25 mEq/L (ref 19–32)
Calcium: 9.1 mg/dL (ref 8.4–10.5)
Creatinine, Ser: 1.29 mg/dL — ABNORMAL HIGH (ref 0.50–1.10)

## 2012-06-29 LAB — PROTIME-INR
INR: 4.09 — ABNORMAL HIGH (ref 0.00–1.49)
Prothrombin Time: 37.2 seconds — ABNORMAL HIGH (ref 11.6–15.2)

## 2012-06-29 MED ORDER — METHYLPREDNISOLONE SODIUM SUCC 40 MG IJ SOLR
40.0000 mg | Freq: Three times a day (TID) | INTRAMUSCULAR | Status: DC
  Administered 2012-06-29 – 2012-06-30 (×2): 40 mg via INTRAVENOUS
  Filled 2012-06-29 (×5): qty 1

## 2012-06-29 MED ORDER — HYDROCOD POLST-CHLORPHEN POLST 10-8 MG/5ML PO LQCR
5.0000 mL | Freq: Two times a day (BID) | ORAL | Status: DC
  Administered 2012-06-29 (×2): 5 mL via ORAL
  Filled 2012-06-29 (×2): qty 5

## 2012-06-29 NOTE — Evaluation (Signed)
Physical Therapy Evaluation Patient Details Name: KRYSTN DERMODY MRN: [id number] DOB: 08-May-1922 Today's Date: 06/29/2012 Time: 8657-8469 PT Time Calculation (min): 25 min  PT Assessment / Plan / Recommendation Clinical Impression  Pt admitted with COPD, CHF, AFib. Pt reports they changed her medications today but couldn't state what changed and that she has felt drunk and dizzy all day. Pt with constant report of mild dizziness with sitting and standing but no change with position and no change in HR or oxygen level. Pt will benefit from acute therapy to maximize mobility, gait, safety and function prior to discharge.     PT Assessment  Patient needs continued PT services    Follow Up Recommendations  Home health PT;Supervision for mobility/OOB    Does the patient have the potential to tolerate intense rehabilitation      Barriers to Discharge Decreased caregiver support      Equipment Recommendations  None recommended by PT    Recommendations for Other Services     Frequency Min 3X/week    Precautions / Restrictions Precautions Precautions: Fall   Pertinent Vitals/Pain No pain sats 93-95% throughout on RA even with ambulation HR 66-69 No pain      Mobility  Bed Mobility Bed Mobility: Supine to Sit Supine to Sit: 6: Modified independent (Device/Increase time);HOB flat Transfers Transfers: Sit to Stand;Stand to Sit Sit to Stand: 4: Min guard;From bed;From toilet Stand to Sit: 4: Min guard;To toilet;To chair/3-in-1 Details for Transfer Assistance: guarding for safety due to report of dizziness Ambulation/Gait Ambulation/Gait Assistance: 4: Min guard Ambulation Distance (Feet): 115 Feet (115' then 28' after restroom) Assistive device: None Ambulation/Gait Assistance Details: support at pelvis for stability, pt with 2 partial LOB but able to correct without assist. Slow cautious gait Gait Pattern: Step-through pattern;Decreased stride length Gait velocity:  decreased Stairs: No    Exercises     PT Diagnosis: Difficulty walking  PT Problem List: Decreased activity tolerance;Decreased balance;Decreased knowledge of use of DME PT Treatment Interventions: Gait training;Stair training;DME instruction;Functional mobility training;Therapeutic activities;Therapeutic exercise;Balance training;Patient/family education   PT Goals Acute Rehab PT Goals PT Goal Formulation: With patient Time For Goal Achievement: 07/13/12 Potential to Achieve Goals: Good Pt will go Sit to Stand: with modified independence PT Goal: Sit to Stand - Progress: Goal set today Pt will go Stand to Sit: with modified independence PT Goal: Stand to Sit - Progress: Goal set today Pt will Ambulate: with modified independence;with least restrictive assistive device;>150 feet PT Goal: Ambulate - Progress: Goal set today Pt will Go Up / Down Stairs: 6-9 stairs;with rail(s);with supervision PT Goal: Up/Down Stairs - Progress: Goal set today  Visit Information  Last PT Received On: 06/29/12 Assistance Needed: +1    Subjective Data  Subjective: I feel dizzy Patient Stated Goal: be able to return home   Prior Functioning  Home Living Lives With: Alone Type of Home: House Home Access: Stairs to enter [employer] of Steps: 6 Entrance Stairs-Rails: Right;Left Home Layout: Two level;Able to live on main level with bedroom/bathroom;Laundry or work area in basement [employer] Shower/Tub: Engineer, manufacturing systems: Standard Home Adaptive Equipment: Environmental consultant - rolling;Straight cane Prior Function Level of Independence: Needs assistance Needs Assistance: Scientific laboratory technician Housekeeping: Minimal Able to Take Stairs?: Yes Driving: Yes Vocation: Retired Comments: Pt performs ADLs and ambulation without assist and has assist for laundry, heavier cleaner, grocery shopping Communication Communication: No difficulties    Cognition  Cognition Overall Cognitive  Status: Appears within functional limits for tasks assessed/performed  Arousal/Alertness: Awake/alert Orientation Level: Appears intact for tasks assessed Behavior During Session: [facility] for tasks performed    Extremity/Trunk Assessment Right Upper Extremity Assessment RUE ROM/Strength/Tone: [facility] for tasks assessed Left Upper Extremity Assessment LUE ROM/Strength/Tone: [facility] for tasks assessed Right Lower Extremity Assessment RLE ROM/Strength/Tone: [facility] for tasks assessed Left Lower Extremity Assessment LLE ROM/Strength/Tone: WFL for tasks assessed Trunk Assessment Trunk Assessment: Normal   Balance    End of Session PT - End of Session Equipment Utilized During Treatment: Gait belt Activity Tolerance: Patient tolerated treatment well Patient left: in chair;with call bell/phone within reach Nurse Communication: Mobility status  GP     Delorse Lek 06/29/2012, 4:10 PM  Delaney Meigs, PT [phone]

## 2012-06-29 NOTE — Progress Notes (Signed)
Patient evaluated for community based chronic disease management services with [facility] Care Management Program as a benefit of patient's [employer]. Patient will receive a post discharge transition of care call and will be evaluated for monthly home visits for assessments and disease process education. Spoke with patient at bedside to explain [facility] Care Management services. Patient has accepted services.  She feels that she could benefit from chronic disease management support as she does understand that her challenges are long term.  Patient desires to develop a better understanding of how and when reach out for assistance to manage her symptoms.  She has a pulse oximeter at home.  Left contact information and [facility] literature at bedside.  Made inpatient Case Manager aware that [facility] Care Management following. Of note, [facility] Care Management services does not replace or interfere with any services that are arranged by inpatient case management or social work.  For additional questions or referrals please contact Anibal Henderson BSN RN [facility] Liaison at [phone].

## 2012-06-29 NOTE — Progress Notes (Signed)
ANTICOAGULATION CONSULT NOTE - Follow Up Consult  Pharmacy Consult for Warfarin Indication: atrial fibrillation  Allergies  Allergen Reactions  . Ciprofloxacin     unknown  . Quinolones     unknown  . Sulfonamide Derivatives     unknown  . Zolpidem Tartrate     Hallucinations    Patient Measurements: Height: 5\' 1"  (154.9 cm) Weight: 117 lb 11.6 oz (53.4 kg) IBW/kg (Calculated) : 47.8   Vital Signs: Temp: 97.4 F (36.3 C) (02/03 0600) Temp src: Oral (02/03 0600) BP: 114/52 mmHg (02/03 1009) Pulse Rate: 72  (02/03 1009)  Labs:  Basename 06/29/12 0640 06/28/12 0500 06/27/12 2231  HGB -- 10.9* 12.1  HCT -- 32.6* 36.7  PLT -- 202 217  APTT -- -- --  LABPROT 37.2* 35.8* 31.7*  INR 4.09* 3.89* 3.30*  HEPARINUNFRC -- -- --  CREATININE 1.29* 1.15* 1.25*  CKTOTAL -- -- --  CKMB -- -- --  TROPONINI -- -- --    Estimated Creatinine Clearance: 22.7 ml/min (by C-G formula based on Cr of 1.29).  Assessment: 79 y/o F on chronic warfarin for A-fib who presents with COPD exacerbation. INR remains SUPRA-therapeutic this AM at 4.09 - still continuing upward trend even though doses held past 2 days. ?maybe due to acute illness. CBC is down, no bleeding reported.  Goal of Therapy:  INR 2-3 Monitor platelets by anticoagulation protocol: Yes   Plan:  - Hold warfarin tonight and re-assess INR in the AM - Daily PT/INR - Monitor for bleeding  Thank you for the consult,   Christoper Fabian, PharmD, BCPS Clinical pharmacist, pager [phone] 06/29/2012 10:43 AM

## 2012-06-29 NOTE — Progress Notes (Signed)
Patient ID: Julie Rich  female  NWG:[id number]    DOB: Mar 12, 1922    DOA: 06/27/2012  PCP: Ezequiel Kayser, MD  Assessment/Plan: Principal Problem:  *COPD exacerbation: wheezing improving - Continue albuterol and Atrovent nebs q6, on Solu-Medrol, taper today, transition to prednisone in AM, dulera - Hold Spiriva while inpatient and on Atrovent  - add flutter valve, tesselon, cepacol  Active Problems:  HYPERLIPIDEMIA: Continue Lipitor   HYPERTENSION: BP stable   Atrial fibrillation: On beta blocker, Rythmol and Coumadin - Change the dose of Rythmol (she is on 150 mg TID)   History of STROKE: Patient is on Coumadin per pharmacy INR 4.09, cont to hold coumadin  chronic diastolic congestive heart failure: Patient has no symptoms of orthopnea, PND or peripheral edema. Her symptoms are mostly due to COPD exacerbation - 2-D echo done on 06/10/2011 showed an EF of 50-55%, mild AR   DVT Prophylaxis: on coumadin  Code Status:  Disposition: will likely need 1-2 days, start PT evaluation     Subjective: Wheezing significantly improved today, no chest pain. Tolerating diet, no nausea vomiting  Objective: Weight change: 1.554 kg (3 lb 6.8 oz)  Intake/Output Summary (Last 24 hours) at 06/29/12 1350 Last data filed at 06/29/12 1018  Gross per 24 hour  Intake    966 ml  Output   1200 ml  Net   -234 ml   Blood pressure 114/52, pulse 72, temperature 97.4 F (36.3 C), temperature source Oral, resp. rate 18, height 5\' 1"  (1.549 m), weight 53.4 kg (117 lb 11.6 oz), SpO2 97.00%.  Physical Exam: General: Alert and awake, oriented x3, nad. CVS: S1-S2 clear, no murmur rubs or gallops Chest: Diffuse wheezing significantly improving today Abdomen: soft NT, ND, NBS Extremities: no c/C/E  Lab Results: Basic Metabolic Panel:  Lab 06/29/12 5784 06/28/12 0500  NA 132* 132*  K 4.7 4.7  CL 98 99  CO2 25 26  GLUCOSE 167* 167*  BUN 29* 21  CREATININE 1.29* 1.15*  CALCIUM 9.1 8.3*  MG -- --   PHOS -- --   Liver Function Tests:  Lab 06/28/12 0500  AST 17  ALT 14  ALKPHOS 73  BILITOT 0.3  PROT 6.1  ALBUMIN 2.9*   No results found for this basename: LIPASE:2,AMYLASE:2 in the last 168 hours No results found for this basename: AMMONIA:2 in the last 168 hours CBC:  Lab 06/28/12 0500 06/27/12 2231  WBC 3.8* 7.0  NEUTROABS -- 5.9  HGB 10.9* 12.1  HCT 32.6* 36.7  MCV 92.1 92.2  PLT 202 217    Studies/Results: Dg Chest Portable 1 View  06/27/2012  *RADIOLOGY REPORT*  Clinical Data: Shortness of breath.  PORTABLE CHEST - 1 VIEW  Comparison: 02/10/2012  Findings: Borderline heart size with normal pulmonary vascularity. The lungs appear hyperinflated with scattered fibrosis consistent with emphysema.  No focal airspace consolidation.  There may be blunting of the left costophrenic angle due to the heart shadow or too small pleural effusion.  No pneumothorax.  Mediastinal contours appear intact.  Calcified and tortuous aorta.  IMPRESSION: Emphysematous changes and scattered fibrosis in the lungs. Possible small left pleural effusion.  No focal consolidation.   Original Report Authenticated By: Burman Nieves, M.D.     Medications: Scheduled Meds:    . albuterol  2.5 mg Nebulization Q6H  . amLODipine  10 mg Oral Daily  . atorvastatin  10 mg Oral QODAY  . benzonatate  100 mg Oral TID  . chlorpheniramine-HYDROcodone  5 mL  Oral Q12H  . doxycycline  100 mg Oral Q12H  . ipratropium  0.5 mg Nebulization Q6H  . levothyroxine  25 mcg Oral QAC breakfast  . losartan  100 mg Oral Daily  . methylPREDNISolone (SOLU-MEDROL) injection  40 mg Intravenous Q8H  . metoprolol succinate  100 mg Oral Daily  . mometasone-formoterol  2 puff Inhalation BID  . pantoprazole  40 mg Oral Q0600  . propafenone  150 mg Oral Q8H  . senna-docusate  2 tablet Oral QHS  . sodium chloride  3 mL Intravenous Q12H  . temazepam  15 mg Oral QHS  . Warfarin - Pharmacist Dosing Inpatient   Does not apply q1800       LOS: 2 days   Lynnae Ludemann M.D. [facility] 06/29/2012, 1:50 PM Pager: [phone]  If 7PM-7AM, please contact night-coverage [email or web address] Password [email or web address]

## 2012-06-30 LAB — BASIC METABOLIC PANEL
BUN: 37 mg/dL — ABNORMAL HIGH (ref 6–23)
Chloride: 98 mEq/L (ref 96–112)
Creatinine, Ser: 1.4 mg/dL — ABNORMAL HIGH (ref 0.50–1.10)

## 2012-06-30 LAB — PROTIME-INR: Prothrombin Time: 25.4 seconds — ABNORMAL HIGH (ref 11.6–15.2)

## 2012-06-30 MED ORDER — WARFARIN SODIUM 3 MG PO TABS
3.0000 mg | ORAL_TABLET | Freq: Once | ORAL | Status: AC
  Administered 2012-06-30: 3 mg via ORAL
  Filled 2012-06-30: qty 1

## 2012-06-30 MED ORDER — LEVALBUTEROL HCL 0.63 MG/3ML IN NEBU
0.6300 mg | INHALATION_SOLUTION | Freq: Four times a day (QID) | RESPIRATORY_TRACT | Status: DC
  Administered 2012-06-30 – 2012-07-02 (×8): 0.63 mg via RESPIRATORY_TRACT
  Filled 2012-06-30 (×12): qty 3

## 2012-06-30 MED ORDER — HYDROCOD POLST-CHLORPHEN POLST 10-8 MG/5ML PO LQCR
5.0000 mL | Freq: Two times a day (BID) | ORAL | Status: DC | PRN
  Administered 2012-06-30: 5 mL via ORAL
  Filled 2012-06-30 (×2): qty 5

## 2012-06-30 MED ORDER — PREDNISONE 20 MG PO TABS
40.0000 mg | ORAL_TABLET | Freq: Every day | ORAL | Status: DC
  Administered 2012-06-30 – 2012-07-02 (×3): 40 mg via ORAL
  Filled 2012-06-30 (×4): qty 2

## 2012-06-30 MED ORDER — IPRATROPIUM BROMIDE 0.02 % IN SOLN
0.5000 mg | Freq: Four times a day (QID) | RESPIRATORY_TRACT | Status: DC
  Administered 2012-06-30 – 2012-07-02 (×8): 0.5 mg via RESPIRATORY_TRACT
  Filled 2012-06-30 (×8): qty 2.5

## 2012-06-30 NOTE — Progress Notes (Signed)
Event: Notified by RN that charge RN went into pt's room and found her sitting on the floor alert. Though pt stated she did not actually fall, nursing has to treat it has an unwitnessed fall. NP to bedside. Subjective: Pt reports she was returning to her bed when she began to slip so she lowered herself to the floor and just sat down. She denies any injury. Denies dizziness, CP or other symptoms prior to fall. Objective: Julie Rich is a 79 y.o. year old female with significant past medical history of CHF, A. fib, airway obstruction admitted 06/27/2012  with increased shortness of breath cough and wheezing x 3 days. At bedside pt noted resting in no acute distress. She is arouses easily.  She is noted alert and oriented x 3.  PEARRL, head and face atraumatic. MAEx4. Grips equal bil. CN II-XII appear grossly intact. No bony TTP to c-spine, t-spine or l-spine. No TTP to bil hips . Pelvis is stable. No chest wall TTP. BBS diminished w/o wheezes or crackles, abd soft, non-distended w/ normal bs.  No obvious injuries. Current VS, 108/64, T-97.4, P-88, R-18 w/ 02 sats of 96% on R/A. Assessment/Plan: 1. Unwitnessed fall: I do not believe this was a complete fall. Pt is  AA&O x 3 and states she lowed herself to floor to prevent falling. No obvious injuries. VSS. No indication for imaging. Will continue to monitor closely.  Leanne Chang, NP-C [facility] Pager [phone]

## 2012-06-30 NOTE — Progress Notes (Signed)
ANTICOAGULATION CONSULT NOTE - Follow Up Consult  Pharmacy Consult for Warfarin Indication: atrial fibrillation  Allergies  Allergen Reactions  . Ciprofloxacin     unknown  . Quinolones     unknown  . Sulfonamide Derivatives     unknown  . Zolpidem Tartrate     Hallucinations    Patient Measurements: Height: 5\' 1"  (154.9 cm) Weight: 115 lb 1.3 oz (52.2 kg) IBW/kg (Calculated) : 47.8   Vital Signs: Temp: 98.6 F (37 C) (02/04 0544) Temp src: Oral (02/04 0544) BP: 100/77 mmHg (02/04 0939) Pulse Rate: 101  (02/04 0939)  Labs:  Basename 06/30/12 0515 06/29/12 0640 06/28/12 0500 06/27/12 2231  HGB -- -- 10.9* 12.1  HCT -- -- 32.6* 36.7  PLT -- -- 202 217  APTT -- -- -- --  LABPROT 25.4* 37.2* 35.8* --  INR 2.44* 4.09* 3.89* --  HEPARINUNFRC -- -- -- --  CREATININE 1.40* 1.29* 1.15* --  CKTOTAL -- -- -- --  CKMB -- -- -- --  TROPONINI -- -- -- --    Estimated Creatinine Clearance: 21 ml/min (by C-G formula based on Cr of 1.4).  Assessment: 79 y/o F on chronic warfarin for A-fib who presents with COPD exacerbation. INR now back to therapeutic range after dose held x 3 days. Last Hgb 2/2 down, but no bleeding reported.  Goal of Therapy:  INR 2-3 Monitor platelets by anticoagulation protocol: Yes   Plan:  - Coumadin 3 mg po today  - Daily PT/INR - Monitor for bleeding  Thank you for the consult,   Christoper Fabian, PharmD, BCPS Clinical pharmacist, pager [phone] 06/30/2012 10:09 AM

## 2012-06-30 NOTE — Progress Notes (Signed)
Followed up with patient at bedside regarding [facility] services.  Patient is going to receive home health services from [facility] at discharge.  She feels that she will be able to manage without difficulty with only home health services.  Writer emphasized the difference between care management and home health services.  Patient verbalized understanding and maintains that she feels confident to self care.  [facility] literature for contact provided on previous visit.

## 2012-06-30 NOTE — Progress Notes (Signed)
Spoke with Natalia Leatherwood in reference to increase and decrease in heart rate and EKG showing  ST with 2nd degree AV Block. Pt asymptomatic, B/P 133/59, P51-110,  O2 sats 93% 2L, will continue to monitor, no new orders given.

## 2012-06-30 NOTE — Progress Notes (Signed)
Patient ID: Julie Rich  female  ZOX:[id number]    DOB: 09-24-1921    DOA: 06/27/2012  PCP: Ezequiel Kayser, MD  Assessment/Plan: Principal Problem:  *COPD exacerbation: wheezing significantly improved today - Continue albuterol and Atrovent nebs q6,  - DC'd Solu-Medrol, start prednisone, continue dulera - Hold Spiriva while inpatient and on Atrovent  -Continue flutter valve, tesselon, cepacol  Active Problems:  HYPERLIPIDEMIA: Continue Lipitor   HYPERTENSION: BP stable   Atrial fibrillation: On beta blocker, Rythmol and Coumadin - Overnight had RVR, EKG shows ST, 2ndAVB, echo pending, i will DC albuterol and place on xopenex and atrovent - Changed the dose of Rythmol (she is on 150 mg TID)   History of STROKE: Patient is on Coumadin per pharmacy   chronic diastolic congestive heart failure: Patient has no symptoms of orthopnea, PND or peripheral edema. Her symptoms are mostly due to COPD exacerbation - 2-D echo 06/10/2011: EF of 50-55%, mild AR   DVT Prophylaxis: on coumadin  Code Status:  Disposition: Discussed in detail with the patient's daughter over the phone, arranged a home health PT, RN, home health aide. DC in a.m., arrange pulmonology followup within 7-10 days (patient's pulmonologist is Dr. Franz Dell).     Subjective: Wheezing significantly improved today, somewhat anxious and shaky today  Objective: Weight change: -1.2 kg (-2 lb 10.3 oz)  Intake/Output Summary (Last 24 hours) at 06/30/12 1120 Last data filed at 06/30/12 0833  Gross per 24 hour  Intake    600 ml  Output    700 ml  Net   -100 ml   Blood pressure 100/77, pulse 101, temperature 98.2 F (36.8 C), temperature source Oral, resp. rate 98, height 5\' 1"  (1.549 m), weight 52.2 kg (115 lb 1.3 oz), SpO2 96.00%.  Physical Exam: General: Alert and awake, oriented x3, nad. CVS: S1-S2 clear, no murmur rubs or gallops Chest: Diffuse wheezing much better improved from yesterday Abdomen: soft NT, ND,  NBS Extremities: no c/C/E  Lab Results: Basic Metabolic Panel:  Lab 06/30/12 8119 06/29/12 0640  NA 134* 132*  K 4.7 4.7  CL 98 98  CO2 29 25  GLUCOSE 152* 167*  BUN 37* 29*  CREATININE 1.40* 1.29*  CALCIUM 8.8 9.1  MG -- --  PHOS -- --   Liver Function Tests:  Lab 06/28/12 0500  AST 17  ALT 14  ALKPHOS 73  BILITOT 0.3  PROT 6.1  ALBUMIN 2.9*   CBC:  Lab 06/28/12 0500 06/27/12 2231  WBC 3.8* 7.0  NEUTROABS -- 5.9  HGB 10.9* 12.1  HCT 32.6* 36.7  MCV 92.1 92.2  PLT 202 217    Studies/Results: Dg Chest Portable 1 View  06/27/2012  *RADIOLOGY REPORT*  Clinical Data: Shortness of breath.  PORTABLE CHEST - 1 VIEW  Comparison: 02/10/2012  Findings: Borderline heart size with normal pulmonary vascularity. The lungs appear hyperinflated with scattered fibrosis consistent with emphysema.  No focal airspace consolidation.  There may be blunting of the left costophrenic angle due to the heart shadow or too small pleural effusion.  No pneumothorax.  Mediastinal contours appear intact.  Calcified and tortuous aorta.  IMPRESSION: Emphysematous changes and scattered fibrosis in the lungs. Possible small left pleural effusion.  No focal consolidation.   Original Report Authenticated By: Burman Nieves, M.D.     Medications: Scheduled Meds:    . albuterol  2.5 mg Nebulization Q6H  . amLODipine  10 mg Oral Daily  . atorvastatin  10 mg Oral QODAY  . benzonatate  100 mg Oral TID  . doxycycline  100 mg Oral Q12H  . ipratropium  0.5 mg Nebulization Q6H  . levothyroxine  25 mcg Oral QAC breakfast  . losartan  100 mg Oral Daily  . metoprolol succinate  100 mg Oral Daily  . mometasone-formoterol  2 puff Inhalation BID  . pantoprazole  40 mg Oral Q0600  . predniSONE  40 mg Oral Q breakfast  . propafenone  150 mg Oral Q8H  . senna-docusate  2 tablet Oral QHS  . sodium chloride  3 mL Intravenous Q12H  . temazepam  15 mg Oral QHS  . warfarin  3 mg Oral ONCE-1800  . Warfarin -  Pharmacist Dosing Inpatient   Does not apply q1800      LOS: 3 days   RAI,RIPUDEEP M.D. [facility] 06/30/2012, 11:20 AM Pager: [phone]  If 7PM-7AM, please contact night-coverage [email or web address] Password [email or web address]

## 2012-06-30 NOTE — Progress Notes (Signed)
UR Chart Review Completed  

## 2012-06-30 NOTE — Progress Notes (Signed)
Physical Therapy Treatment Patient Details Name: Julie Rich MRN: [id number] DOB: 11/09/1921 Today's Date: 06/30/2012 Time: 0902-0928 PT Time Calculation (min): 26 min  PT Assessment / Plan / Recommendation Comments on Treatment Session  Pt admitted with COPD exacerbation, AFib and CHF. Pt continues to report dizziness throughout tx today without change based on position or activity and requires minguard assist for mobility due to partial LOB even with DME. Pt states she has not been able to arrange for anyone to stay with her only to pop in and check on her. Pt educated for current deficits in function and concern over returning home alone. Will continue to follow to maximize mobility, gait and function prior to discharge.     Follow Up Recommendations  Home health PT;Supervision for mobility/OOB;SNF, if pt able to meet goals and achieve mod I then return home otherwise ST-SNF may be necessary for safety and balance     Does the patient have the potential to tolerate intense rehabilitation     Barriers to Discharge        Equipment Recommendations  Rolling walker with 5" wheels (short RW, pt current is StdW that belonged to spouse)    Recommendations for Other Services    Frequency     Plan Discharge plan needs to be updated;Frequency remains appropriate    Precautions / Restrictions Precautions Precautions: Fall   Pertinent Vitals/Pain No pain just dizziness per pt O2 92% on RA with activity HR 98-120    Mobility  Bed Mobility Bed Mobility: Not assessed Transfers Sit to Stand: From bed;From toilet;4: Min guard Stand to Sit: 4: Min guard;To chair/3-in-1;To toilet Details for Transfer Assistance: guarding for safety due to LOB with mobility, x 2 transfers Ambulation/Gait Ambulation/Gait Assistance: 4: Min guard Ambulation Distance (Feet): 200 Feet Assistive device: Rolling walker Ambulation/Gait Assistance Details: cueing for position in RW and directional cues, continued  slow cautious gait with RW with 2 partial LOB Gait Pattern: Step-through pattern;Decreased stride length Gait velocity: decreased Stairs: Yes Stairs Assistance: 4: Min guard Stair Management Technique: One rail Left Number of Stairs: 6     Exercises General Exercises - Lower Extremity Long Arc Quad: AROM;Both;20 reps;Seated Hip Flexion/Marching: AROM;Both;20 reps;Seated   PT Diagnosis:    PT Problem List:   PT Treatment Interventions:     PT Goals Acute Rehab PT Goals PT Goal: Sit to Stand - Progress: Progressing toward goal PT Goal: Stand to Sit - Progress: Progressing toward goal PT Goal: Ambulate - Progress: Progressing toward goal PT Goal: Up/Down Stairs - Progress: Progressing toward goal  Visit Information  Last PT Received On: 06/30/12 Assistance Needed: +1    Subjective Data  Subjective: I still feel dizzy   Cognition  Cognition Overall Cognitive Status: Impaired Arousal/Alertness: Awake/alert Orientation Level: Disoriented to;Time Behavior During Session: WFL for tasks performed    Balance     End of Session PT - End of Session Activity Tolerance: Patient tolerated treatment well Patient left: in chair;with call bell/phone within reach Nurse Communication: Mobility status   GP     Delorse Lek 06/30/2012, 11:41 AM Delaney Meigs, PT [phone]

## 2012-06-30 NOTE — Care Management Note (Signed)
    Page 1 of 2   06/30/2012     4:15:16 PM   CARE MANAGEMENT NOTE 06/30/2012  Patient:  Julie Rich, Julie Rich   Account Number:  [id number]  Date Initiated:  06/30/2012  Documentation initiated by:  Donn Pierini  Subjective/Objective Assessment:   Pt admitted with COPD exacerbation     Action/Plan:   PTA pt lived at home has RW at home   Anticipated DC Date:  07/01/2012   Anticipated DC Plan:  HOME W HOME HEALTH SERVICES      DC Planning Services  CM consult      [facility]  HOME HEALTH   Choice offered to / List presented to:  C-1 Patient        HH arranged  HH-1 RN  HH-2 PT  HH-4 NURSE'S AIDE      HH agency  [facility]   Status of service:  Completed, signed off Medicare Important Message given?   (If response is "NO", the following Medicare IM given date fields will be blank) Date Medicare IM given:   Date Additional Medicare IM given:    Discharge Disposition:    Per UR Regulation:  Reviewed for med. necessity/level of care/duration of stay  If discussed at Long Length of Stay Meetings, dates discussed:    Comments:  06/30/12- 1600- Donn Pierini RN, BSN [phone] Orders for [facility]- RN/PT/aide- pt for possible d/c in am- spoke with pt at bedside- per conversation pt states that she has had HH services in the past-list of [facility] agencies for [facility] reviewed- pt says she has used [facility] in past and would like to use them again for [facility] services. Pt already has RW at home and reports that she does not feel like she needs any other DME at this time. Referral for HH-RN/PT/aide called to Lupita Leash with [facility] services to begin within 24-48 hr. post discharge.

## 2012-07-01 ENCOUNTER — Inpatient Hospital Stay (HOSPITAL_COMMUNITY): Payer: Medicare Other

## 2012-07-01 DIAGNOSIS — I059 Rheumatic mitral valve disease, unspecified: Secondary | ICD-10-CM

## 2012-07-01 LAB — PROTIME-INR
INR: 1.72 — ABNORMAL HIGH (ref 0.00–1.49)
Prothrombin Time: 19.6 seconds — ABNORMAL HIGH (ref 11.6–15.2)

## 2012-07-01 LAB — BASIC METABOLIC PANEL
CO2: 27 mEq/L (ref 19–32)
Calcium: 8.4 mg/dL (ref 8.4–10.5)
Creatinine, Ser: 1.43 mg/dL — ABNORMAL HIGH (ref 0.50–1.10)
GFR calc Af Amer: 37 mL/min — ABNORMAL LOW (ref >90)
GFR calc non Af Amer: 32 mL/min — ABNORMAL LOW (ref >90)
Sodium: 134 mEq/L — ABNORMAL LOW (ref 135–145)

## 2012-07-01 MED ORDER — GUAIFENESIN-DM 100-10 MG/5ML PO SYRP
5.0000 mL | ORAL_SOLUTION | ORAL | Status: DC | PRN
  Administered 2012-07-01 – 2012-07-02 (×2): 5 mL via ORAL
  Filled 2012-07-01 (×2): qty 5

## 2012-07-01 MED ORDER — HEPARIN (PORCINE) IN NACL 100-0.45 UNIT/ML-% IJ SOLN
750.0000 [IU]/h | INTRAMUSCULAR | Status: DC
  Administered 2012-07-01: 600 [IU]/h via INTRAVENOUS
  Administered 2012-07-02: 750 [IU]/h via INTRAVENOUS
  Filled 2012-07-01: qty 250

## 2012-07-01 MED ORDER — NYSTATIN 100000 UNIT/ML MT SUSP
5.0000 mL | Freq: Four times a day (QID) | OROMUCOSAL | Status: DC
  Administered 2012-07-02 – 2012-07-03 (×7): 500000 [IU] via ORAL
  Filled 2012-07-01 (×9): qty 5

## 2012-07-01 MED ORDER — WARFARIN SODIUM 4 MG PO TABS
4.0000 mg | ORAL_TABLET | Freq: Once | ORAL | Status: AC
  Administered 2012-07-01: 4 mg via ORAL
  Filled 2012-07-01: qty 1

## 2012-07-01 MED ORDER — SODIUM CHLORIDE 0.9 % IV SOLN
Freq: Once | INTRAVENOUS | Status: AC
  Administered 2012-07-01: 500 mL via INTRAVENOUS

## 2012-07-01 NOTE — Progress Notes (Signed)
ANTICOAGULATION CONSULT NOTE - Follow Up Consult  Pharmacy Consult for Warfarin Indication: atrial fibrillation  Allergies  Allergen Reactions  . Ciprofloxacin     unknown  . Quinolones     unknown  . Sulfonamide Derivatives     unknown  . Zolpidem Tartrate     Hallucinations    Patient Measurements: Height: 5\' 1"  (154.9 cm) Weight: 113 lb 15.7 oz (51.7 kg) IBW/kg (Calculated) : 47.8   Vital Signs: Temp: 98 F (36.7 C) (02/05 0500) Temp src: Oral (02/05 0500) BP: 77/45 mmHg (02/05 0921) Pulse Rate: 94  (02/05 0921)  Labs:  Basename 07/01/12 0455 06/30/12 0515 06/29/12 0640  HGB -- -- --  HCT -- -- --  PLT -- -- --  APTT -- -- --  LABPROT 19.6* 25.4* 37.2*  INR 1.72* 2.44* 4.09*  HEPARINUNFRC -- -- --  CREATININE 1.43* 1.40* 1.29*  CKTOTAL -- -- --  CKMB -- -- --  TROPONINI -- -- --    Estimated Creatinine Clearance: 20.5 ml/min (by C-G formula based on Cr of 1.43).  Assessment: 79 y/o F on chronic warfarin for A-fib who presents with COPD exacerbation. INR now subtherapeutic after dose held x 3 days due to supratherapeutic INR. Last Hgb 2/2 down, but no bleeding reported.  Goal of Therapy:  INR 2-3 Monitor platelets by anticoagulation protocol: Yes   Plan:  - Coumadin 4 mg po today. (If pt d/c, can restart home dose upon d/c. Consider recheck INR in 2-3 days.) - Daily PT/INR - Monitor for bleeding  Thank you for the consult,   Christoper Fabian, PharmD, BCPS Clinical pharmacist, pager [phone] 07/01/2012 9:57 AM

## 2012-07-01 NOTE — Progress Notes (Signed)
When assisting patient to bathroom this morning, patient had a small dizzy spell and therefore used bedside commode for it was closer to patient.  Once patient sat down on commode, vital signs were taken and soft but within normal limits. After sitting on commode for a few miniutes, patient stood up to get cleaned and wanted to wash hands. Blood pressure taken after washing hands while standing and BP dropped to systolic to 79/40( see documentation) Notified attending MD and orders given to give bolus of normal saline 500cc and orthostatic BPx1.  Both were done and blood pressure is now 104/53. This morning did advise patient to not get up without assistance and patient understood. Will Continue to monitor to end of shift.

## 2012-07-01 NOTE — Progress Notes (Signed)
Spoke with daughter this afternoon and wanted to ask attending MD question regarding the care of patient - her mother.While nurse and daughter in room, doctor called and spoke with daughter. Daughter feels that she has a better understanding of the care of her mother who is the patient and understands that at this time her mother will not be going today and probably not tomorrow as well. Will continue to monitor patient to end of the shift.

## 2012-07-01 NOTE — Progress Notes (Signed)
ANTICOAGULATION INITIAL NOTE - initial Consult  Pharmacy Consult for Heparin Indication: atrial fibrillation  Allergies  Allergen Reactions  . Ciprofloxacin     unknown  . Quinolones     unknown  . Sulfonamide Derivatives     unknown  . Zolpidem Tartrate     Hallucinations    Patient Measurements: Height: 5\' 1"  (154.9 cm) Weight: 113 lb 15.7 oz (51.7 kg) IBW/kg (Calculated) : 47.8   Vital Signs: Temp: 98.5 F (36.9 C) (02/05 1339) Temp src: Axillary (02/05 1339) BP: 104/53 mmHg (02/05 1339) Pulse Rate: 105  (02/05 1339)  Labs:  Alvira Philips 07/01/12 0455 06/30/12 0515 06/29/12 0640  HGB -- -- --  HCT -- -- --  PLT -- -- --  APTT -- -- --  LABPROT 19.6* 25.4* 37.2*  INR 1.72* 2.44* 4.09*  HEPARINUNFRC -- -- --  CREATININE 1.43* 1.40* 1.29*  CKTOTAL -- -- --  CKMB -- -- --  TROPONINI -- -- --    Estimated Creatinine Clearance: 20.5 ml/min (by C-G formula based on Cr of 1.43).  Assessment: 79 y/o F on chronic warfarin for A-fib. INR now subtherapeutic after dose held x 3 days due to supratherapeutic INR.  Patient now to start heparin to cover for subtherapeutic INR.    Goal of Therapy:  Heparin level 0.3-0.7 Monitor platelets by anticoagulation protocol: Yes   Plan:  Start heparin at 600 units/hr.   F/u heparin level in am.  Daily heparin level/cbc. Monitor for bleeding.   Wendie Simmer, PharmD, BCPS Clinical Pharmacist  Pager: [phone]

## 2012-07-01 NOTE — Progress Notes (Signed)
Informed by monitor tech that Julie Rich fell. Immediately went to patient's room. Press photographer and another nurse at bedside assisting the patient. According to the patient, she just slip so she tried lowering her body and sat on the floor. It was not actually witnessed by anyone.  Assisted patient to go back to bed. Patient is alert and oriented. Vital signs were taken and were stable. No complaints of pain. Paged MD oncall about the incident. Placed patient on bed alarm and reiterated to call every time she needs assistance. Monitored patient more frequently. Post fall huddle was done with the staff.

## 2012-07-01 NOTE — Progress Notes (Addendum)
Patient ID: Julie Rich  female  ZOX:[id number]    DOB: 1922/01/02    DOA: 06/27/2012  PCP: Ezequiel Kayser, MD  Assessment/Plan: Principal Problem:  *COPD exacerbation: wheezing significantly improved. - Continue albuterol and Atrovent nebs q6,  - DC'd Solu-Medrol, start prednisone, continue dulera - Hold Spiriva while inpatient and on Atrovent  -Continue flutter valve, tesselon, cepacol  Active Problems:  HYPERLIPIDEMIA: Continue Lipitor   Hypotension: holding norvasc and the rest of the blood pressure medications. We will give her a bolus of 500 ml and recheck blood pressure in one hour.    Atrial fibrillation: On beta blocker, Rythmol and Coumadin -  EKG shows ST, 2ndAVB, echo pending,  on xopenex and atrovent - Changed the dose of Rythmol (she is on 150 mg TID)   History of STROKE: Patient is on Coumadin per pharmacy   chronic diastolic congestive heart failure: Patient has no symptoms of orthopnea, PND or peripheral edema. Her symptoms are mostly due to COPD exacerbation - 2-D echo 06/10/2011: EF of 50-55%, mild AR   DVT Prophylaxis: on coumadin  Code Status:  Disposition: Discussed in detail with the patient's daughter over the phone, arranged a home health PT, RN, home health aide. arrange pulmonology followup within 7-10 days (patient's pulmonologist is Dr. Franz Dell).     Subjective: Feeling dizzy last night and this am. Possibly from hypotension.   Objective: Weight change: -0.5 kg (-1 lb 1.6 oz)  Intake/Output Summary (Last 24 hours) at 07/01/12 1153 Last data filed at 07/01/12 0900  Gross per 24 hour  Intake    600 ml  Output    200 ml  Net    400 ml   Blood pressure 77/45, pulse 94, temperature 98 F (36.7 C), temperature source Oral, resp. rate 18, height 5\' 1"  (1.549 m), weight 51.7 kg (113 lb 15.7 oz), SpO2 98.00%.  Physical Exam: General: Alert and awake, oriented x3, nad. CVS: S1-S2 clear, no murmur rubs or gallops Chest: Diffuse wheezing much better  improved from yesterday Abdomen: soft NT, ND, NBS Extremities: no c/C/E  Lab Results: Basic Metabolic Panel:  Lab 07/01/12 8119 06/30/12 0515  NA 134* 134*  K 4.9 4.7  CL 101 98  CO2 27 29  GLUCOSE 125* 152*  BUN 47* 37*  CREATININE 1.43* 1.40*  CALCIUM 8.4 8.8  MG -- --  PHOS -- --   Liver Function Tests:  Lab 06/28/12 0500  AST 17  ALT 14  ALKPHOS 73  BILITOT 0.3  PROT 6.1  ALBUMIN 2.9*   CBC:  Lab 06/28/12 0500 06/27/12 2231  WBC 3.8* 7.0  NEUTROABS -- 5.9  HGB 10.9* 12.1  HCT 32.6* 36.7  MCV 92.1 92.2  PLT 202 217    Studies/Results: Dg Chest Portable 1 View  06/27/2012  *RADIOLOGY REPORT*  Clinical Data: Shortness of breath.  PORTABLE CHEST - 1 VIEW  Comparison: 02/10/2012  Findings: Borderline heart size with normal pulmonary vascularity. The lungs appear hyperinflated with scattered fibrosis consistent with emphysema.  No focal airspace consolidation.  There may be blunting of the left costophrenic angle due to the heart shadow or too small pleural effusion.  No pneumothorax.  Mediastinal contours appear intact.  Calcified and tortuous aorta.  IMPRESSION: Emphysematous changes and scattered fibrosis in the lungs. Possible small left pleural effusion.  No focal consolidation.   Original Report Authenticated By: Burman Nieves, M.D.     Medications: Scheduled Meds:    . atorvastatin  10 mg Oral QODAY  .  benzonatate  100 mg Oral TID  . doxycycline  100 mg Oral Q12H  . levalbuterol  0.63 mg Nebulization Q6H   And  . ipratropium  0.5 mg Nebulization Q6H  . levothyroxine  25 mcg Oral QAC breakfast  . losartan  100 mg Oral Daily  . metoprolol succinate  100 mg Oral Daily  . mometasone-formoterol  2 puff Inhalation BID  . pantoprazole  40 mg Oral Q0600  . predniSONE  40 mg Oral Q breakfast  . propafenone  150 mg Oral Q8H  . senna-docusate  2 tablet Oral QHS  . sodium chloride  3 mL Intravenous Q12H  . temazepam  15 mg Oral QHS  . warfarin  4 mg Oral  ONCE-1800  . Warfarin - Pharmacist Dosing Inpatient   Does not apply q1800      LOS: 4 days   Gracey Tolle M.D. [facility] 07/01/2012, 11:53 AM Pager: [phone]  If 7PM-7AM, please contact night-coverage [email or web address] Password [email or web address]  Addendum: spoke to her daughter in detail of the results of the Echo. She wants to defer the TEE and watch her on heparin and coumadin. We will [facility] cardiology for further recommendations.

## 2012-07-01 NOTE — Progress Notes (Signed)
  Echocardiogram 2D Echocardiogram has been performed.  Georgian Co 07/01/2012, 6:09 PM

## 2012-07-02 ENCOUNTER — Telehealth: Payer: Self-pay | Admitting: Internal Medicine

## 2012-07-02 DIAGNOSIS — I951 Orthostatic hypotension: Secondary | ICD-10-CM

## 2012-07-02 LAB — CBC
MCH: 30.6 pg (ref 26.0–34.0)
MCHC: 33.8 g/dL (ref 30.0–36.0)
MCV: 90.7 fL (ref 78.0–100.0)
Platelets: 273 10*3/uL (ref 150–400)
RBC: 4.31 MIL/uL (ref 3.87–5.11)
RDW: 13.6 % (ref 11.5–15.5)

## 2012-07-02 LAB — PROTIME-INR
INR: 2.06 — ABNORMAL HIGH (ref 0.00–1.49)
Prothrombin Time: 22.4 seconds — ABNORMAL HIGH (ref 11.6–15.2)

## 2012-07-02 MED ORDER — FLUCONAZOLE 200 MG PO TABS
200.0000 mg | ORAL_TABLET | Freq: Once | ORAL | Status: AC
  Administered 2012-07-02: 200 mg via ORAL
  Filled 2012-07-02: qty 1

## 2012-07-02 MED ORDER — PREDNISONE 20 MG PO TABS
30.0000 mg | ORAL_TABLET | Freq: Every day | ORAL | Status: DC
  Administered 2012-07-03: 30 mg via ORAL
  Filled 2012-07-02 (×2): qty 1

## 2012-07-02 MED ORDER — LEVALBUTEROL HCL 0.63 MG/3ML IN NEBU
0.6300 mg | INHALATION_SOLUTION | Freq: Three times a day (TID) | RESPIRATORY_TRACT | Status: DC
  Filled 2012-07-02: qty 3

## 2012-07-02 MED ORDER — WARFARIN SODIUM 4 MG PO TABS
4.0000 mg | ORAL_TABLET | Freq: Once | ORAL | Status: AC
  Administered 2012-07-02: 4 mg via ORAL
  Filled 2012-07-02: qty 1

## 2012-07-02 MED ORDER — IPRATROPIUM BROMIDE 0.02 % IN SOLN: 0.5000 mg | Freq: Four times a day (QID) | RESPIRATORY_TRACT | Status: DC

## 2012-07-02 MED ORDER — LEVALBUTEROL HCL 0.63 MG/3ML IN NEBU
0.6300 mg | INHALATION_SOLUTION | Freq: Three times a day (TID) | RESPIRATORY_TRACT | Status: DC
  Administered 2012-07-03 (×2): 0.63 mg via RESPIRATORY_TRACT
  Filled 2012-07-02 (×4): qty 3

## 2012-07-02 MED ORDER — IPRATROPIUM BROMIDE 0.02 % IN SOLN
0.5000 mg | Freq: Three times a day (TID) | RESPIRATORY_TRACT | Status: DC
  Administered 2012-07-03 (×2): 0.5 mg via RESPIRATORY_TRACT
  Filled 2012-07-02 (×2): qty 2.5

## 2012-07-02 NOTE — Progress Notes (Signed)
ANTICOAGULATION CONSULT NOTE - Follow Up Consult  Pharmacy Consult for heparin Indication: atrial fibrillation  Labs:  Basename 07/02/12 0555 07/01/12 0455 06/30/12 0515 06/29/12 0640  HGB -- -- -- --  HCT -- -- -- --  PLT -- -- -- --  APTT -- -- -- --  LABPROT 22.4* 19.6* 25.4* --  INR 2.06* 1.72* 2.44* --  HEPARINUNFRC <0.10* -- -- --  CREATININE -- 1.43* 1.40* 1.29*  CKTOTAL -- -- -- --  CKMB -- -- -- --  TROPONINI -- -- -- --    Assessment: 79yo female undetectable on heparin with initial dosing for Afib with subtherapeutic INR; per RN IV line had been leaking and required new line so unclear whether this level is accurate; INR now >2.  Goal of Therapy:  Heparin level 0.3-0.7 units/ml   Plan:  Will increase gtt by 3 units/kg/hr to 750 units/hr and check level in 8hr; f/u whether heparin still required given INR.  [name] PharmD BCPS 07/02/2012,6:34 AM

## 2012-07-02 NOTE — Telephone Encounter (Signed)
Cannot officially see in hospital unless requested by primary service but will try to get by to say hello

## 2012-07-02 NOTE — Progress Notes (Signed)
Patient ID: Julie Rich  female  XBJ:[id number]    DOB: 1921-12-28    DOA: 06/27/2012  PCP: Ezequiel Kayser, MD  Assessment/Plan: Principal Problem:  *COPD exacerbation: wheezing significantly improved. - Continue albuterol and Atrovent nebs q6,  - DC'd Solu-Medrol, start prednisone, continue dulera - Hold Spiriva while inpatient and on Atrovent  -Continue flutter valve, robitussin, cepacol  Active Problems:  HYPERLIPIDEMIA: Continue Lipitor   Hypotension: resolved.    Atrial fibrillation: On beta blocker, Rythmol and Coumadin -  EKG shows ST, 2ndAVB, echo pending,  on xopenex and atrovent - Changed the dose of Rythmol (she is on 150 mg TID). - she remains in A FIB. INR therapeutic.    History of STROKE: Patient is on Coumadin per pharmacy   chronic diastolic congestive heart failure: Patient has no symptoms of orthopnea, PND or peripheral edema. Her symptoms are mostly due to COPD exacerbation - 2-D echo 06/10/2011: EF of 50-55%, mild AR.  Repeat echo shows a hypodensity in LAA, and she was started on IV heparin on 2/5 and discontinued on 2/6 as her INR became therapeutic.  Oral thrush: pain improved. on nystatin suspension. Ordered one dose of diflucan.    DVT Prophylaxis: on coumadin  Code Status:DNR.  Disposition: patient's daughter wants to see if she is eligible for snf rehabilitation.     Subjective: Feeling dizzy last night and this am. Possibly from hypotension.   Objective: Weight change: -0.9 kg (-1 lb 15.8 oz)  Intake/Output Summary (Last 24 hours) at 07/02/12 1656 Last data filed at 07/02/12 1300  Gross per 24 hour  Intake  396.9 ml  Output   1301 ml  Net -904.1 ml   Blood pressure 105/68, pulse 93, temperature 98.4 F (36.9 C), temperature source Oral, resp. rate 18, height 5\' 1"  (1.549 m), weight 50.8 kg (111 lb 15.9 oz), SpO2 94.00%.  Physical Exam: General: Alert and awake, oriented x3, nad. CVS: S1-S2 clear, no murmur rubs or gallops Chest: no  wheezing good air entry b/l Abdomen: soft NT, ND, NBS Extremities: no c/C/E  Lab Results: Basic Metabolic Panel:  Lab 07/01/12 3086 06/30/12 0515  NA 134* 134*  K 4.9 4.7  CL 101 98  CO2 27 29  GLUCOSE 125* 152*  BUN 47* 37*  CREATININE 1.43* 1.40*  CALCIUM 8.4 8.8  MG -- --  PHOS -- --   Liver Function Tests:  Lab 06/28/12 0500  AST 17  ALT 14  ALKPHOS 73  BILITOT 0.3  PROT 6.1  ALBUMIN 2.9*   CBC:  Lab 07/02/12 1004 06/28/12 0500 06/27/12 2231  WBC 10.4 3.8* --  NEUTROABS -- -- 5.9  HGB 13.2 10.9* --  HCT 39.1 32.6* --  MCV 90.7 92.1 --  PLT 273 202 --    Studies/Results: Dg Chest Portable 1 View  06/27/2012  *RADIOLOGY REPORT*  Clinical Data: Shortness of breath.  PORTABLE CHEST - 1 VIEW  Comparison: 02/10/2012  Findings: Borderline heart size with normal pulmonary vascularity. The lungs appear hyperinflated with scattered fibrosis consistent with emphysema.  No focal airspace consolidation.  There may be blunting of the left costophrenic angle due to the heart shadow or too small pleural effusion.  No pneumothorax.  Mediastinal contours appear intact.  Calcified and tortuous aorta.  IMPRESSION: Emphysematous changes and scattered fibrosis in the lungs. Possible small left pleural effusion.  No focal consolidation.   Original Report Authenticated By: Burman Nieves, M.D.     Medications: Scheduled Meds:    . atorvastatin  10 mg Oral QODAY  . doxycycline  100 mg Oral Q12H  . fluconazole  200 mg Oral Once  . levalbuterol  0.63 mg Nebulization Q6H   And  . ipratropium  0.5 mg Nebulization Q6H  . levothyroxine  25 mcg Oral QAC breakfast  . metoprolol succinate  100 mg Oral Daily  . mometasone-formoterol  2 puff Inhalation BID  . nystatin  5 mL Oral QID  . pantoprazole  40 mg Oral Q0600  . predniSONE  40 mg Oral Q breakfast  . propafenone  150 mg Oral Q8H  . senna-docusate  2 tablet Oral QHS  . sodium chloride  3 mL Intravenous Q12H  . temazepam  15 mg  Oral QHS  . warfarin  4 mg Oral ONCE-1800  . Warfarin - Pharmacist Dosing Inpatient   Does not apply q1800      LOS: 5 days   Margean Korell M.D. [facility] 07/02/2012, 4:56 PM Pager: [phone]  If 7PM-7AM, please contact night-coverage [email or web address] Password [email or web address]  Addendum: spoke to her daughter in detail of the results of the Echo. She wants to defer the TEE and watch her on heparin and coumadin. We will [facility] cardiology for further recommendations.

## 2012-07-02 NOTE — Telephone Encounter (Signed)
I spoke with pt daughter and she stated pt mother is currently hospitalized. She stated she has requested pulmonary consult several times but no one has came in to see her and has even made this request to her physician that comes in there and see's pt. She is concerned about this. She just wants to make sure MW is aware pt is in the hospital. Please advise thanks

## 2012-07-02 NOTE — Consult Note (Signed)
Reason for Consult:atrial fib Referring Physician: [facility] Primary cardiologist: Dr. Robynn Pane is an 79 y.o. female.  HPI: This 79 year old woman with a past history of atrial fibrillation was admitted on 06/28/12 with an exacerbation of COPD with significant wheezing.  On admission her electrocardiogram showed that she was in normal sinus rhythm.  She has subsequently gone back into atrial fibrillation in the hospital.  Her ventricular rate was controlled on current medication.  She has had cardioversions twice in the past.  She has been on long-term Coumadin and her INR was supratherapeutic on admission.  She has been on Rythmol 150 mg every 8 hours chronically.  Echocardiogram done yesterday shows normal left ventricular systolic function with ejection fraction of 55-60%, mild aortic insufficiency and mild mitral regurgitation and mild left atrial enlargement.  The patient denies any chest pain.  She has chronic dyspnea.  She complains of weakness and had an episode of low blood pressure yesterday requiring a fluid bolus.  In the hospital she has had a declining hemoglobin.  Past Medical History  Diagnosis Date  . HTN (hypertension)   . A-fib   . Dyslipidemia   . Hypothyroidism   . Stroke   . Asthma   . Shortness of breath   . DEMENTIA   . CHF (congestive heart failure)   . GERD (gastroesophageal reflux disease)   . Anxiety   . COPD (chronic obstructive pulmonary disease)   . STROKE 04/14/2007    Past Surgical History  Procedure Date  . Total abdominal hysterectomy   . Breast surgery     45-50 YEARS AGO LUMP REMOVED  . Appendectomy   . Cardioversion 06/26/2011    Procedure: CARDIOVERSION;  Surgeon: Marca Ancona, MD;  Location[facility] OR;  Service: Cardiovascular;  Laterality: N/A;    Family History  Problem Relation Age of Onset  . Coronary artery disease Neg Hx   . Stroke Brother   . Breast cancer Mother     Social History:  reports that she has never  smoked. She does not have any smokeless tobacco history on file. She reports that she does not drink alcohol or use illicit drugs.  Allergies:  Allergies  Allergen Reactions  . Ciprofloxacin     unknown  . Quinolones     unknown  . Sulfonamide Derivatives     unknown  . Zolpidem Tartrate     Hallucinations    Medications:  Scheduled:   . atorvastatin  10 mg Oral QODAY  . benzonatate  100 mg Oral TID  . doxycycline  100 mg Oral Q12H  . levalbuterol  0.63 mg Nebulization Q6H   And  . ipratropium  0.5 mg Nebulization Q6H  . levothyroxine  25 mcg Oral QAC breakfast  . metoprolol succinate  100 mg Oral Daily  . mometasone-formoterol  2 puff Inhalation BID  . nystatin  5 mL Oral QID  . pantoprazole  40 mg Oral Q0600  . predniSONE  40 mg Oral Q breakfast  . propafenone  150 mg Oral Q8H  . senna-docusate  2 tablet Oral QHS  . sodium chloride  3 mL Intravenous Q12H  . temazepam  15 mg Oral QHS  . Warfarin - Pharmacist Dosing Inpatient   Does not apply q1800    Results for orders placed during the hospital encounter of 06/27/12 (from the past 48 hour(s))  PROTIME-INR     Status: Abnormal   Collection Time   07/01/12  4:55 AM  Component Value Range Comment   Prothrombin Time 19.6 (*) 11.6 - 15.2 seconds    INR 1.72 (*) 0.00 - 1.49   BASIC METABOLIC PANEL     Status: Abnormal   Collection Time   07/01/12  4:55 AM      Component Value Range Comment   Sodium 134 (*) 135 - 145 mEq/L    Potassium 4.9  3.5 - 5.1 mEq/L    Chloride 101  96 - 112 mEq/L    CO2 27  19 - 32 mEq/L    Glucose, Bld 125 (*) 70 - 99 mg/dL    BUN 47 (*) 6 - 23 mg/dL    Creatinine, Ser 9.60 (*) 0.50 - 1.10 mg/dL    Calcium 8.4  8.4 - 45.4 mg/dL    GFR calc non Af Amer 32 (*) >90 mL/min    GFR calc Af Amer 37 (*) >90 mL/min   PROTIME-INR     Status: Abnormal   Collection Time   07/02/12  5:55 AM      Component Value Range Comment   Prothrombin Time 22.4 (*) 11.6 - 15.2 seconds    INR 2.06 (*) 0.00 -  1.49   HEPARIN LEVEL (UNFRACTIONATED)     Status: Abnormal   Collection Time   07/02/12  5:55 AM      Component Value Range Comment   Heparin Unfractionated <0.10 (*) 0.30 - 0.70 IU/mL     Dg Chest 2 View  07/01/2012  *RADIOLOGY REPORT*  Clinical Data: Persistent cough and shortness of breath.  CHEST - 2 VIEW  Comparison: 06/27/2012  Findings: Hyperexpansion with attenuation of peripheral lung markings suggests emphysema.  No edema or focal airspace consolidation. The cardiopericardial silhouette is enlarged. Probable tiny left pleural effusion. Imaged bony structures of the thorax are intact.  IMPRESSION: Cardiomegaly with emphysema and probable small left pleural effusion.   Original Report Authenticated By: Kennith Center, M.D.    Review of systems: She denies any chills or fever.  No history of pleuritic chest pain.  No calf tenderness.    Blood pressure 129/93, pulse 95, temperature 98.3 F (36.8 C), temperature source Oral, resp. rate 18, height 5\' 1"  (1.549 m), weight 111 lb 15.9 oz (50.8 kg), SpO2 98.00%. The patient appears to be in no distress.  The patient appears to be slightly pale  Head and neck exam reveals that the pupils are equal and reactive.  The extraocular movements are full.  There is no scleral icterus.  Mouth and pharynx are benign.  No lymphadenopathy.  No carotid bruits.  The jugular venous pressure is normal.  Thyroid is not enlarged or tender.  Chest reveals mild expiratory wheezes bilaterally  Heart reveals no abnormal lift or heave.  First and second heart sounds are normal.  There is no murmur gallop rub or click.  The abdomen is soft and nontender.  Bowel sounds are normoactive.  There is no hepatosplenomegaly or mass.  There are no abdominal bruits.  Extremities reveal no phlebitis or edema.  Pedal pulses are good.  There is no cyanosis or clubbing.  Neurologic exam is normal strength and no lateralizing weakness.  No sensory deficits.  Integument reveals no  rash  Assessment/Plan: 1.  COPD exacerbation 2.  past history of paroxysmal atrial fibrillation.  EKG on admission showed normal sinus rhythm.  EKG on 06/30/12 showed normal sinus rhythm with frequent PACs.  Telemetry now shows atrial fibrillation. 3.  Anemia.  Hemoglobin in September 2013 was 13.5  and on admission here was 12.1 and on 06/28/12 had dropped to 10.9. 4. Dyslipidemia 5. Hypothyroidism  Recommendation: At this point I would continue her long-term Coumadin anticoagulation and would continue current dose of Rythmol 150 mg every 8 hours.  She would prefer not to have a TEE or another cardioversion in the near future.  It would be reasonable to see if she will convert back to normal sinus rhythm on her own in the next several weeks.  Otherwise repeat cardioversion as an outpatient could be considered.  Presently she has exacerbating factors which may have triggered her recurrent atrial fibrillation including her anemia and her acute respiratory illness. We will update her CBC.  Her anemia may be contributing to her overall feeling of weakness. Will follow with you.    Cassell Clement 07/02/2012, 9:40 AM

## 2012-07-02 NOTE — Progress Notes (Signed)
ANTICOAGULATION CONSULT NOTE - Follow Up Consult  Pharmacy Consult for Heparin and Coumadin Indication: atrial fibrillation  Allergies  Allergen Reactions  . Ciprofloxacin     unknown  . Quinolones     unknown  . Sulfonamide Derivatives     unknown  . Zolpidem Tartrate     Hallucinations    Patient Measurements: Height: 5\' 1"  (154.9 cm) Weight: 111 lb 15.9 oz (50.8 kg) IBW/kg (Calculated) : 47.8   Vital Signs: Temp: 98.3 F (36.8 C) (02/06 0510) Temp src: Oral (02/06 0955) BP: 101/58 mmHg (02/06 0955) Pulse Rate: 84  (02/06 0955)  Labs:  Basename 07/02/12 1004 07/02/12 0555 07/01/12 0455 06/30/12 0515  HGB 13.2 -- -- --  HCT 39.1 -- -- --  PLT 273 -- -- --  APTT -- -- -- --  LABPROT -- 22.4* 19.6* 25.4*  INR -- 2.06* 1.72* 2.44*  HEPARINUNFRC -- <0.10* -- --  CREATININE -- -- 1.43* 1.40*  CKTOTAL -- -- -- --  CKMB -- -- -- --  TROPONINI -- -- -- --    Estimated Creatinine Clearance: 20.5 ml/min (by C-G formula based on Cr of 1.43).  Assessment: 89yof on heparin bridging to Coumadin for Afib. Heparin drip was started 2/5 PM for subtherapeutic INR. INR (2.06) now therapeutic after trending up overnight - will discontinue heparin bridge (confirmed with MD Akula) and restart patient's home Coumadin regimen (4mg  daily except 2mg  MWF). - H/H and Plts improving - No significant bleeding reported  Goal of Therapy:  INR 2-3 Monitor platelets by anticoagulation protocol: Yes   Plan:  1. Discontinue heparin drip, levels, protocol 2. Coumadin 4mg  po x 1 today 3. Follow-up AM INR and discharge plans  Cleon Dew [phone] 07/02/2012,1:16 PM

## 2012-07-03 DIAGNOSIS — I10 Essential (primary) hypertension: Secondary | ICD-10-CM

## 2012-07-03 LAB — CBC
HCT: 37.5 % (ref 36.0–46.0)
MCV: 90.4 fL (ref 78.0–100.0)
Platelets: 268 10*3/uL (ref 150–400)
RDW: 13.8 % (ref 11.5–15.5)
WBC: 8.2 10*3/uL (ref 4.0–10.5)

## 2012-07-03 LAB — BASIC METABOLIC PANEL
CO2: 27 mEq/L (ref 19–32)
Calcium: 8.3 mg/dL — ABNORMAL LOW (ref 8.4–10.5)
GFR calc non Af Amer: 45 mL/min — ABNORMAL LOW (ref >90)
Potassium: 4.3 mEq/L (ref 3.5–5.1)
Sodium: 138 mEq/L (ref 135–145)

## 2012-07-03 MED ORDER — GUAIFENESIN-DM 100-10 MG/5ML PO SYRP: 5.0000 mL | ORAL_SOLUTION | ORAL | Status: DC | PRN

## 2012-07-03 MED ORDER — SENNOSIDES-DOCUSATE SODIUM 8.6-50 MG PO TABS: 2.0000 | ORAL_TABLET | Freq: Every day | ORAL | Status: DC

## 2012-07-03 MED ORDER — WARFARIN SODIUM 1 MG PO TABS
1.0000 mg | ORAL_TABLET | Freq: Once | ORAL | Status: AC
  Administered 2012-07-03: 1 mg via ORAL
  Filled 2012-07-03: qty 1

## 2012-07-03 MED ORDER — HYDROCOD POLST-CHLORPHEN POLST 10-8 MG/5ML PO LQCR: 5.0000 mL | Freq: Two times a day (BID) | ORAL | Status: DC | PRN

## 2012-07-03 MED ORDER — PREDNISONE 10 MG PO TABS: ORAL_TABLET | ORAL | Status: DC

## 2012-07-03 MED ORDER — ALPRAZOLAM 0.25 MG PO TABS: 0.2500 mg | ORAL_TABLET | Freq: Every day | ORAL | Status: DC | PRN

## 2012-07-03 NOTE — Clinical Social Work Psychosocial (Signed)
     Clinical Social Work Department BRIEF PSYCHOSOCIAL ASSESSMENT 07/03/2012  Patient:  Julie Rich, Julie Rich     Account Number:  [id number]     Admit date:  06/27/2012  Clinical Social Worker:  Morton Stall, CLINICAL SOCIAL WORKER  Date/Time:  07/02/2012 03:30 PM  Referred by:  Physician  Date Referred:  07/02/2012 Referred for  SNF Placement   Other Referral:   Interview type:  Patient Other interview type:    PSYCHOSOCIAL DATA Living Status:  FAMILY Admitted from facility:   Level of care:   Primary support name:  Julie Rich Primary support relationship to patient:  CHILD, ADULT Degree of support available:   Adequate support from daughter, Julie Rich.    CURRENT CONCERNS Current Concerns  Post-Acute Placement   Other Concerns:    SOCIAL WORK ASSESSMENT / PLAN Clinical Social work intern spoke with pt and pt's dtr about going to a SNF as a backup plan for placement after discharge. Pt's dtr stated that she would like to take her mother home if she could but would be willing to look into SNF's. CSW intern will fax pt out to facilities to see who offers a bed. PT/OT is  still evaluating pt. CSW to follow up with bed offers and as needed.   Assessment/plan status:   Other assessment/ plan:   Information/referral to community resources:   SNF placement    PATIENTS/FAMILYS RESPONSE TO PLAN OF CARE: Pt and pt dtr where receptive to the information given to them by the CSW intern about placement after discharge.  Pt and Pt's dtr thanked CSW intern for her help.

## 2012-07-03 NOTE — Discharge Summary (Signed)
Physician Discharge Summary  Julie Rich ZOX:[id number] DOB: 04/19/1922 DOA: 06/27/2012  PCP: Ezequiel Kayser, MD  Admit date: 06/27/2012 Discharge date: 07/03/2012  Time spent: 31 minutes  Recommendations for Outpatient Follow-up:  1. Follow up with cardiology a nd pulmonary as recommended.   Discharge Diagnoses:  Principal Problem:  *COPD exacerbation Active Problems:  HYPERLIPIDEMIA  HYPERTENSION  Atrial fibrillation  STROKE  Atrial flutter  Diastolic CHF, chronic   Discharge Condition: stable  Diet recommendation: low sodium diet  Filed Weights   07/01/12 0500 07/02/12 0510 07/03/12 0435  Weight: 51.7 kg (113 lb 15.7 oz) 50.8 kg (111 lb 15.9 oz) 51.529 kg (113 lb 9.6 oz)    History of present illness:  This is a 79 y.o. year old female with significant past medical history of CHF, A. fib, airway obstruction presenting with shortness of breath cough and wheezing x3 days. Patient states that she was at home and otherwise baseline state health when she noticed progressive worsening of breathing. Patient has had worsening shortness of breath wheezing and cough that has not improved despite albuterol treatments at home. Symptoms acutely worsened today. Patient fell machine to come to the emergency room for evaluation. Patient does report a baseline history of CHF. Patient is not on daily Lasix for this. Patient denies any orthopnea, PND, lotion the edema. No chest pain. Patient denies any increased salt intake or increase fluid intake.  No fevers or chills. No body aches. Patient has had her flu shot this year.  Patient is brought to the ED with noted O2 sats in the low 70s on room air. Patient is given ice IV Solu-Medrol as well as DuoNeb treatment and supplemental oxygen with subsequent improvement in oxygenation into the mid 90s. However, patient was reported the setting of a low 70s with ambulation.  Patient has never been a smoker but does report extensive secondhand smoke exposure  in the past.   Hospital Course:   *COPD exacerbation: wheezing significantly improved. - Continue albuterol and Atrovent nebs q6,  - DC'd Solu-Medrol, started prednisone, continue dulera  - resume spiriva on discharge. -Continue flutter valve, robitussin, cepacol   HYPERLIPIDEMIA: Continue Lipitor  Hypotension: resolved.  Atrial fibrillation: On beta blocker, Rythmol and Coumadin  - EKG shows ST, 2ndAVB, echo pending, on xopenex and atrovent  - Changed the dose of Rythmol (she is on 150 mg TID).  - she remains in A FIB. INR therapeutic.  History of STROKE: Patient is on Coumadin  With therapeutic INR. chronic diastolic congestive heart failure: Patient has no symptoms of orthopnea, PND or peripheral edema. Her symptoms are mostly due to COPD exacerbation  - 2-D echo 06/10/2011: EF of 50-55%, mild AR.  Repeat echo shows a hypodensity in LAA, and she was started on IV heparin on 2/5 and discontinued on 2/6 as her INR became therapeutic. Recommend follow up with cardiology in 1 to 2 weeks on discharge.  Oral thrush: pain improved. on nystatin suspension. Ordered one dose of diflucan.      Consultations:  Cardiology   Discharge Exam: Filed Vitals:   07/03/12 1008 07/03/12 1016 07/03/12 1325 07/03/12 1405  BP:  121/60 137/82   Pulse: 92 89 82   Temp:   98.3 F (36.8 C)   TempSrc:   Oral   Resp:   20   Height:      Weight:      SpO2: 95%  98% 98%   General: Alert and awake, oriented x3, nad.  CVS: S1-S2  clear, no murmur rubs or gallops  Chest: no wheezing good air entry b/l  Abdomen: soft NT, ND, NBS  Extremities: no c/C/E    Discharge Instructions      Discharge Orders    Future Appointments: Provider: Department: Dept Phone: Center:   07/10/2012 3:30 PM Nyoka Cowden, MD [facility] [phone] None     Future Orders Please Complete By Expires   Diet - low sodium heart healthy      Discharge instructions      Comments:   Follow up with cardiology  and pulmonary as recommended.   Activity as tolerated - No restrictions          Medication List     As of 07/03/2012  2:11 PM    STOP taking these medications         amLODipine 10 MG tablet   Commonly known as: NORVASC      losartan 100 MG tablet   Commonly known as: COZAAR      TAKE these medications         ADVAIR DISKUS 250-50 MCG/DOSE Aepb   Generic drug: Fluticasone-Salmeterol   Inhale 1 puff into the lungs 2 (two) times daily.      albuterol 108 (90 BASE) MCG/ACT inhaler   Commonly known as: PROVENTIL HFA;VENTOLIN HFA   Inhale 2 puffs into the lungs every 6 (six) hours as needed. For shortness of breath/wheezing      ALPRAZolam 0.25 MG tablet   Commonly known as: XANAX   Take 1 tablet (0.25 mg total) by mouth daily as needed. For anxiety      atorvastatin 20 MG tablet   Commonly known as: LIPITOR   Take 10 mg by mouth every other day.      chlorpheniramine-HYDROcodone 10-8 MG/5ML Lqcr   Commonly known as: TUSSIONEX   Take 5 mLs by mouth every 12 (twelve) hours as needed.      guaiFENesin-dextromethorphan 100-10 MG/5ML syrup   Commonly known as: [name] DM   Take 5 mLs by mouth every 4 (four) hours as needed for cough.      levothyroxine 25 MCG tablet   Commonly known as: SYNTHROID, LEVOTHROID   Take 25 mcg by mouth daily.      metoprolol succinate 100 MG 24 hr tablet   Commonly known as: TOPROL-XL   Take 100 mg by mouth daily.      predniSONE 10 MG tablet   Commonly known as: DELTASONE   Prednisone 30 mg daily for 2 days followe dby   Prednisone 20 mg daily for 3 days followed by  Prednisone 10 mg daily for 3 more days.      propafenone 150 MG tablet   Commonly known as: RYTHMOL   Take 150 mg by mouth every 8 (eight) hours.      RESTORIL 15 MG capsule   Generic drug: temazepam   Take 15 mg by mouth at bedtime.      senna-docusate 8.6-50 MG per tablet   Commonly known as: Senokot-S   Take 2 tablets by mouth at bedtime.      tiotropium 18  MCG inhalation capsule   Commonly known as: SPIRIVA   Place 18 mcg into inhaler and inhale daily.      warfarin 2 MG tablet   Commonly known as: COUMADIN   Take 2-4 mg by mouth daily. 2mg  on Monday, Wednesday, Friday. 4mg  on Tuesday, Thursday, Saturday and Sunday  The results of significant diagnostics from this hospitalization (including imaging, microbiology, ancillary and laboratory) are listed below for reference.    Significant Diagnostic Studies: Dg Chest 2 View  07/01/2012  *RADIOLOGY REPORT*  Clinical Data: Persistent cough and shortness of breath.  CHEST - 2 VIEW  Comparison: 06/27/2012  Findings: Hyperexpansion with attenuation of peripheral lung markings suggests emphysema.  No edema or focal airspace consolidation. The cardiopericardial silhouette is enlarged. Probable tiny left pleural effusion. Imaged bony structures of the thorax are intact.  IMPRESSION: Cardiomegaly with emphysema and probable small left pleural effusion.   Original Report Authenticated By: Kennith Center, M.D.    Dg Chest Portable 1 View  06/27/2012  *RADIOLOGY REPORT*  Clinical Data: Shortness of breath.  PORTABLE CHEST - 1 VIEW  Comparison: 02/10/2012  Findings: Borderline heart size with normal pulmonary vascularity. The lungs appear hyperinflated with scattered fibrosis consistent with emphysema.  No focal airspace consolidation.  There may be blunting of the left costophrenic angle due to the heart shadow or too small pleural effusion.  No pneumothorax.  Mediastinal contours appear intact.  Calcified and tortuous aorta.  IMPRESSION: Emphysematous changes and scattered fibrosis in the lungs. Possible small left pleural effusion.  No focal consolidation.   Original Report Authenticated By: Burman Nieves, M.D.     Microbiology: No results found for this or any previous visit (from the past 240 hour(s)).   Labs: Basic Metabolic Panel:  Lab 07/03/12 1610 07/01/12 0455 06/30/12 0515 06/29/12 0640  06/28/12 0500  NA 138 134* 134* 132* 132*  K 4.3 4.9 4.7 4.7 4.7  CL 105 101 98 98 99  CO2 27 27 29 25 26   GLUCOSE 94 125* 152* 167* 167*  BUN 29* 47* 37* 29* 21  CREATININE 1.07 1.43* 1.40* 1.29* 1.15*  CALCIUM 8.3* 8.4 8.8 9.1 8.3*  MG -- -- -- -- --  PHOS -- -- -- -- --   Liver Function Tests:  Lab 06/28/12 0500  AST 17  ALT 14  ALKPHOS 73  BILITOT 0.3  PROT 6.1  ALBUMIN 2.9*   No results found for this basename: LIPASE:5,AMYLASE:5 in the last 168 hours No results found for this basename: AMMONIA:5 in the last 168 hours CBC:  Lab 07/03/12 0618 07/02/12 1004 06/28/12 0500 06/27/12 2231  WBC 8.2 10.4 3.8* 7.0  NEUTROABS -- -- -- 5.9  HGB 12.5 13.2 10.9* 12.1  HCT 37.5 39.1 32.6* 36.7  MCV 90.4 90.7 92.1 92.2  PLT 268 273 202 217   Cardiac Enzymes: No results found for this basename: CKTOTAL:5,CKMB:5,CKMBINDEX:5,TROPONINI:5 in the last 168 hours BNP: BNP (last 3 results)  Basename 06/27/12 2231  PROBNP 1554.0*   CBG: No results found for this basename: GLUCAP:5 in the last 168 hours     Signed:  Charvis Lightner  [facility] 07/03/2012, 2:11 PM

## 2012-07-03 NOTE — Progress Notes (Signed)
1945 called  Julie Rich' main number [phone] and was connected to extension but nobody pick up [phone] to give report . Cn aware

## 2012-07-03 NOTE — Progress Notes (Signed)
Physical Therapy Treatment Patient Details Name: Julie Rich MRN: [id number] DOB: Jan 08, 1922 Today's Date: 07/03/2012 Time: 4098-1191 PT Time Calculation (min): 16 min  PT Assessment / Plan / Recommendation Comments on Treatment Session  Discussed returning home alone with patient and patient agreed she can not safely take care of her self at this time. Pt to benefit from ST-SNF placement to improved activity tolerance, strength, and balance to decrease fall risk and achieve safe mod independent function. RN notified of pt need for ST-SNF.    Follow Up Recommendations  SNF;Supervision/Assistance - 24 hour     Does the patient have the potential to tolerate intense rehabilitation     Barriers to Discharge        Equipment Recommendations       Recommendations for Other Services    Frequency Min 3X/week   Plan Discharge plan needs to be updated;Frequency remains appropriate    Precautions / Restrictions Precautions Precautions: Fall Restrictions Weight Bearing Restrictions: No   Pertinent Vitals/Pain Denies pain    Mobility  Bed Mobility Bed Mobility: Supine to Sit Supine to Sit: 6: Modified independent (Device/Increase time);HOB elevated Transfers Transfers: Sit to Stand;Stand to Sit Sit to Stand: 5: Supervision;With upper extremity assist;From bed;From toilet Stand to Sit: 4: Min guard;To chair/3-in-1;To toilet Details for Transfer Assistance: v/c's for safe hand placement and walker safety Ambulation/Gait Ambulation/Gait Assistance: 4: Min guard Ambulation Distance (Feet): 40 Feet Assistive device: Rolling walker Ambulation/Gait Assistance Details: pt with + DOE limiting amb distance this date, gaurded/cautious gait Gait Pattern: Step-through pattern;Decreased stride length Gait velocity: decreased Stairs:  (not tested due to pt fatigue)    Exercises     PT Diagnosis:    PT Problem List:   PT Treatment Interventions:     PT Goals Acute Rehab PT Goals PT  Goal: Sit to Stand - Progress: Progressing toward goal PT Goal: Stand to Sit - Progress: Progressing toward goal PT Goal: Ambulate - Progress: Progressing toward goal  Visit Information  Last PT Received On: 07/03/12 Assistance Needed: +1    Subjective Data  Subjective: Pt received sitting EOB eating breakfast   Cognition  Cognition Overall Cognitive Status: Appears within functional limits for tasks assessed/performed Arousal/Alertness: Awake/alert Orientation Level: Oriented X4 / Intact Behavior During Session: [facility] for tasks performed    Balance     End of Session PT - End of Session Equipment Utilized During Treatment: Gait belt Activity Tolerance: Patient limited by fatigue Patient left: in bed;with call bell/phone within reach;with bed alarm set Nurse Communication: Mobility status   GP     Marcene Brawn 07/03/2012, 11:26 AM  Lewis Shock, PT, DPT Pager #: [phone] Office #: [phone]

## 2012-07-03 NOTE — Clinical Social Work Placement (Signed)
     Clinical Social Work Department CLINICAL SOCIAL WORK PLACEMENT NOTE 07/03/2012  Patient:  BRYLEA, PITA  Account Number:  [id number] Admit date:  06/27/2012  Clinical Social Worker:  Lupita Leash Breland Elders, LCSWA  Date/time:  07/03/2012 03:43 PM  Clinical Social Work is seeking post-discharge placement for this patient at the following level of care:   SKILLED NURSING   (*CSW will update this form in Epic as items are completed)   07/02/2012  Patient/family provided with [facility] Department of Clinical Social Works list of facilities offering this level of care within the geographic area requested by the patient (or if unable, by the patients family).  07/02/2012  Patient/family informed of their freedom to choose among providers that offer the needed level of care, that participate in Medicare, Medicaid or managed care program needed by the patient, have an available bed and are willing to accept the patient.  07/02/2012  Patient/family informed of [facility] ownership interest in [facility], as well as of the fact that they are under no obligation to receive care at this facility.  PASARR submitted to EDS on 07/02/2012 PASARR number received from EDS on 07/02/2012  FL2 transmitted to all facilities in geographic area requested by pt/family on  07/02/2012 FL2 transmitted to all facilities within larger geographic area on   Patient informed that his/her managed care company has contracts with or will negotiate with  certain facilities, including the following:   NA     Patient/family informed of bed offers received:  07/03/2012 Patient chooses bed at [facility] Physician recommends and patient chooses bed at    Patient to be transferred to [facility] on  07/03/2012 Patient to be transferred to facility by aMBULANCE  Sharin Mons)  The following physician request were entered in Epic:   Additional Comments: Patient and  daughter are pleased with d/c plan. Notified SNF and pt's nurse- Pattricia Boss of d/c. No further CSW needs identified.  Lorri Frederick. Breasia Karges, LCSWA  [phone]

## 2012-07-03 NOTE — Progress Notes (Signed)
ANTICOAGULATION CONSULT NOTE - Follow Up Consult  Pharmacy Consult for Coumadin Indication: atrial fibrillation  Allergies  Allergen Reactions  . Ciprofloxacin     unknown  . Quinolones     unknown  . Sulfonamide Derivatives     unknown  . Zolpidem Tartrate     Hallucinations    Patient Measurements: Height: 5\' 1"  (154.9 cm) Weight: 113 lb 9.6 oz (51.529 kg) (c scale) IBW/kg (Calculated) : 47.8  Heparin Dosing Weight:   Vital Signs: Temp: 98.1 F (36.7 C) (02/07 0435) Temp src: Oral (02/07 0435) BP: 116/68 mmHg (02/07 0435) Pulse Rate: 94  (02/07 0435)  Labs:  Basename 07/03/12 0618 07/02/12 1004 07/02/12 0555 07/01/12 0455  HGB 12.5 13.2 -- --  HCT 37.5 39.1 -- --  PLT 268 273 -- --  APTT -- -- -- --  LABPROT 28.6* -- 22.4* 19.6*  INR 2.87* -- 2.06* 1.72*  HEPARINUNFRC -- -- <0.10* --  CREATININE 1.07 -- -- 1.43*  CKTOTAL -- -- -- --  CKMB -- -- -- --  TROPONINI -- -- -- --    Estimated Creatinine Clearance: 27.4 ml/min (by C-G formula based on Cr of 1.07).  Assessment: 89yof continuing on Coumadin for Afib. INR (2.87) is therapeutic. INR increased greater than desired possibly due to fluconazole x 1 - will give a slightly smaller dose than home regimen (4mg  daily except 2mg  MWF). - H/H and Plts stable - No significant bleeding reported  Goal of Therapy:  INR 2-3   Plan:  1. Coumadin 1mg  po x 1 today 2. Follow-up AM INR and discharge plans  Cleon Dew [phone] 07/03/2012,9:58 AM

## 2012-07-03 NOTE — Progress Notes (Signed)
1800 Several calls attempted to call for report but no avail .Reciever of the call  Got the hospital numbers and said will tell the nurse to the unit for report .avoided to have her name recognized . CN aware . will follow up  1803 left to [facility] via Production manager

## 2012-07-03 NOTE — Progress Notes (Signed)
SUBJECTIVE:  Still coughing.  No SOB.  No pain.    PHYSICAL EXAM Filed Vitals:   07/02/12 0955 07/02/12 1447 07/02/12 2039 07/03/12 0435  BP: 101/58 105/68 126/73 116/68  Pulse: 84 93 105 94  Temp:  98.4 F (36.9 C) 98.1 F (36.7 C) 98.1 F (36.7 C)  TempSrc: Oral Oral Oral Oral  Resp: 20 18 22 20   Height:      Weight:    113 lb 9.6 oz (51.529 kg)  SpO2: 96% 94% 94% 100%   General:  No distress Lungs:  Decreased breath sounds Heart:  Irregular Abdomen:  Positive bowel sounds, no rebound no guarding Extremities:  No edema.   LABS: Lab Results  Component Value Date   CKTOTAL 41 07/04/2011   CKMB 2.5 07/04/2011   TROPONINI <0.30 07/04/2011   Results for orders placed during the hospital encounter of 06/27/12 (from the past 24 hour(s))  CBC     Status: Normal   Collection Time   07/02/12 10:04 AM      Component Value Range   WBC 10.4  4.0 - 10.5 K/uL   RBC 4.31  3.87 - 5.11 MIL/uL   Hemoglobin 13.2  12.0 - 15.0 g/dL   HCT 57.8  46.9 - 62.9 %   MCV 90.7  78.0 - 100.0 fL   MCH 30.6  26.0 - 34.0 pg   MCHC 33.8  30.0 - 36.0 g/dL   RDW 52.8  41.3 - 24.4 %   Platelets 273  150 - 400 K/uL  PROTIME-INR     Status: Abnormal   Collection Time   07/03/12  6:18 AM      Component Value Range   Prothrombin Time 28.6 (*) 11.6 - 15.2 seconds   INR 2.87 (*) 0.00 - 1.49  CBC     Status: Normal   Collection Time   07/03/12  6:18 AM      Component Value Range   WBC 8.2  4.0 - 10.5 K/uL   RBC 4.15  3.87 - 5.11 MIL/uL   Hemoglobin 12.5  12.0 - 15.0 g/dL   HCT 01.0  27.2 - 53.6 %   MCV 90.4  78.0 - 100.0 fL   MCH 30.1  26.0 - 34.0 pg   MCHC 33.3  30.0 - 36.0 g/dL   RDW 64.4  03.4 - 74.2 %   Platelets 268  150 - 400 K/uL  BASIC METABOLIC PANEL     Status: Abnormal   Collection Time   07/03/12  6:18 AM      Component Value Range   Sodium 138  135 - 145 mEq/L   Potassium 4.3  3.5 - 5.1 mEq/L   Chloride 105  96 - 112 mEq/L   CO2 27  19 - 32 mEq/L   Glucose, Bld 94  70 - 99 mg/dL   BUN  29 (*) 6 - 23 mg/dL   Creatinine, Ser 5.95  0.50 - 1.10 mg/dL   Calcium 8.3 (*) 8.4 - 10.5 mg/dL   GFR calc non Af Amer 45 (*) >90 mL/min   GFR calc Af Amer 52 (*) >90 mL/min    Intake/Output Summary (Last 24 hours) at 07/03/12 0725 Last data filed at 07/03/12 0451  Gross per 24 hour  Intake    343 ml  Output   1001 ml  Net   -658 ml    ASSESSMENT AND PLAN:  Atrial fibrillation:  Still in fib.  Rate OK.  On therapeutic anticoagulation.  No plans to change therapy at this point.    Diastolic CHF, chronic:  She seems to be euvolemic.  Continue current therapy.     Fayrene Fearing [facility] 07/03/2012 7:25 AM

## 2012-07-05 ENCOUNTER — Encounter: Payer: Self-pay | Admitting: Physician Assistant

## 2012-07-06 ENCOUNTER — Telehealth: Payer: Self-pay | Admitting: Cardiology

## 2012-07-06 NOTE — Telephone Encounter (Signed)
Left message to call back  

## 2012-07-06 NOTE — Telephone Encounter (Signed)
New Problem   Pts daughter has a question about follow up appt from hospital visit.

## 2012-07-07 NOTE — Telephone Encounter (Signed)
Pt scheduled for 12:15 2/19 in [place] office.  Left message for [name]

## 2012-07-07 NOTE — Telephone Encounter (Signed)
Left another message - (4th attempt today) will continue to attempt to contact.

## 2012-07-07 NOTE — Telephone Encounter (Signed)
Follow up call     [name] calling    Please advise should she make an appt this week or keep appt in [place] . Patient at [facility] rehab now. Discuss cardioversion .

## 2012-07-07 NOTE — Telephone Encounter (Signed)
Follow Up   Julie Rich received an email from [facility] [place] stating an ajustment to Dr. Lindaann Slough schedule on 2/19 in [place] last pt at 1:00 PM and on 2/24 in [place] block from 2-3 starting back at 3:15. Call was made to pt to reschedule appt. Next available is March 5 but pt is concerned it is too far out. Would like to speak to  You.

## 2012-07-08 ENCOUNTER — Encounter: Payer: Self-pay | Admitting: Physician Assistant

## 2012-07-08 LAB — PROTIME-INR: INR: 4.3 — AB (ref 0.9–1.1)

## 2012-07-10 ENCOUNTER — Encounter: Payer: Self-pay | Admitting: Physician Assistant

## 2012-07-10 ENCOUNTER — Inpatient Hospital Stay: Payer: Medicare Other | Admitting: Internal Medicine

## 2012-07-10 LAB — PROTIME-INR: INR: 2.6 — AB (ref 0.9–1.1)

## 2012-07-13 ENCOUNTER — Telehealth: Payer: Self-pay | Admitting: Cardiology

## 2012-07-13 NOTE — Telephone Encounter (Signed)
Julie Rich,  See previous conversations below with this patient. Unless she can be worked in on Dr. [employer] schedule tomorrow or scheduled with the PA, I don't have any other options for her. Can you please see what you can do with trying to schedule this patient for tomorrow?

## 2012-07-13 NOTE — Telephone Encounter (Signed)
I spoke with the patient's daughter. Dr. Antoine Poche had a cancellation on 2/20 at 4:00 pm in [place]. The patient has been rescheduled to this time slot and her daughter is aware.

## 2012-07-13 NOTE — Telephone Encounter (Signed)
Follow Up    Pt's daughter has some questions in regards to pt's appt and following procedure. Please call.

## 2012-07-13 NOTE — Telephone Encounter (Signed)
I spoke with the patient's daughter, [name]. She is concerned about the patient's appointment in [place] for Wednesday this week. The patient is currently at [facility] which is right across the street from our [place] office. She needs to have post hospital follow up. The patient's daughter states she may need a cardioversion, and she would like to have this done before the patient is discharged home. She will most likely be at [facility] this week and maybe until the middle of next week. I advised I will call her back after I can speak with Dr. Jenene Slicker scheduler. [name] is agreeable. Sherri Rad, RN, BSN  Marlowe Kays is on the phone now. I have left a message for her to call me regarding Dr. Jenene Slicker schedule. Sherri Rad, RN, BSN

## 2012-07-13 NOTE — Telephone Encounter (Signed)
New problem    Spoken with [facility]    Discharge from [place] on 2/24. Daughter is asking can she be seen sooner by MD / schedule cardioversion before she is discharge.

## 2012-07-14 ENCOUNTER — Telehealth: Payer: Self-pay | Admitting: *Deleted

## 2012-07-14 ENCOUNTER — Encounter: Payer: Self-pay | Admitting: *Deleted

## 2012-07-14 ENCOUNTER — Encounter: Payer: Self-pay | Admitting: Physician Assistant

## 2012-07-14 ENCOUNTER — Ambulatory Visit (INDEPENDENT_AMBULATORY_CARE_PROVIDER_SITE_OTHER): Payer: Medicare Other | Admitting: Physician Assistant

## 2012-07-14 VITALS — BP 132/70 | HR 114 | Ht 61.0 in | Wt 108.1 lb

## 2012-07-14 DIAGNOSIS — I5032 Chronic diastolic (congestive) heart failure: Secondary | ICD-10-CM

## 2012-07-14 DIAGNOSIS — I10 Essential (primary) hypertension: Secondary | ICD-10-CM

## 2012-07-14 DIAGNOSIS — I4891 Unspecified atrial fibrillation: Secondary | ICD-10-CM

## 2012-07-14 LAB — BASIC METABOLIC PANEL
BUN: 30 mg/dL — ABNORMAL HIGH (ref 6–23)
CO2: 29 mEq/L (ref 19–32)
Chloride: 105 mEq/L (ref 96–112)
Glucose, Bld: 102 mg/dL — ABNORMAL HIGH (ref 70–99)
Potassium: 4.6 mEq/L (ref 3.5–5.1)

## 2012-07-14 LAB — CBC WITH DIFFERENTIAL/PLATELET
Basophils Relative: 0.2 % (ref 0.0–3.0)
Eosinophils Relative: 0.5 % (ref 0.0–5.0)
Hemoglobin: 12.9 g/dL (ref 12.0–15.0)
Lymphocytes Relative: 11.2 % — ABNORMAL LOW (ref 12.0–46.0)
Monocytes Relative: 8 % (ref 3.0–12.0)
Neutro Abs: 8 10*3/uL — ABNORMAL HIGH (ref 1.4–7.7)
Neutrophils Relative %: 80.1 % — ABNORMAL HIGH (ref 43.0–77.0)
RBC: 4.3 Mil/uL (ref 3.87–5.11)
WBC: 10 10*3/uL (ref 4.5–10.5)

## 2012-07-14 MED ORDER — METOPROLOL SUCCINATE ER 100 MG PO TB24: ORAL_TABLET | ORAL | Status: DC

## 2012-07-14 NOTE — Progress Notes (Signed)
[address], [address] [place], [place]  [address] Phone: [phone], Fax:  [phone]  Date:  07/14/2012   ID:  Julie Rich, Julie Rich 04-05-1922, MRN [id number]  PCP:  Ezequiel Kayser, MD  Primary Cardiologist:  Dr. Rollene Rotunda   Primary Electrophysiologist:  Dr. Lewayne Bunting    History of Present Illness: Julie Rich is a 79 y.o. female who returns for followup after recent admission to the hospital with atrial fibrillation in the setting of COPD exacerbation.  She has a history of diastolic CHF, atrial fibrillation, SVT, HTN, HL, COPD, hypothyroidism, GERD, anxiety. She is on Coumadin. She was previously on flecainide but remains on Rythmol now.  Echo 2/14: Mild LVH, EF 55-60%, normal wall motion, mild AI, mild MR, mild LAE, trivial pericardial effusion. There was an echodensity noted either in the LAA or just distal to the pulmonic valve-question thrombus.  She was admitted 2/1-2/7. She presented with worsening dyspnea and cough with noted hypoxemia. She was treated with Solu-Medrol, prednisone and bronchodilators for her COPD exacerbation.  She developed recurrent atrial fibrillation and was seen by cardiology.  Rythmol was continued. Echo was done as noted above. She was covered with heparin until her INR became therapeutic.  Rate was controlled. It was thought that her acute COPD exacerbation may have exacerbated her atrial fibrillation. Cardioversion could be considered in the future if she did not revert back to sinus rhythm on her own.  She is currently residing at [facility]. She will be discharged next week.  She would like to pursue cardioversion prior to discharge as she lives in [place]. She remains in atrial fibrillation today by ECG. She does note dyspnea with exertion. This seems to be stable. She describes NYHA class IIb-III symptoms. She denies chest pain, syncope, near syncope, orthopnea, PND or edema. She does note occasional rapid palpitations.  Labs  (2/14):  K 4.3, creatinine 1.43 => 1.07, ALT 14, Hgb 12.5, TSH 0.648   Lab Results  Component Value Date   INR 2.87* 07/03/2012   INR 2.06* 07/02/2012   INR 1.72* 07/01/2012    INR 07/05/12:    2.9 INR 07/08/12:  4.3 INR 07/10/12:  2.6  Wt Readings from Last 3 Encounters:  07/14/12 108 lb 1.9 oz (49.043 kg)  07/03/12 113 lb 9.6 oz (51.529 kg)  01/22/12 114 lb (51.71 kg)     Past Medical History  Diagnosis Date  . HTN (hypertension)   . A-fib   . Dyslipidemia   . Hypothyroidism   . Stroke   . Asthma   . Shortness of breath   . DEMENTIA   . CHF (congestive heart failure)   . GERD (gastroesophageal reflux disease)   . Anxiety   . COPD (chronic obstructive pulmonary disease)   . STROKE 04/14/2007    Current Outpatient Prescriptions  Medication Sig Dispense Refill  . albuterol (PROVENTIL HFA;VENTOLIN HFA) 108 (90 BASE) MCG/ACT inhaler Inhale 2 puffs into the lungs every 6 (six) hours as needed. For shortness of breath/wheezing      . ALPRAZolam (XANAX) 0.25 MG tablet Take 1 tablet (0.25 mg total) by mouth daily as needed. For anxiety  30 tablet  0  . atorvastatin (LIPITOR) 20 MG tablet Take 10 mg by mouth every other day.      . chlorpheniramine-HYDROcodone (TUSSIONEX) 10-8 MG/5ML LQCR Take 5 mLs by mouth every 12 (twelve) hours as needed.  140 mL  0  . Fluticasone-Salmeterol (ADVAIR DISKUS) 250-50 MCG/DOSE AEPB Inhale 1  puff into the lungs 2 (two) times daily.       Marland Kitchen guaiFENesin-dextromethorphan ([name] DM) 100-10 MG/5ML syrup Take 5 mLs by mouth every 4 (four) hours as needed for cough.  118 mL  0  . levothyroxine (SYNTHROID, LEVOTHROID) 25 MCG tablet Take 25 mcg by mouth daily.       . metoprolol (TOPROL-XL) 100 MG 24 hr tablet Take 100 mg by mouth daily.       . predniSONE (DELTASONE) 10 MG tablet Prednisone 30 mg daily for 2 days followe dby  Prednisone 20 mg daily for 3 days followed by Prednisone 10 mg daily for 3 more days.  15 tablet  0  . propafenone (RYTHMOL) 150 MG  tablet Take 150 mg by mouth every 8 (eight) hours.      . senna-docusate (SENOKOT-S) 8.6-50 MG per tablet Take 2 tablets by mouth at bedtime.  30 tablet  0  . temazepam (RESTORIL) 15 MG capsule Take 15 mg by mouth at bedtime.       Marland Kitchen tiotropium (SPIRIVA) 18 MCG inhalation capsule Place 18 mcg into inhaler and inhale daily.       Marland Kitchen warfarin (COUMADIN) 2 MG tablet Take 2-4 mg by mouth daily. 2mg  on Monday, Wednesday, Friday. 4mg  on Tuesday, Thursday, Saturday and Sunday       No current facility-administered medications for this visit.    Allergies:    Allergies  Allergen Reactions  . Ciprofloxacin     unknown  . Quinolones     unknown  . Sulfonamide Derivatives     unknown  . Zolpidem Tartrate     Hallucinations    Social History:  The patient  reports that she has never smoked. She does not have any smokeless tobacco history on file. She reports that she does not drink alcohol or use illicit drugs.   ROS:  Please see the history of present illness.    All other systems reviewed and negative.   PHYSICAL EXAM: VS:  BP 132/70  Pulse 114  Ht 5\' 1"  (1.549 m)  Wt 108 lb 1.9 oz (49.043 kg)  BMI 20.44 kg/m2 Well nourished, well developed, in no acute distress HEENT: normal Neck: no JVD at 90 Cardiac:  normal S1, S2; regularly irregular rhythm; no murmur Lungs:  clear to auscultation bilaterally, no wheezing, rhonchi or rales Abd: soft, nontender Ext: no edema Skin: warm and dry Neuro:  CNs 2-12 intact, no focal abnormalities noted  EKG:  Atrial fibrillation, HR 114, LAD, inferolateral ST abnormality     ASSESSMENT AND PLAN:  1. Atrial Fibrillation: It is difficult to know how symptomatic she is with this. I reviewed her case today with Dr. Antoine Poche.  She did have a questionable mass versus thrombus on her transthoracic echocardiogram. Her INR was sub therapeutic while she was in the hospital. We will pursue cardioversion. She will require TEE guided cardioversion. This will be  set up later this week. We will have her INR checked the day prior. I will increase her Toprol to 100 mg the morning and 50 mg in the evening until the time of her cardioversion for better rate control. 2. Hypertension:  Controlled. 3. Diastolic CHF:  Volume stable. Continue current Rx. 4. Disposition:  Follow up with Dr. Rollene Rotunda in 3-4 weeks.   Signed, Tereso Newcomer, PA-C  12:00 PM 07/14/2012

## 2012-07-14 NOTE — H&P (Signed)
History and Physical  Date:  07/14/2012   ID:  Julie Rich, DOB 03/27/1922, MRN [id number]  PCP:  Ezequiel Kayser, MD  Primary Cardiologist:  Dr. Rollene Rotunda   Primary Electrophysiologist:  Dr. Lewayne Bunting    History of Present Illness: Julie Rich is a 79 y.o. female who returns for followup after recent admission to the hospital with atrial fibrillation in the setting of COPD exacerbation.  She has a history of diastolic CHF, atrial fibrillation, SVT, HTN, HL, COPD, hypothyroidism, GERD, anxiety. She is on Coumadin. She was previously on flecainide but remains on Rythmol now.  Echo 2/14: Mild LVH, EF 55-60%, normal wall motion, mild AI, mild MR, mild LAE, trivial pericardial effusion. There was an echodensity noted either in the LAA or just distal to the pulmonic valve-question thrombus.  She was admitted 2/1-2/7. She presented with worsening dyspnea and cough with noted hypoxemia. She was treated with Solu-Medrol, prednisone and bronchodilators for her COPD exacerbation.  She developed recurrent atrial fibrillation and was seen by cardiology.  Rythmol was continued. Echo was done as noted above. She was covered with heparin until her INR became therapeutic.  Rate was controlled. It was thought that her acute COPD exacerbation may have exacerbated her atrial fibrillation. Cardioversion could be considered in the future if she did not revert back to sinus rhythm on her own.  She is currently residing at [facility]. She will be discharged next week.  She would like to pursue cardioversion prior to discharge as she lives in [place]. She remains in atrial fibrillation today by ECG. She does note dyspnea with exertion. This seems to be stable. She describes NYHA class IIb-III symptoms. She denies chest pain, syncope, near syncope, orthopnea, PND or edema. She does note occasional rapid palpitations.  Labs (2/14):  K 4.3, creatinine 1.43 => 1.07, ALT 14, Hgb 12.5, TSH 0.648   Lab Results    Component Value Date   INR 2.87* 07/03/2012   INR 2.06* 07/02/2012   INR 1.72* 07/01/2012    INR 07/05/12:    2.9 INR 07/08/12:  4.3 INR 07/10/12:  2.6  Wt Readings from Last 3 Encounters:  07/14/12 108 lb 1.9 oz (49.043 kg)  07/03/12 113 lb 9.6 oz (51.529 kg)  01/22/12 114 lb (51.71 kg)     Past Medical History  Diagnosis Date  . HTN (hypertension)   . A-fib   . Dyslipidemia   . Hypothyroidism   . Stroke   . Asthma   . Shortness of breath   . DEMENTIA   . CHF (congestive heart failure)   . GERD (gastroesophageal reflux disease)   . Anxiety   . COPD (chronic obstructive pulmonary disease)   . STROKE 04/14/2007    Current Outpatient Prescriptions  Medication Sig Dispense Refill  . albuterol (PROVENTIL HFA;VENTOLIN HFA) 108 (90 BASE) MCG/ACT inhaler Inhale 2 puffs into the lungs every 6 (six) hours as needed. For shortness of breath/wheezing      . ALPRAZolam (XANAX) 0.25 MG tablet Take 1 tablet (0.25 mg total) by mouth daily as needed. For anxiety  30 tablet  0  . atorvastatin (LIPITOR) 20 MG tablet Take 10 mg by mouth every other day.      . chlorpheniramine-HYDROcodone (TUSSIONEX) 10-8 MG/5ML LQCR Take 5 mLs by mouth every 12 (twelve) hours as needed.  140 mL  0  . Fluticasone-Salmeterol (ADVAIR DISKUS) 250-50 MCG/DOSE AEPB Inhale 1 puff into the lungs 2 (two) times daily.       Marland Kitchen  guaiFENesin-dextromethorphan ([name] DM) 100-10 MG/5ML syrup Take 5 mLs by mouth every 4 (four) hours as needed for cough.  118 mL  0  . levothyroxine (SYNTHROID, LEVOTHROID) 25 MCG tablet Take 25 mcg by mouth daily.       . metoprolol (TOPROL-XL) 100 MG 24 hr tablet Take 100 mg by mouth daily.       . predniSONE (DELTASONE) 10 MG tablet Prednisone 30 mg daily for 2 days followe dby  Prednisone 20 mg daily for 3 days followed by Prednisone 10 mg daily for 3 more days.  15 tablet  0  . propafenone (RYTHMOL) 150 MG tablet Take 150 mg by mouth every 8 (eight) hours.      . senna-docusate (SENOKOT-S)  8.6-50 MG per tablet Take 2 tablets by mouth at bedtime.  30 tablet  0  . temazepam (RESTORIL) 15 MG capsule Take 15 mg by mouth at bedtime.       Marland Kitchen tiotropium (SPIRIVA) 18 MCG inhalation capsule Place 18 mcg into inhaler and inhale daily.       Marland Kitchen warfarin (COUMADIN) 2 MG tablet Take 2-4 mg by mouth daily. 2mg  on Monday, Wednesday, Friday. 4mg  on Tuesday, Thursday, Saturday and Sunday       No current facility-administered medications for this visit.    Allergies:    Allergies  Allergen Reactions  . Ciprofloxacin     unknown  . Quinolones     unknown  . Sulfonamide Derivatives     unknown  . Zolpidem Tartrate     Hallucinations    Social History:  The patient  reports that she has never smoked. She does not have any smokeless tobacco history on file. She reports that she does not drink alcohol or use illicit drugs.   ROS:  Please see the history of present illness.    All other systems reviewed and negative.   PHYSICAL EXAM: VS:  BP 132/70  Pulse 114  Ht 5\' 1"  (1.549 m)  Wt 108 lb 1.9 oz (49.043 kg)  BMI 20.44 kg/m2 Well nourished, well developed, in no acute distress HEENT: normal Neck: no JVD at 90 Cardiac:  normal S1, S2; regularly irregular rhythm; no murmur Lungs:  clear to auscultation bilaterally, no wheezing, rhonchi or rales Abd: soft, nontender Ext: no edema Skin: warm and dry Neuro:  CNs 2-12 intact, no focal abnormalities noted  EKG:  Atrial fibrillation, HR 114, LAD, inferolateral ST abnormality     ASSESSMENT AND PLAN:  1. Atrial Fibrillation: It is difficult to know how symptomatic she is with this. I reviewed her case today with Dr. Antoine Poche.  She did have a questionable mass versus thrombus on her transthoracic echocardiogram. Her INR was sub therapeutic while she was in the hospital. We will pursue cardioversion. She will require TEE guided cardioversion. This will be set up later this week. We will have her INR checked the day prior. She will continue  Rythmol.  I will increase her Toprol to 100 mg the morning and 50 mg in the evening until the time of her cardioversion for better rate control. 2. Hypertension:  Controlled. 3. Diastolic CHF:  Volume stable. Continue current Rx. 4. Disposition:  Follow up with Dr. Rollene Rotunda in 3-4 weeks.   Signed, Tereso Newcomer, PA-C  12:00 PM 07/14/2012

## 2012-07-14 NOTE — Telephone Encounter (Signed)
Message copied by Tarri Fuller on Tue Jul 14, 2012  5:54 PM ------      Message from: [place], [place] T      Created: Tue Jul 14, 2012  5:07 PM       Hgb ok      K+ ok      Renal fxn stable.      Continue with current treatment plan.      Tereso Newcomer, PA-C  5:07 PM 07/14/2012 ------

## 2012-07-14 NOTE — Patient Instructions (Addendum)
Your physician has requested that you have a TEE/Cardioversion THIS IS A TEE DCCV WITH DR. Gala Romney ON 07/17/12 @ 1 PM. During a TEE, sound waves are used to create images of your heart. It provides your doctor with information about the size and shape of your heart and how well your heart's chambers and valves are working. In this test, a transducer is attached to the end of a flexible tube that is guided down you throat and into your esophagus (the tube leading from your mouth to your stomach) to get a more detailed image of your heart. Once the TEE has determined that a blood clot is not present, the cardioversion begins. Electrical Cardioversion uses a jolt of electricity to your heart either through paddles or wired patches attached to your chest. This is a controlled, usually prescheduled, procedure. This procedure is done at the hospital and you are not awake during the procedure. You usually go home the day of the procedure. Please see the instruction sheet given to you today for more information.  LAB TODAY BMET, CBC W/DIFF  PLEASE HAVE [facility] RECHECK INR ON 07/16/12 INSTEAD OF 07/15/12 SINCE YOU ARE HAVING YOUR PROCEDURE ON 07/17/12 PER [facility] WEAVER, PAC AND CALL [phone] WITH RESULTS OF INR AND FAX RESULTS OF INR TO SCOTT WEAVER, PAC [phone]  INCREASE TOPROL XL TO 100 MG IN THE MORNING AND 50 MG IN THE PM; AS OF THE NIGHT BEFORE CARDIOVERSION STOP THE 50 MG DOSE AND JUST CONTINUE WITH THE 100 MG IN THE MORNING.  PLEASE FOLLOW UP WITH DR. HOCHREIN 3-4 WEEKS IN THE [place] OFFICE

## 2012-07-14 NOTE — Telephone Encounter (Signed)
lmom labs stable 

## 2012-07-15 ENCOUNTER — Ambulatory Visit: Payer: Medicare Other | Admitting: Cardiology

## 2012-07-16 ENCOUNTER — Ambulatory Visit: Payer: Medicare Other | Admitting: Physician Assistant

## 2012-07-16 ENCOUNTER — Ambulatory Visit: Payer: Medicare Other | Admitting: Cardiology

## 2012-07-16 ENCOUNTER — Encounter: Payer: Self-pay | Admitting: Cardiology

## 2012-07-16 ENCOUNTER — Ambulatory Visit (INDEPENDENT_AMBULATORY_CARE_PROVIDER_SITE_OTHER): Payer: Medicare Other | Admitting: Internal Medicine

## 2012-07-16 ENCOUNTER — Encounter: Payer: Self-pay | Admitting: Internal Medicine

## 2012-07-16 VITALS — BP 110/58 | HR 106 | Temp 97.8°F | Ht 60.0 in | Wt 110.2 lb

## 2012-07-16 DIAGNOSIS — J449 Chronic obstructive pulmonary disease, unspecified: Secondary | ICD-10-CM

## 2012-07-16 DIAGNOSIS — I4891 Unspecified atrial fibrillation: Secondary | ICD-10-CM

## 2012-07-16 DIAGNOSIS — J4489 Other specified chronic obstructive pulmonary disease: Secondary | ICD-10-CM

## 2012-07-16 NOTE — Telephone Encounter (Signed)
Per daughter - INR at [facility] is 1.4.  TEE/Cardioversion scheduled will need to be rescheduled once she has been therapeutic for 4 weeks.  Daughter is aware.  Called [facility] and spoke with Cicero Duck (nurse) to verify that they will continue Metoprolol dose of 50 mg in the evening.   She states they will continue it as ordered.  Daughter aware pt will need to have PT/INR's checked weekly and remain above 2.0 for 4 weeks.  She will have INR's checked at [facility].  Pt has an appointment to follow up 3/19 in [place] office with Dr Antoine Poche.

## 2012-07-16 NOTE — Telephone Encounter (Signed)
New Problem   Pts daughter calling on behalf of her. She states pt is supposed to have a procedure tomorrow, however coumadin levels have dropped to 1.4 and is wondering what her mother should do moving forward. Would like to speak to nurse.

## 2012-07-16 NOTE — Patient Instructions (Addendum)
Stay on advair and spiriva daily automatically   Only use your albuterol (proaire) as a rescue medication to be used if you can't catch your breath by resting or doing a relaxed purse lip breathing pattern. The less you use it, the better it will work when you need it.   If breathing starts to get worse on higher doses of toprol and you start needing rescue inhaler need to consider changing toprol to bisoprolol to prevent spillover   Please schedule a follow up visit in 3 months but call sooner if needed

## 2012-07-16 NOTE — Progress Notes (Signed)
Subjective:    Patient ID: Julie Rich, female    DOB: 1922/04/30    MRN: [id number]  HPI  53 yowf never smoker  with sob/ cough starting around 2000 & stable pulm nodules for FU  03/2007 FEV1 49%, FVC 82%  second hand smoke exposure - ? chronic bronchitis vs late onset asthma.  stable BL indeterminate nodules since 5/08.  Complex cyst in liver on MRI 1/09 likley benign.    10/09 has done well from breathing standpt , on advair once daily & spiriva  episodes of flushing, tachycardia & hypertension lasting a few hrs - maxzide started by dr perini  spirometry 10/09>> FEV1 34%, FEV1 % 58, FVC 54%   May 25, 2009   PFTs - improved - FEV1 59%, FVC 79%,   DLCO 68%     04/02/2011  Julie Rich  tx to [facility] cc doe x [employer]  Worse with extremes of cold or heat and when goes out does so  at a slow pace.  C/o sev months of L cp x few seconds positional in nature, minimal dry cough and hoarseness.  rec Work on inhaler technique:   Stay as active as you can See if using your ventolin improves your activity tolerance   Admit date: 06/27/2012  Discharge date: 07/03/2012  1.   Discharge Diagnoses:  Principal Problem:  *COPD exacerbation  Active Problems:  HYPERLIPIDEMIA  HYPERTENSION  Atrial fibrillation  STROKE  Atrial flutter  Diastolic CHF, chronic    D/C 07/03/12 to [facility]   07/16/2012 f/u ov/Julie Rich cc back to baseline doe but not really limited from desired activities.  No obvious daytime variabilty or assoc chronic cough or cp or chest tightness, subjective wheeze overt sinus or hb symptoms. No unusual exp hx    Sleeping ok without nocturnal  or early am exacerbation  of respiratory  c/o's or need for noct saba. Also denies any obvious fluctuation of symptoms with weather or environmental changes or other aggravating or alleviating factors except as outlined above   ROS  At present neg for  any significant sore throat, dysphagia, itching, sneezing,  nasal  congestion or excess/ purulent secretions,  fever, chills, sweats, unintended wt loss, pleuritic or exertional cp, hempoptysis, orthopnea pnd or leg swelling.  Also denies presyncope, palpitations, heartburn, abdominal pain, nausea, vomiting, diarrhea  or change in bowel or urinary habits, dysuria,hematuria,  rash, arthralgias, visual complaints, headache, numbness weakness or ataxia.   ROS  The following are not active complaints unless bolded sore throat, dysphagia, dental problems, itching, sneezing,  nasal congestion or excess/ purulent secretions, ear ache,   fever, chills, sweats, unintended wt loss, pleuritic or exertional cp, hemoptysis,  orthopnea pnd or leg swelling, presyncope, palpitations, heartburn, abdominal pain, anorexia, nausea, vomiting, diarrhea  or change in bowel or urinary habits, change in stools or urine, dysuria,hematuria,  rash, arthralgias, visual complaints, headache, numbness weakness or ataxia or problems with walking or coordination,  change in mood/affect or memory.      Past Medical History:  Hypertension  Atrial Fibrillation       Objective:   Physical Exam   wt 124 dec '10 119 March 30, 2010 > 04/02/2011  120 > 07/16/2012  110 Gen. Pleasant, moderately frairl amb wf in no distress  ENT - no lesions, no post nasal drip  Neck: No JVD, no thyromegaly, no carotid bruits  Lungs: no use of accessory muscles, no dullness to percussion, clear without rales or  rhonchi  Cardiovascular: Rhythm regular, heart sounds normal, no murmurs or gallops, no peripheral edema  Musculoskeletal: No deformities, no cyanosis or clubbing   07/01/12  Comparison: 06/27/2012  Findings: Hyperexpansion with attenuation of peripheral lung  markings suggests emphysema. No edema or focal airspace  consolidation. The cardiopericardial silhouette is enlarged.  Probable tiny left pleural effusion. Imaged bony structures of the  thorax are intact.  IMPRESSION:  Cardiomegaly with emphysema  and probable small left pleural  effusion.       Assessment & Plan:

## 2012-07-17 ENCOUNTER — Ambulatory Visit (HOSPITAL_COMMUNITY): Admission: RE | Admit: 2012-07-17 | Payer: Medicare Other | Source: Ambulatory Visit | Admitting: Cardiology

## 2012-07-17 ENCOUNTER — Encounter (HOSPITAL_COMMUNITY): Admission: RE | Payer: Self-pay | Source: Ambulatory Visit

## 2012-07-17 SURGERY — ECHOCARDIOGRAM, TRANSESOPHAGEAL
Anesthesia: Moderate Sedation

## 2012-07-17 NOTE — Progress Notes (Signed)
INR too low to proceed with TEE-DCCV. Cancel test (this has already been done). She needs coumadin adjusted and procedure can be rescheduled once her INR is therapeuti (INR > 2). Tereso Newcomer, PA-C  12:23 PM 07/17/2012

## 2012-07-17 NOTE — Telephone Encounter (Signed)
She can have a DCCV only after her INR has been greater than 2 for 3 wks straight.  However, she can have a TEE/DCCV scheduled at any time once her INR is above 2.  The TEE obviates the need for three weeks of anticoagulation prior to cardioversion.

## 2012-07-18 NOTE — Assessment & Plan Note (Signed)
Adequate control on present rx, reviewed rx    Each maintenance medication was reviewed in detail including most importantly the difference between maintenance and as needed and under what circumstances the prns are to be used.  Please see instructions for details which were reviewed in writing and the patient given a copy.

## 2012-07-18 NOTE — Assessment & Plan Note (Signed)
Concerned that using high doses of B blockers could be problematic but so far so good.  IF airways become unstable on her chronic maint meds, Strongly prefer in this setting  bisoprolol the most selective generic choice  on the market.

## 2012-07-20 ENCOUNTER — Encounter: Payer: Self-pay | Admitting: Cardiology

## 2012-07-20 ENCOUNTER — Ambulatory Visit (INDEPENDENT_AMBULATORY_CARE_PROVIDER_SITE_OTHER): Payer: Medicare Other | Admitting: Cardiology

## 2012-07-20 VITALS — BP 160/84 | HR 112 | Wt 110.0 lb

## 2012-07-20 DIAGNOSIS — I4891 Unspecified atrial fibrillation: Secondary | ICD-10-CM

## 2012-07-20 MED ORDER — DILTIAZEM HCL 30 MG PO TABS: 30.0000 mg | ORAL_TABLET | ORAL | Status: DC | PRN

## 2012-07-20 NOTE — Patient Instructions (Addendum)
Please take Cardizem 30 mg as needed for increased heart rate Continue all other medications as listed  Please have INR/PT checked once weekly.  Once results is above 2.0 you may be scheduled for a TEE guided cardioversion.  Keep appointment with Dr Antoine Poche as scheduled.

## 2012-07-20 NOTE — Telephone Encounter (Signed)
Pt being added into schedule for today per Dr Antoine Poche

## 2012-07-20 NOTE — Telephone Encounter (Signed)
Left message to call back - f/u on what HR was at nursing facility today when they checked it

## 2012-07-20 NOTE — Telephone Encounter (Signed)
New Problem    Pt is being discharged from nursing home today. Has some questions regarding her heart rate and coumadin. Would like to speak to nurse.

## 2012-07-20 NOTE — Progress Notes (Signed)
HPI She has a history of diastolic CHF and atrial fibrillation.  She is on Coumadin. She was previously on flecainide but remains on Rythmol now. She was admitted 2/1-2/7. She presented with worsening dyspnea and cough with noted hypoxemia. She was treated with Solu-Medrol, prednisone and bronchodilators for her COPD exacerbation. She developed recurrent atrial fibrillation and was seen by cardiology. Rythmol was continued.  Echo 2/14 demonstrated mild LVH, EF 55-60%, normal wall motion, mild AI, mild MR, mild LAE, trivial pericardial effusion. There was an echodensity noted either in the LAA or just distal to the pulmonic valve with a question thrombus.S he was covered with heparin until her INR became therapeutic. Rate was controlled. It was thought that her acute COPD exacerbation may have exacerbated her atrial fibrillation. She was discharged from rehabilitation in a nursing home across the street this afternoon.  Her daughter brought her over to be seen as she was concerned about her heart rate.  She was added to my schedule.   Allergies  Allergen Reactions  . Ciprofloxacin     unknown  . Quinolones     unknown  . Sulfonamide Derivatives     unknown  . Zolpidem Tartrate     Hallucinations    Current Outpatient Prescriptions  Medication Sig Dispense Refill  . albuterol (PROVENTIL HFA;VENTOLIN HFA) 108 (90 BASE) MCG/ACT inhaler Inhale 2 puffs into the lungs every 6 (six) hours as needed. For shortness of breath/wheezing      . ALPRAZolam (XANAX) 0.25 MG tablet Take 1 tablet (0.25 mg total) by mouth daily as needed. For anxiety  30 tablet  0  . atorvastatin (LIPITOR) 20 MG tablet Take 10 mg by mouth every other day.      . chlorpheniramine-HYDROcodone (TUSSIONEX) 10-8 MG/5ML LQCR Take 5 mLs by mouth every 12 (twelve) hours as needed.  140 mL  0  . Fluticasone-Salmeterol (ADVAIR DISKUS) 250-50 MCG/DOSE AEPB Inhale 1 puff into the lungs 2 (two) times daily.       Marland Kitchen  guaiFENesin-dextromethorphan ([name] DM) 100-10 MG/5ML syrup Take 5 mLs by mouth every 4 (four) hours as needed for cough.  118 mL  0  . levothyroxine (SYNTHROID, LEVOTHROID) 25 MCG tablet Take 25 mcg by mouth daily.       . metoprolol succinate (TOPROL-XL) 100 MG 24 hr tablet 100 MG IN THE AM; 50 MG IN THE PM      . propafenone (RYTHMOL) 150 MG tablet Take 150 mg by mouth every 8 (eight) hours.      . senna-docusate (SENOKOT-S) 8.6-50 MG per tablet Take 2 tablets by mouth at bedtime.  30 tablet  0  . temazepam (RESTORIL) 15 MG capsule Take 15 mg by mouth at bedtime.       Marland Kitchen tiotropium (SPIRIVA) 18 MCG inhalation capsule Place 18 mcg into inhaler and inhale daily.       Marland Kitchen warfarin (COUMADIN) 2 MG tablet Take 2-4 mg by mouth daily. As directed       No current facility-administered medications for this visit.    Past Medical History  Diagnosis Date  . HTN (hypertension)   . A-fib   . Dyslipidemia   . Hypothyroidism   . Stroke   . Asthma   . Shortness of breath   . DEMENTIA   . CHF (congestive heart failure)   . GERD (gastroesophageal reflux disease)   . Anxiety   . COPD (chronic obstructive pulmonary disease)   . STROKE 04/14/2007    Past Surgical  History  Procedure Laterality Date  . Total abdominal hysterectomy    . Breast surgery      45-50 YEARS AGO LUMP REMOVED  . Appendectomy    . Cardioversion  06/26/2011    Procedure: CARDIOVERSION;  Surgeon: Marca Ancona, MD;  Location: [facility] OR;  Service: Cardiovascular;  Laterality: N/A;    ROS:  As stated in the HPI and negative for all other systems.  PHYSICAL EXAM BP 160/84  Pulse 112  Wt 110 lb (49.896 kg)  BMI 21.48 kg/m2 GENERAL:  Well appearing HEENT:  Pupils equal round and reactive, fundi not visualized, oral mucosa unremarkable NECK:  No jugular venous distention, waveform within normal limits, carotid upstroke brisk and symmetric, no bruits, no thyromegaly LYMPHATICS:  No cervical, inguinal adenopathy LUNGS:   Clear to auscultation bilaterally BACK:  No CVA tenderness CHEST:  Unremarkable HEART:  PMI not displaced or sustained,S1 and S2 within normal limits, no S3, no clicks, no rubs, no murmurs, irregular ABD:  Flat, positive bowel sounds normal in frequency in pitch, no bruits, no rebound, no guarding, no midline pulsatile mass, no hepatomegaly, no splenomegaly EXT:  2 plus pulses throughout, no edema, no cyanosis no clubbing SKIN:  No rashes no nodules NEURO:  Cranial nerves II through XII grossly intact, motor grossly intact throughout PSYCH:  Cognitively intact, oriented to person place and time   EKG:   Atrial fibrillation, rate 114, probable old anteroseptal infarct, no acute ST-T wave changes. 07/20/2012  ASSESSMENT AND PLAN  Atrial fibrillation The patient's rate does go up slightly at night. She gets anxious. We discussed taking Xanax prior to this to try to avoid it. She could also take 30 mg of Cardizem with his increased heart rate. The plan will be however to cardiovert her. Once her INR is above 2 we will plan a TEE cardioversion. Of note she would like to have it scheduled. The day of the procedure she would get her INR checked in [facility] to make sure that her INR is greater than 2 prior to driving to [facility].  Diastolic heart failure She seems to be euvolemic. She will continue the meds as listed.  HTN Her blood pressure is slightly high today but previous readings that demonstrated it to be well controlled. Her daughter will keep an eye on this and let me know if it trends upward.

## 2012-07-20 NOTE — Telephone Encounter (Signed)
Follow up     Pt was waiting to hear back from you about EKG in office for today. Pt is being discharged at 3pm today. Need to know something ASAP. Can also call cell [phone] if call after 2:30

## 2012-07-21 ENCOUNTER — Telehealth: Payer: Self-pay | Admitting: Cardiology

## 2012-07-21 DIAGNOSIS — Z01812 Encounter for preprocedural laboratory examination: Secondary | ICD-10-CM

## 2012-07-21 DIAGNOSIS — I4892 Unspecified atrial flutter: Secondary | ICD-10-CM

## 2012-07-21 NOTE — Telephone Encounter (Signed)
Line busy times two.

## 2012-07-21 NOTE — Telephone Encounter (Signed)
Daughter aware will attempt to schedule TEE guided cardioversion for Friday with Dr Tenny Craw.  Aware we will call and let her know if and when we get it scheduled. Requested PT/INR be faxed from [facility]

## 2012-07-21 NOTE — Telephone Encounter (Signed)
Dr Antoine Poche verbally aware and procedure will be scheduled.

## 2012-07-21 NOTE — Telephone Encounter (Signed)
Pt inr is at 2.1 and they would like to set up the cardioversion asap

## 2012-07-22 ENCOUNTER — Encounter: Payer: Self-pay | Admitting: *Deleted

## 2012-07-22 NOTE — Telephone Encounter (Signed)
Spoke with patients daughter, Lora Havens, to verify upcoming procedure (TEE Cardioversion) scheduled for 07/24/2012 at 10am with Dr. Tenny Craw. Reviewed instructions, labs status of CBC/BMET/INR and medication status prior to procedure. CBC and BMET current, INR being drawn 2/27 by home health and results will be faxed to [place] on 2/27 by Tammy at [facility].

## 2012-07-23 ENCOUNTER — Encounter: Payer: Self-pay | Admitting: Cardiology

## 2012-07-23 LAB — PROTIME-INR: INR: 2.4 — AB (ref 0.9–1.1)

## 2012-07-24 ENCOUNTER — Encounter (HOSPITAL_COMMUNITY)
Admission: RE | Disposition: A | Discharge status: Home / Self Care | Payer: Self-pay | Source: Ambulatory Visit | Attending: Internal Medicine

## 2012-07-24 ENCOUNTER — Ambulatory Visit (HOSPITAL_COMMUNITY)
Admission: RE | Admit: 2012-07-24 | Discharge: 2012-07-24 | Disposition: A | Discharge status: Home / Self Care | Payer: Medicare Other | Source: Ambulatory Visit | Attending: Internal Medicine | Admitting: Internal Medicine

## 2012-07-24 ENCOUNTER — Encounter (HOSPITAL_COMMUNITY): Payer: Self-pay | Admitting: Certified Registered"

## 2012-07-24 ENCOUNTER — Ambulatory Visit (HOSPITAL_COMMUNITY): Payer: Medicare Other | Admitting: Certified Registered"

## 2012-07-24 DIAGNOSIS — F0390 Unspecified dementia without behavioral disturbance: Secondary | ICD-10-CM | POA: Diagnosis present

## 2012-07-24 DIAGNOSIS — I4891 Unspecified atrial fibrillation: Secondary | ICD-10-CM | POA: Diagnosis present

## 2012-07-24 DIAGNOSIS — I5033 Acute on chronic diastolic (congestive) heart failure: Principal | ICD-10-CM | POA: Diagnosis present

## 2012-07-24 DIAGNOSIS — R5381 Other malaise: Secondary | ICD-10-CM | POA: Diagnosis present

## 2012-07-24 DIAGNOSIS — Z7901 Long term (current) use of anticoagulants: Secondary | ICD-10-CM

## 2012-07-24 DIAGNOSIS — E039 Hypothyroidism, unspecified: Secondary | ICD-10-CM | POA: Diagnosis present

## 2012-07-24 DIAGNOSIS — I509 Heart failure, unspecified: Secondary | ICD-10-CM | POA: Diagnosis present

## 2012-07-24 DIAGNOSIS — R079 Chest pain, unspecified: Secondary | ICD-10-CM | POA: Diagnosis present

## 2012-07-24 DIAGNOSIS — I739 Peripheral vascular disease, unspecified: Secondary | ICD-10-CM | POA: Diagnosis present

## 2012-07-24 DIAGNOSIS — F411 Generalized anxiety disorder: Secondary | ICD-10-CM | POA: Diagnosis present

## 2012-07-24 DIAGNOSIS — J441 Chronic obstructive pulmonary disease with (acute) exacerbation: Secondary | ICD-10-CM | POA: Diagnosis present

## 2012-07-24 DIAGNOSIS — E785 Hyperlipidemia, unspecified: Secondary | ICD-10-CM | POA: Diagnosis present

## 2012-07-24 DIAGNOSIS — Z8673 Personal history of transient ischemic attack (TIA), and cerebral infarction without residual deficits: Secondary | ICD-10-CM

## 2012-07-24 DIAGNOSIS — K219 Gastro-esophageal reflux disease without esophagitis: Secondary | ICD-10-CM | POA: Diagnosis present

## 2012-07-24 DIAGNOSIS — I10 Essential (primary) hypertension: Secondary | ICD-10-CM | POA: Diagnosis present

## 2012-07-24 DIAGNOSIS — E876 Hypokalemia: Secondary | ICD-10-CM | POA: Diagnosis not present

## 2012-07-24 HISTORY — PX: CARDIOVERSION: SHX1299

## 2012-07-24 HISTORY — PX: TEE WITHOUT CARDIOVERSION: SHX5443

## 2012-07-24 SURGERY — ECHOCARDIOGRAM, TRANSESOPHAGEAL
Anesthesia: General

## 2012-07-24 MED ORDER — FENTANYL CITRATE 0.05 MG/ML IJ SOLN
INTRAMUSCULAR | Status: DC | PRN
  Administered 2012-07-24: 25 ug via INTRAVENOUS

## 2012-07-24 MED ORDER — MIDAZOLAM HCL 10 MG/2ML IJ SOLN
INTRAMUSCULAR | Status: DC | PRN
  Administered 2012-07-24: 1 mg via INTRAVENOUS
  Administered 2012-07-24: 2 mg via INTRAVENOUS
  Administered 2012-07-24: 1 mg via INTRAVENOUS

## 2012-07-24 MED ORDER — MIDAZOLAM HCL 5 MG/ML IJ SOLN
INTRAMUSCULAR | Status: AC
  Filled 2012-07-24: qty 1

## 2012-07-24 MED ORDER — FENTANYL CITRATE 0.05 MG/ML IJ SOLN
INTRAMUSCULAR | Status: AC
  Filled 2012-07-24: qty 2

## 2012-07-24 MED ORDER — LIDOCAINE HCL (CARDIAC) 20 MG/ML IV SOLN
INTRAVENOUS | Status: DC | PRN
  Administered 2012-07-24: 50 mg via INTRAVENOUS

## 2012-07-24 MED ORDER — LIDOCAINE VISCOUS 2 % MT SOLN
OROMUCOSAL | Status: DC | PRN
  Administered 2012-07-24: 20 mL via OROMUCOSAL

## 2012-07-24 MED ORDER — PROPOFOL 10 MG/ML IV BOLUS
INTRAVENOUS | Status: DC | PRN
  Administered 2012-07-24: 80 mg via INTRAVENOUS

## 2012-07-24 MED ORDER — EPHEDRINE 5 MG/ML INJ
INTRAVENOUS | Status: DC | PRN
  Administered 2012-07-24: 10 mg via INTRAVENOUS

## 2012-07-24 MED ORDER — SODIUM CHLORIDE 0.9 % IV SOLN
INTRAVENOUS | Status: DC
  Administered 2012-07-24: 500 mL via INTRAVENOUS
  Administered 2012-07-24: 12:00:00 via INTRAVENOUS

## 2012-07-24 MED ORDER — LIDOCAINE VISCOUS 2 % MT SOLN
OROMUCOSAL | Status: AC
  Filled 2012-07-24: qty 15

## 2012-07-24 NOTE — Anesthesia Postprocedure Evaluation (Signed)
  Anesthesia Post-op Note  Patient: Julie Rich  Procedure(s) Performed: Procedure(s): TRANSESOPHAGEAL ECHOCARDIOGRAM (TEE) (N/A) CARDIOVERSION (N/A)  Patient Location: PACU  Anesthesia Type:General  Level of Consciousness: awake  Airway and Oxygen Therapy: Patient Spontanous Breathing  Post-op Pain: none  Post-op Assessment: Post-op Vital signs reviewed  Post-op Vital Signs: Reviewed  Complications: No apparent anesthesia complications

## 2012-07-24 NOTE — Op Note (Signed)
Patient anesthetized with 80 mg propofol  With pads in AP position patient cardioverted with 150 J synchronized biphasic energy to SR.

## 2012-07-24 NOTE — Progress Notes (Signed)
  Echocardiogram Echocardiogram Transesophageal has been performed.  Julie Rich 07/24/2012, 12:37 PM

## 2012-07-24 NOTE — Discharge Instructions (Addendum)
Transesophageal Echocardiogram Care After Refer to this sheet in the next few weeks. These instructions provide you with information on caring for yourself after your procedure. Your caregiver may also give you more specific instructions. Your treatment has been planned according to current medical practices, but problems sometimes occur. Call your caregiver if you have any problems or questions after your procedure. HOME CARE INSTRUCTIONS  If you were given medicine to help you relax (sedative), do not drive, operate machinery, or sign important documents for 24 hours.  Avoid alcohol and hot or warm beverages for the first 24 hours after the procedure.  Only take over-the-counter or prescription medicines for pain, discomfort, or fever as directed by your caregiver. You may resume taking your normal medicines unless your caregiver tells you otherwise. Ask your caregiver when you may resume taking medicines that may cause bleeding, such as aspirin, clopidogrel, or warfarin.  You may return to your normal diet and activities on the day after your procedure, or as directed by your caregiver. Walking may help to reduce any bloated feeling in your abdomen.  Drink enough fluids to keep your urine clear or pale yellow.  You may gargle with salt water if you have a sore throat. SEEK IMMEDIATE MEDICAL CARE IF:  You have severe nausea or vomiting.  You have severe abdominal pain, abdominal cramps that last longer than 6 hours, or abdominal swelling (distention).  You have severe shoulder or back pain.  You have trouble swallowing.  You have shortness of breath, your breathing is shallow, or you are breathing faster than normal.  You have a fever or a rapid heartbeat.  You vomit blood or material that looks like coffee grounds.  You have bloody, black, or tarry stools. MAKE SURE YOU:  Understand these instructions.  Will watch your condition.  Will get help right away if you are not  doing well or get worse. Document Released: 12/26/2003 Document Revised: 11/12/2011 Document Reviewed: 08/13/2011 [facility] Patient Information 2013 [place], [place].

## 2012-07-24 NOTE — Interval H&P Note (Signed)
History and Physical Interval Note:  07/24/2012 10:59 AM  Julie Rich  has presented today for surgery, with the diagnosis of a- flutter  The various methods of treatment have been discussed with the patient and family. After consideration of risks, benefits and other options for treatment, the patient has consented to  Procedure(s): TRANSESOPHAGEAL ECHOCARDIOGRAM (TEE) (N/A) CARDIOVERSION (N/A) as a surgical intervention .  The patient's history has been reviewed, patient examined, no change in status, stable for surgery.  I have reviewed the patient's chart and labs.  Questions were answered to the patient's satisfaction.     [name]

## 2012-07-24 NOTE — Op Note (Signed)
Full report under CV section of chart.   Prelim:  No LA or LAA mass present.

## 2012-07-24 NOTE — Transfer of Care (Signed)
Immediate Anesthesia Transfer of Care Note  Patient: Julie Rich  Procedure(s) Performed: Procedure(s): TRANSESOPHAGEAL ECHOCARDIOGRAM (TEE) (N/A) CARDIOVERSION (N/A)  Patient Location: Endoscopy Unit  Anesthesia Type:General  Level of Consciousness: sedated and patient cooperative  Airway & Oxygen Therapy: Patient Spontanous Breathing and Patient connected to nasal cannula oxygen  Post-op Assessment: Report given to PACU RN, Post -op Vital signs reviewed and stable and Patient moving all extremities  Post vital signs: Reviewed and stable  Complications: No apparent anesthesia complications

## 2012-07-24 NOTE — Anesthesia Preprocedure Evaluation (Addendum)
Anesthesia Evaluation  Patient identified by MRN, date of birth, ID band  Airway Mallampati: II TM Distance: >3 FB Neck ROM: Full    Dental  (+) Missing   Pulmonary asthma , COPD COPD inhaler,  breath sounds clear to auscultation        Cardiovascular hypertension, Pt. on home beta blockers and Pt. on medications + Peripheral Vascular Disease and +CHF + dysrhythmias Atrial Fibrillation Rhythm:Regular Rate:Normal     Neuro/Psych Anxiety CVA, No Residual Symptoms    GI/Hepatic GERD-  ,  Endo/Other  Hypothyroidism   Renal/GU      Musculoskeletal   Abdominal   Peds  Hematology   Anesthesia Other Findings   Reproductive/Obstetrics                          Anesthesia Physical Anesthesia Plan  ASA: IV  Anesthesia Plan: General   Post-op Pain Management:    Induction: Intravenous  Airway Management Planned: Mask  Additional Equipment:   Intra-op Plan:   Post-operative Plan:   Informed Consent: I have reviewed the patients History and Physical, chart, labs and discussed the procedure including the risks, benefits and alternatives for the proposed anesthesia with the patient or authorized representative who has indicated his/her understanding and acceptance.     Plan Discussed with: CRNA, Anesthesiologist and Surgeon  Anesthesia Plan Comments:         Anesthesia Quick Evaluation

## 2012-07-24 NOTE — Preoperative (Signed)
Beta Blockers   Reason not to administer Beta Blockers:Not Applicable 

## 2012-07-24 NOTE — H&P (View-Only) (Signed)
History and Physical  Date:  07/14/2012   ID:  Julie Rich, DOB 09/20/1921, MRN [id number]  PCP:  PERINI,MARK A, MD  Primary Cardiologist:  Dr. James Hochrein   Primary Electrophysiologist:  Dr. Gregg Taylor    History of Present Illness: Julie Rich is a 79 y.o. female who returns for followup after recent admission to the hospital with atrial fibrillation in the setting of COPD exacerbation.  She has a history of diastolic CHF, atrial fibrillation, SVT, HTN, HL, COPD, hypothyroidism, GERD, anxiety. She is on Coumadin. She was previously on flecainide but remains on Rythmol now.  Echo 2/14: Mild LVH, EF 55-60%, normal wall motion, mild AI, mild MR, mild LAE, trivial pericardial effusion. There was an echodensity noted either in the LAA or just distal to the pulmonic valve-question thrombus.  She was admitted 2/1-2/7. She presented with worsening dyspnea and cough with noted hypoxemia. She was treated with Solu-Medrol, prednisone and bronchodilators for her COPD exacerbation.  She developed recurrent atrial fibrillation and was seen by cardiology.  Rythmol was continued. Echo was done as noted above. She was covered with heparin until her INR became therapeutic.  Rate was controlled. It was thought that her acute COPD exacerbation may have exacerbated her atrial fibrillation. Cardioversion could be considered in the future if she did not revert back to sinus rhythm on her own.  She is currently residing at [facility]. She will be discharged next week.  She would like to pursue cardioversion prior to discharge as she lives in [place]. She remains in atrial fibrillation today by ECG. She does note dyspnea with exertion. This seems to be stable. She describes NYHA class IIb-III symptoms. She denies chest pain, syncope, near syncope, orthopnea, PND or edema. She does note occasional rapid palpitations.  Labs (2/14):  K 4.3, creatinine 1.43 => 1.07, ALT 14, Hgb 12.5, TSH 0.648   Lab Results    Component Value Date   INR 2.87* 07/03/2012   INR 2.06* 07/02/2012   INR 1.72* 07/01/2012    INR 07/05/12:    2.9 INR 07/08/12:  4.3 INR 07/10/12:  2.6  Wt Readings from Last 3 Encounters:  07/14/12 108 lb 1.9 oz (49.043 kg)  07/03/12 113 lb 9.6 oz (51.529 kg)  01/22/12 114 lb (51.71 kg)     Past Medical History  Diagnosis Date  . HTN (hypertension)   . A-fib   . Dyslipidemia   . Hypothyroidism   . Stroke   . Asthma   . Shortness of breath   . DEMENTIA   . CHF (congestive heart failure)   . GERD (gastroesophageal reflux disease)   . Anxiety   . COPD (chronic obstructive pulmonary disease)   . STROKE 04/14/2007    Current Outpatient Prescriptions  Medication Sig Dispense Refill  . albuterol (PROVENTIL HFA;VENTOLIN HFA) 108 (90 BASE) MCG/ACT inhaler Inhale 2 puffs into the lungs every 6 (six) hours as needed. For shortness of breath/wheezing      . ALPRAZolam (XANAX) 0.25 MG tablet Take 1 tablet (0.25 mg total) by mouth daily as needed. For anxiety  30 tablet  0  . atorvastatin (LIPITOR) 20 MG tablet Take 10 mg by mouth every other day.      . chlorpheniramine-HYDROcodone (TUSSIONEX) 10-8 MG/5ML LQCR Take 5 mLs by mouth every 12 (twelve) hours as needed.  140 mL  0  . Fluticasone-Salmeterol (ADVAIR DISKUS) 250-50 MCG/DOSE AEPB Inhale 1 puff into the lungs 2 (two) times daily.       .   guaiFENesin-dextromethorphan ([name] DM) 100-10 MG/5ML syrup Take 5 mLs by mouth every 4 (four) hours as needed for cough.  118 mL  0  . levothyroxine (SYNTHROID, LEVOTHROID) 25 MCG tablet Take 25 mcg by mouth daily.       . metoprolol (TOPROL-XL) 100 MG 24 hr tablet Take 100 mg by mouth daily.       . predniSONE (DELTASONE) 10 MG tablet Prednisone 30 mg daily for 2 days followe dby  Prednisone 20 mg daily for 3 days followed by Prednisone 10 mg daily for 3 more days.  15 tablet  0  . propafenone (RYTHMOL) 150 MG tablet Take 150 mg by mouth every 8 (eight) hours.      . senna-docusate (SENOKOT-S)  8.6-50 MG per tablet Take 2 tablets by mouth at bedtime.  30 tablet  0  . temazepam (RESTORIL) 15 MG capsule Take 15 mg by mouth at bedtime.       . tiotropium (SPIRIVA) 18 MCG inhalation capsule Place 18 mcg into inhaler and inhale daily.       . warfarin (COUMADIN) 2 MG tablet Take 2-4 mg by mouth daily. 2mg on Monday, Wednesday, Friday. 4mg on Tuesday, Thursday, Saturday and Sunday       No current facility-administered medications for this visit.    Allergies:    Allergies  Allergen Reactions  . Ciprofloxacin     unknown  . Quinolones     unknown  . Sulfonamide Derivatives     unknown  . Zolpidem Tartrate     Hallucinations    Social History:  The patient  reports that she has never smoked. She does not have any smokeless tobacco history on file. She reports that she does not drink alcohol or use illicit drugs.   ROS:  Please see the history of present illness.    All other systems reviewed and negative.   PHYSICAL EXAM: VS:  BP 132/70  Pulse 114  Ht 5' 1" (1.549 m)  Wt 108 lb 1.9 oz (49.043 kg)  BMI 20.44 kg/m2 Well nourished, well developed, in no acute distress HEENT: normal Neck: no JVD at 90 Cardiac:  normal S1, S2; regularly irregular rhythm; no murmur Lungs:  clear to auscultation bilaterally, no wheezing, rhonchi or rales Abd: soft, nontender Ext: no edema Skin: warm and dry Neuro:  CNs 2-12 intact, no focal abnormalities noted  EKG:  Atrial fibrillation, HR 114, LAD, inferolateral ST abnormality     ASSESSMENT AND PLAN:  1. Atrial Fibrillation: It is difficult to know how symptomatic she is with this. I reviewed her case today with Dr. Hochrein.  She did have a questionable mass versus thrombus on her transthoracic echocardiogram. Her INR was sub therapeutic while she was in the hospital. We will pursue cardioversion. She will require TEE guided cardioversion. This will be set up later this week. We will have her INR checked the day prior. She will continue  Rythmol.  I will increase her Toprol to 100 mg the morning and 50 mg in the evening until the time of her cardioversion for better rate control. 2. Hypertension:  Controlled. 3. Diastolic CHF:  Volume stable. Continue current Rx. 4. Disposition:  Follow up with Dr. James Hochrein in 3-4 weeks.   Signed, Toinette Lackie, PA-C  12:00 PM 07/14/2012    

## 2012-07-25 ENCOUNTER — Other Ambulatory Visit: Payer: Self-pay

## 2012-07-25 ENCOUNTER — Other Ambulatory Visit: Payer: Self-pay | Admitting: Adult Health

## 2012-07-25 ENCOUNTER — Telehealth: Payer: Self-pay | Admitting: Internal Medicine

## 2012-07-25 ENCOUNTER — Encounter (HOSPITAL_COMMUNITY): Payer: Self-pay

## 2012-07-25 ENCOUNTER — Emergency Department (HOSPITAL_COMMUNITY): Payer: Medicare Other

## 2012-07-25 ENCOUNTER — Encounter: Payer: Self-pay | Admitting: Internal Medicine

## 2012-07-25 ENCOUNTER — Inpatient Hospital Stay (HOSPITAL_COMMUNITY)
Admission: EM | Admit: 2012-07-25 | Discharge: 2012-08-03 | DRG: 292 | Disposition: A | Discharge status: Skilled Nursing Facility | Payer: Medicare Other | Attending: Internal Medicine | Admitting: Internal Medicine

## 2012-07-25 ENCOUNTER — Inpatient Hospital Stay (HOSPITAL_COMMUNITY): Payer: Medicare Other

## 2012-07-25 DIAGNOSIS — I5031 Acute diastolic (congestive) heart failure: Secondary | ICD-10-CM

## 2012-07-25 DIAGNOSIS — I4891 Unspecified atrial fibrillation: Secondary | ICD-10-CM

## 2012-07-25 DIAGNOSIS — Z7901 Long term (current) use of anticoagulants: Secondary | ICD-10-CM

## 2012-07-25 DIAGNOSIS — R5381 Other malaise: Secondary | ICD-10-CM | POA: Diagnosis present

## 2012-07-25 DIAGNOSIS — I5033 Acute on chronic diastolic (congestive) heart failure: Secondary | ICD-10-CM | POA: Diagnosis present

## 2012-07-25 DIAGNOSIS — R0602 Shortness of breath: Secondary | ICD-10-CM

## 2012-07-25 DIAGNOSIS — E785 Hyperlipidemia, unspecified: Secondary | ICD-10-CM

## 2012-07-25 DIAGNOSIS — I509 Heart failure, unspecified: Secondary | ICD-10-CM

## 2012-07-25 DIAGNOSIS — J441 Chronic obstructive pulmonary disease with (acute) exacerbation: Secondary | ICD-10-CM

## 2012-07-25 DIAGNOSIS — R079 Chest pain, unspecified: Secondary | ICD-10-CM | POA: Diagnosis present

## 2012-07-25 DIAGNOSIS — I10 Essential (primary) hypertension: Secondary | ICD-10-CM | POA: Diagnosis present

## 2012-07-25 DIAGNOSIS — J189 Pneumonia, unspecified organism: Secondary | ICD-10-CM

## 2012-07-25 LAB — COMPREHENSIVE METABOLIC PANEL
ALT: 56 U/L — ABNORMAL HIGH (ref 0–35)
AST: 40 U/L — ABNORMAL HIGH (ref 0–37)
Calcium: 9.8 mg/dL (ref 8.4–10.5)
GFR calc Af Amer: 37 mL/min — ABNORMAL LOW (ref >90)
Sodium: 140 mEq/L (ref 135–145)
Total Protein: 6.2 g/dL (ref 6.0–8.3)

## 2012-07-25 LAB — TROPONIN I: Troponin I: <0.3 ng/mL (ref <0.30)

## 2012-07-25 LAB — CBC WITH DIFFERENTIAL/PLATELET
Basophils Relative: 1 % (ref 0–1)
Eosinophils Absolute: 0.1 10*3/uL (ref 0.0–0.7)
MCH: 30.1 pg (ref 26.0–34.0)
MCHC: 32.5 g/dL (ref 30.0–36.0)
Neutrophils Relative %: 73 % (ref 43–77)
Platelets: 300 10*3/uL (ref 150–400)
RBC: 3.75 MIL/uL — ABNORMAL LOW (ref 3.87–5.11)
RDW: 15 % (ref 11.5–15.5)

## 2012-07-25 LAB — POCT I-STAT TROPONIN I

## 2012-07-25 LAB — PRO B NATRIURETIC PEPTIDE
Pro B Natriuretic peptide (BNP): 3124 pg/mL — ABNORMAL HIGH (ref 0–450)
Pro B Natriuretic peptide (BNP): 3522 pg/mL — ABNORMAL HIGH (ref 0–450)

## 2012-07-25 LAB — PROTIME-INR
INR: 3.84 — ABNORMAL HIGH (ref 0.00–1.49)
Prothrombin Time: 35.5 seconds — ABNORMAL HIGH (ref 11.6–15.2)

## 2012-07-25 MED ORDER — ONDANSETRON HCL 4 MG/2ML IJ SOLN: 4.0000 mg | Freq: Four times a day (QID) | INTRAMUSCULAR | Status: DC | PRN

## 2012-07-25 MED ORDER — ASPIRIN EC 81 MG PO TBEC
81.0000 mg | DELAYED_RELEASE_TABLET | Freq: Every day | ORAL | Status: DC
  Administered 2012-07-26 – 2012-07-29 (×4): 81 mg via ORAL
  Filled 2012-07-25 (×5): qty 1

## 2012-07-25 MED ORDER — LEVALBUTEROL HCL 1.25 MG/0.5ML IN NEBU
1.2500 mg | INHALATION_SOLUTION | Freq: Four times a day (QID) | RESPIRATORY_TRACT | Status: DC | PRN
  Administered 2012-07-25: 1.25 mg via RESPIRATORY_TRACT
  Filled 2012-07-25: qty 0.5

## 2012-07-25 MED ORDER — LEVALBUTEROL HCL 0.63 MG/3ML IN NEBU: 0.6300 mg | INHALATION_SOLUTION | RESPIRATORY_TRACT | Status: DC

## 2012-07-25 MED ORDER — ASPIRIN 81 MG PO CHEW: 324.0000 mg | CHEWABLE_TABLET | ORAL | Status: AC

## 2012-07-25 MED ORDER — TIOTROPIUM BROMIDE MONOHYDRATE 18 MCG IN CAPS
18.0000 ug | ORAL_CAPSULE | Freq: Every day | RESPIRATORY_TRACT | Status: DC
  Administered 2012-07-25 – 2012-08-03 (×10): 18 ug via RESPIRATORY_TRACT
  Filled 2012-07-25 (×3): qty 5

## 2012-07-25 MED ORDER — NITROGLYCERIN 0.4 MG SL SUBL: 0.4000 mg | SUBLINGUAL_TABLET | SUBLINGUAL | Status: DC | PRN

## 2012-07-25 MED ORDER — IPRATROPIUM BROMIDE 0.02 % IN SOLN
0.5000 mg | Freq: Four times a day (QID) | RESPIRATORY_TRACT | Status: DC | PRN
  Administered 2012-07-25 – 2012-07-26 (×3): 0.5 mg via RESPIRATORY_TRACT
  Filled 2012-07-25 (×3): qty 2.5

## 2012-07-25 MED ORDER — SODIUM CHLORIDE 0.9 % IJ SOLN
10.0000 mL | Freq: Two times a day (BID) | INTRAMUSCULAR | Status: DC
  Administered 2012-07-25 – 2012-07-26 (×2): 10 mL

## 2012-07-25 MED ORDER — ASPIRIN 81 MG PO CHEW: 324.0000 mg | CHEWABLE_TABLET | ORAL | Status: DC

## 2012-07-25 MED ORDER — PIPERACILLIN-TAZOBACTAM 3.375 G IVPB
3.3750 g | Freq: Once | INTRAVENOUS | Status: AC
  Administered 2012-07-25: 3.375 g via INTRAVENOUS
  Filled 2012-07-25: qty 50

## 2012-07-25 MED ORDER — AMIODARONE HCL IN DEXTROSE 360-4.14 MG/200ML-% IV SOLN
60.0000 mg/h | INTRAVENOUS | Status: AC
  Administered 2012-07-25: 60 mg/h via INTRAVENOUS
  Filled 2012-07-25: qty 200

## 2012-07-25 MED ORDER — ACETAMINOPHEN 325 MG PO TABS: 650.0000 mg | ORAL_TABLET | ORAL | Status: DC | PRN

## 2012-07-25 MED ORDER — FUROSEMIDE 10 MG/ML IJ SOLN
80.0000 mg | Freq: Once | INTRAMUSCULAR | Status: AC
  Administered 2012-07-25: 80 mg via INTRAVENOUS
  Filled 2012-07-25: qty 8
  Filled 2012-07-25: qty 4

## 2012-07-25 MED ORDER — SODIUM CHLORIDE 0.9 % IJ SOLN: 3.0000 mL | Freq: Two times a day (BID) | INTRAMUSCULAR | Status: DC

## 2012-07-25 MED ORDER — LEVALBUTEROL HCL 1.25 MG/3ML IN NEBU
1.2500 mg | INHALATION_SOLUTION | Freq: Once | RESPIRATORY_TRACT | Status: AC
  Administered 2012-07-25: 1.25 mg via RESPIRATORY_TRACT
  Filled 2012-07-25: qty 3

## 2012-07-25 MED ORDER — WARFARIN SODIUM 2 MG PO TABS: 2.0000 mg | ORAL_TABLET | Freq: Every day | ORAL | Status: DC

## 2012-07-25 MED ORDER — DILTIAZEM HCL 25 MG/5ML IV SOLN
10.0000 mg | Freq: Once | INTRAVENOUS | Status: AC
  Administered 2012-07-25: 10 mg via INTRAVENOUS
  Filled 2012-07-25: qty 5

## 2012-07-25 MED ORDER — ASPIRIN 300 MG RE SUPP: 300.0000 mg | RECTAL | Status: DC

## 2012-07-25 MED ORDER — PREDNISONE 20 MG PO TABS: 60.0000 mg | ORAL_TABLET | Freq: Once | ORAL | Status: DC

## 2012-07-25 MED ORDER — ONDANSETRON HCL 4 MG/2ML IJ SOLN: 4.0000 mg | Freq: Once | INTRAMUSCULAR | Status: DC | PRN

## 2012-07-25 MED ORDER — OXYCODONE HCL 5 MG PO TABS: 5.0000 mg | ORAL_TABLET | Freq: Once | ORAL | Status: AC | PRN

## 2012-07-25 MED ORDER — DILTIAZEM LOAD VIA INFUSION: 10.0000 mg | INTRAVENOUS | Status: DC

## 2012-07-25 MED ORDER — SODIUM CHLORIDE 0.9 % IJ SOLN
10.0000 mL | INTRAMUSCULAR | Status: DC | PRN
  Administered 2012-07-26 – 2012-08-03 (×11): 10 mL

## 2012-07-25 MED ORDER — FUROSEMIDE 10 MG/ML IJ SOLN
80.0000 mg | Freq: Once | INTRAMUSCULAR | Status: AC
  Administered 2012-07-25: 80 mg via INTRAVENOUS
  Filled 2012-07-25: qty 8

## 2012-07-25 MED ORDER — OXYCODONE HCL 5 MG/5ML PO SOLN: 5.0000 mg | Freq: Once | ORAL | Status: AC | PRN

## 2012-07-25 MED ORDER — METHYLPREDNISOLONE SODIUM SUCC 125 MG IJ SOLR
125.0000 mg | Freq: Once | INTRAMUSCULAR | Status: AC
  Administered 2012-07-25: 125 mg via INTRAMUSCULAR
  Filled 2012-07-25: qty 2

## 2012-07-25 MED ORDER — SENNOSIDES-DOCUSATE SODIUM 8.6-50 MG PO TABS
2.0000 | ORAL_TABLET | Freq: Every day | ORAL | Status: DC
  Administered 2012-07-25 – 2012-08-02 (×9): 2 via ORAL
  Filled 2012-07-25 (×11): qty 2

## 2012-07-25 MED ORDER — LEVALBUTEROL HCL 1.25 MG/0.5ML IN NEBU
1.2500 mg | INHALATION_SOLUTION | Freq: Once | RESPIRATORY_TRACT | Status: AC
  Administered 2012-07-25: 1.25 mg via RESPIRATORY_TRACT
  Filled 2012-07-25: qty 0.5

## 2012-07-25 MED ORDER — SODIUM CHLORIDE 0.9 % IJ SOLN: 3.0000 mL | INTRAMUSCULAR | Status: DC | PRN

## 2012-07-25 MED ORDER — FUROSEMIDE 10 MG/ML IJ SOLN
40.0000 mg | INTRAMUSCULAR | Status: AC
  Administered 2012-07-25: 40 mg via INTRAVENOUS

## 2012-07-25 MED ORDER — VANCOMYCIN HCL 10 G IV SOLR
1.0000 g | Freq: Once | INTRAVENOUS | Status: DC
Start: 2012-07-25 — End: 2012-07-25

## 2012-07-25 MED ORDER — IPRATROPIUM BROMIDE 0.02 % IN SOLN
0.5000 mg | Freq: Once | RESPIRATORY_TRACT | Status: AC
  Administered 2012-07-25: 0.5 mg via RESPIRATORY_TRACT
  Filled 2012-07-25: qty 2.5

## 2012-07-25 MED ORDER — ASPIRIN 300 MG RE SUPP
300.0000 mg | RECTAL | Status: AC
  Filled 2012-07-25: qty 1

## 2012-07-25 MED ORDER — PROPAFENONE HCL 150 MG PO TABS: 150.0000 mg | ORAL_TABLET | Freq: Three times a day (TID) | ORAL | Status: DC

## 2012-07-25 MED ORDER — AMIODARONE HCL IN DEXTROSE 360-4.14 MG/200ML-% IV SOLN
30.0000 mg/h | INTRAVENOUS | Status: DC
  Administered 2012-07-25 – 2012-07-26 (×4): 30 mg/h via INTRAVENOUS
  Filled 2012-07-25 (×8): qty 200

## 2012-07-25 MED ORDER — METOPROLOL TARTRATE 1 MG/ML IV SOLN
INTRAVENOUS | Status: AC
  Administered 2012-07-25: 5 mg
  Filled 2012-07-25: qty 5

## 2012-07-25 MED ORDER — VANCOMYCIN HCL IN DEXTROSE 1-5 GM/200ML-% IV SOLN
1000.0000 mg | Freq: Once | INTRAVENOUS | Status: AC
  Administered 2012-07-25: 1000 mg via INTRAVENOUS
  Filled 2012-07-25: qty 200

## 2012-07-25 MED ORDER — ATORVASTATIN CALCIUM 10 MG PO TABS
10.0000 mg | ORAL_TABLET | ORAL | Status: DC
  Administered 2012-07-26 – 2012-08-03 (×5): 10 mg via ORAL
  Filled 2012-07-25 (×5): qty 1

## 2012-07-25 MED ORDER — SODIUM CHLORIDE 0.9 % IV SOLN
INTRAVENOUS | Status: DC
  Administered 2012-07-25: 12:00:00 via INTRAVENOUS

## 2012-07-25 MED ORDER — HYDROMORPHONE HCL PF 1 MG/ML IJ SOLN: 0.2500 mg | INTRAMUSCULAR | Status: DC | PRN

## 2012-07-25 MED ORDER — PIPERACILLIN-TAZOBACTAM 3.375 G IVPB
3.3750 g | Freq: Three times a day (TID) | INTRAVENOUS | Status: DC
  Administered 2012-07-25 – 2012-07-27 (×5): 3.375 g via INTRAVENOUS
  Filled 2012-07-25 (×10): qty 50

## 2012-07-25 MED ORDER — SODIUM CHLORIDE 0.9 % IV SOLN
250.0000 mL | INTRAVENOUS | Status: DC | PRN
  Administered 2012-07-25: 250 mL via INTRAVENOUS

## 2012-07-25 MED ORDER — AMIODARONE LOAD VIA INFUSION
150.0000 mg | Freq: Once | INTRAVENOUS | Status: AC
  Administered 2012-07-25: 150 mg via INTRAVENOUS
  Filled 2012-07-25: qty 83.34

## 2012-07-25 MED ORDER — IPRATROPIUM BROMIDE 0.02 % IN SOLN: 0.5000 mg | Freq: Four times a day (QID) | RESPIRATORY_TRACT | Status: DC | PRN

## 2012-07-25 MED ORDER — LEVOTHYROXINE SODIUM 25 MCG PO TABS
25.0000 ug | ORAL_TABLET | Freq: Every day | ORAL | Status: DC
  Administered 2012-07-26 – 2012-08-03 (×9): 25 ug via ORAL
  Filled 2012-07-25 (×14): qty 1

## 2012-07-25 MED ORDER — MOMETASONE FURO-FORMOTEROL FUM 100-5 MCG/ACT IN AERO
2.0000 | INHALATION_SPRAY | Freq: Two times a day (BID) | RESPIRATORY_TRACT | Status: DC
  Administered 2012-07-25 – 2012-08-03 (×17): 2 via RESPIRATORY_TRACT
  Filled 2012-07-25: qty 8.8

## 2012-07-25 MED ORDER — TEMAZEPAM 15 MG PO CAPS
15.0000 mg | ORAL_CAPSULE | Freq: Every day | ORAL | Status: DC
  Administered 2012-07-25 – 2012-08-02 (×9): 15 mg via ORAL
  Filled 2012-07-25 (×8): qty 1

## 2012-07-25 MED ORDER — SODIUM CHLORIDE 0.9 % IJ SOLN
3.0000 mL | Freq: Two times a day (BID) | INTRAMUSCULAR | Status: DC
  Administered 2012-07-25 – 2012-08-02 (×3): 3 mL via INTRAVENOUS

## 2012-07-25 NOTE — ED Notes (Signed)
Pt given water 

## 2012-07-25 NOTE — ED Notes (Addendum)
EMS-pt to ed from home.  C/o shortness of breath since 3am.  Has hx of COPD. Had cardioversion done yesterday for aflutter.  Pt not on O2 at home.  Pt sat was 91 prior to o2 being applied.

## 2012-07-25 NOTE — Progress Notes (Signed)
Spoke with Pt. Regarding PICC line.  Discuss risks and benefits.  Pt unsure if wants PICC line and wishes to think about it.  Will follow up later.

## 2012-07-25 NOTE — H&P (Signed)
Julie Rich is an 79 y.o. female.   Chief Complaint: sob HPI: Julie Rich is a very pleasant 79 year old woman with a history of hypertension, paroxysmal atrial fibrillation and flutter, and COPD. The patient was cardioverted yesterday after transesophageal echo demonstrated no left atrial appendage thrombus. Her procedure was notable for modest hypotension and prolonged respiratory depression with her anesthetic agent. After awakening, she felt well and was discharged home. She will from sleep several hours ago, feeling short of breath. She denies fevers, chills, productive sputum, or chest pain. No cough or hemoptysis. She denies dietary indiscretion.  Past Medical History  Diagnosis Date  . HTN (hypertension)   . A-fib   . Dyslipidemia   . Hypothyroidism   . Stroke   . Asthma   . Shortness of breath   . DEMENTIA   . CHF (congestive heart failure)   . GERD (gastroesophageal reflux disease)   . Anxiety   . COPD (chronic obstructive pulmonary disease)   . STROKE 04/14/2007    Past Surgical History  Procedure Laterality Date  . Total abdominal hysterectomy    . Breast surgery      45-50 YEARS AGO LUMP REMOVED  . Appendectomy    . Cardioversion  06/26/2011    Procedure: CARDIOVERSION;  Surgeon: Marca Ancona, MD;  Location: [facility] OR;  Service: Cardiovascular;  Laterality: N/A;    Family History  Problem Relation Age of Onset  . Coronary artery disease Neg Hx   . Stroke Brother   . Breast cancer Mother    Social History:  reports that she has never smoked. She has never used smokeless tobacco. She reports that she does not drink alcohol or use illicit drugs.  Allergies:  Allergies  Allergen Reactions  . Ciprofloxacin     unknown  . Quinolones     unknown  . Sulfonamide Derivatives     unknown  . Zolpidem Tartrate     Hallucinations     (Not in a hospital admission)  Results for orders placed during the hospital encounter of 07/25/12 (from the past 48 hour(s))  CBC  WITH DIFFERENTIAL     Status: Abnormal   Collection Time    07/25/12  5:02 AM      Result Value Range   WBC 5.9  4.0 - 10.5 K/uL   RBC 3.75 (*) 3.87 - 5.11 MIL/uL   Hemoglobin 11.3 (*) 12.0 - 15.0 g/dL   HCT 16.1 (*) 09.6 - 04.5 %   MCV 92.8  78.0 - 100.0 fL   MCH 30.1  26.0 - 34.0 pg   MCHC 32.5  30.0 - 36.0 g/dL   RDW 40.9  81.1 - 91.4 %   Platelets 300  150 - 400 K/uL   Neutrophils Relative 73  43 - 77 %   Neutro Abs 4.3  1.7 - 7.7 K/uL   Lymphocytes Relative 14  12 - 46 %   Lymphs Abs 0.8  0.7 - 4.0 K/uL   Monocytes Relative 10  3 - 12 %   Monocytes Absolute 0.6  0.1 - 1.0 K/uL   Eosinophils Relative 2  0 - 5 %   Eosinophils Absolute 0.1  0.0 - 0.7 K/uL   Basophils Relative 1  0 - 1 %   Basophils Absolute 0.0  0.0 - 0.1 K/uL  PRO B NATRIURETIC PEPTIDE     Status: Abnormal   Collection Time    07/25/12  5:02 AM      Result Value Range  Pro B Natriuretic peptide (BNP) 3124.0 (*) 0 - 450 pg/mL  COMPREHENSIVE METABOLIC PANEL     Status: Abnormal   Collection Time    07/25/12  5:02 AM      Result Value Range   Sodium 140  135 - 145 mEq/L   Potassium 3.9  3.5 - 5.1 mEq/L   Chloride 104  96 - 112 mEq/L   CO2 27  19 - 32 mEq/L   Glucose, Bld 123 (*) 70 - 99 mg/dL   BUN 22  6 - 23 mg/dL   Creatinine, Ser 1.61 (*) 0.50 - 1.10 mg/dL   Calcium 9.8  8.4 - 09.6 mg/dL   Total Protein 6.2  6.0 - 8.3 g/dL   Albumin 3.1 (*) 3.5 - 5.2 g/dL   AST 40 (*) 0 - 37 U/L   ALT 56 (*) 0 - 35 U/L   Alkaline Phosphatase 85  39 - 117 U/L   Total Bilirubin 0.3  0.3 - 1.2 mg/dL   GFR calc non Af Amer 32 (*) >90 mL/min   GFR calc Af Amer 37 (*) >90 mL/min   Comment:            The eGFR has been calculated     using the CKD EPI equation.     This calculation has not been     validated in all clinical     situations.     eGFR's persistently     <90 mL/min signify     possible Chronic Kidney Disease.  POCT I-STAT TROPONIN I     Status: None   Collection Time    07/25/12  5:07 AM       Result Value Range   Troponin i, poc 0.03  0.00 - 0.08 ng/mL   Comment 3            Comment: Due to the release kinetics of cTnI,     a negative result within the first hours     of the onset of symptoms does not rule out     myocardial infarction with certainty.     If myocardial infarction is still suspected,     repeat the test at appropriate intervals.   Dg Chest Portable 1 View  07/25/2012  *RADIOLOGY REPORT*  Clinical Data: Shortness of breath  PORTABLE CHEST - 1 VIEW  Comparison: 07/01/2012  Findings: Small bilateral pleural effusions with associated airspace opacities.  Heart size upper normal. Central vascular congestion and mild perihilar/right greater than left infrahilar opacity.  Aortic tortuosity.  Interstitial coarsening and emphysema.  Diffuse osteopenia.  IMPRESSION: Small bilateral pleural effusions with associated airspace opacity; atelectasis versus infiltrate.  Cardiomegaly with central vascular congestion and mild edema pattern.  Emphysema.   Original Report Authenticated By: Jearld Lesch, M.D.     ROS - all systems reviewed and negative except as noted in the history of present illness  Physical exam  Blood pressure 166/64, pulse 71, temperature 97.6 F (36.4 C), temperature source Axillary, resp. rate 28, SpO2 96.00%. Elderly appearing NAD HEENT: Unremarkable Neck:  No JVD, no thyromegally Lungs:  Scattered rales throughout, with egophony in the right base HEART:  Regular rate rhythm, no murmurs, no rubs, no clicks Abd:  Flat, positive bowel sounds, no organomegally, no rebound, no guarding Ext:  2 plus pulses, no edema, no cyanosis, no clubbing Skin:  No rashes no nodules Neuro:  CN II through XII intact, motor grossly intact  ECG - normal sinus rhythm  Chest x-ray - increased pulmonary vascularity with possible right lower lobe infiltrate versus atelectasis  Assessment/Plan 1. sudden onset shortness of breath after TEE guided cardioversion 2. known  diastolic heart failure, chronic 3. persistent atrial fibrillation 4. COPD Rec: The etiology of the patient's shortness of breath was uncertain. She could have acute diastolic heart failure, aspiration pneumonia, or some combination of both. She does not have a fever or elevated white count currently. We'll plan to make the patient to the hospital, continue IV antibiotics, treat her with IV Lasix, and follow her exam and x-ray.  Gregg Taylor,M.D. 07/25/2012, 9:43 AM

## 2012-07-25 NOTE — Telephone Encounter (Signed)
Called by daughter; patient with shortness of breath and tachypneic; did not respond to inhaler; I recommended she call 911 or go to the nearest ER; she understood

## 2012-07-25 NOTE — Significant Event (Addendum)
[facility] cardio   called informed of hr 135 -182m  resp at 28 -33 labored using shoulders to breath before/p 170 -165 systolic . ekg showed Aflutter hr 135. rapibid response called.  Foley cath placed per order clear yellow urine ,40mg  lasix iv given per order.  Pt transferred to 2905 report given

## 2012-07-25 NOTE — Progress Notes (Signed)
ANTICOAGULATION CONSULT NOTE - Initial Consult  Pharmacy Consult for Coumadin  Indication: atrial fibrillation/flutter  Allergies  Allergen Reactions  . Ciprofloxacin     unknown  . Quinolones     unknown  . Sulfonamide Derivatives     unknown  . Zolpidem Tartrate     Hallucinations    Patient Measurements: Height: 5' (152.4 cm) Weight: 107 lb 5.8 oz (48.7 kg) IBW/kg (Calculated) : 45.5  Vital Signs: Temp: 97.8 F (36.6 C) (03/01 1245) Temp src: Oral (03/01 1245) BP: 147/66 mmHg (03/01 1600) Pulse Rate: 106 (03/01 1600)  Labs:  Recent Labs  07/25/12 0502 07/25/12 1222 07/25/12 1702  HGB 11.3*  --   --   HCT 34.8*  --   --   PLT 300  --   --   LABPROT  --   --  35.5*  INR  --   --  3.84*  CREATININE 1.41*  --   --   TROPONINI  --  <0.30  --     Estimated Creatinine Clearance: 19.8 ml/min (by C-G formula based on Cr of 1.41).   Medical History: Past Medical History  Diagnosis Date  . HTN (hypertension)   . A-fib   . Dyslipidemia   . Hypothyroidism   . Stroke   . Asthma   . Shortness of breath   . DEMENTIA   . CHF (congestive heart failure)   . GERD (gastroesophageal reflux disease)   . Anxiety   . COPD (chronic obstructive pulmonary disease)   . STROKE 04/14/2007    Medications:  Coumadin PTA dose ranging 2-4mg    Assessment: Julie Rich is an 34 yof admitted recently TEE cardioverted 2/28 and developed sudden SOB. Pharmacy to manage coumadin in hospital. Last dose taken was 3mg  on 2/28. INR today elevated at 3.84. CBC ok and plts WNL.   Noted that patient was starting on amiodarone drip today which will increase INR.   Goal of Therapy:  INR 2-3 Monitor platelets by anticoagulation protocol: Yes   Plan:  No coumadin today  F/u Daily PT/INR  Thank you,  Brett Fairy, PharmD, BCPS 07/25/2012 7:21 PM

## 2012-07-25 NOTE — Progress Notes (Signed)
Called to see patient while in 4729 for rapid atrial fib/flutter with hypertension, and dyspnea. Reviewed EKG and found significant T-wave depression in the lateral leads with HR of 140 bpm. Patient was placed on NRB, given IV lasix 40 mg X1, Xopenex treatment. Cardizem bolus 10mg  ordered along with amiodarone gtt. Unable to start while on the floor, therefore lopressor 5 mg IV was given with HR coming down into the 115 range. Foley catheter placed, and patient moved to ICU. EKG reviewed by Dr. Ladona Ridgel. Follow up cardiac enzymes are ordered with CXR, BMET in am.

## 2012-07-25 NOTE — ED Notes (Signed)
Patient is very weak she is hoarse when she speaks.

## 2012-07-25 NOTE — ED Provider Notes (Signed)
History     CSN: [id number]  Arrival date & time 07/25/12  0416   First MD Initiated Contact with Patient 07/25/12 [phone]      Chief Complaint  Patient presents with  . Shortness of Breath    (Consider location/radiation/quality/duration/timing/severity/associated sxs/prior treatment) HPI  Patient reports she had a TEE done with cardioversion yesterday, February 28 by Dr. Tenny Craw of [facility] cardiology for atrial fibrillation/flutter. She reports she acutely got short of breath about 3 AM this morning. Patient has a history of COPD and reports she used her inhaler without improvement. Patient denies cough, chest pain, fever or wheezing. EMS report her pulse ox was 91% on room air at home.  PCP Dr Waynard Edwards Pulmonologist Dr Sherene Sires Cardiologist Dr Antoine Poche  Past Medical History  Diagnosis Date  . HTN (hypertension)   . A-fib   . Dyslipidemia   . Hypothyroidism   . Stroke   . Asthma   . Shortness of breath   . DEMENTIA   . CHF (congestive heart failure)   . GERD (gastroesophageal reflux disease)   . Anxiety   . COPD (chronic obstructive pulmonary disease)   . STROKE 04/14/2007    Past Surgical History  Procedure Laterality Date  . Total abdominal hysterectomy    . Breast surgery      45-50 YEARS AGO LUMP REMOVED  . Appendectomy    . Cardioversion  06/26/2011    Procedure: CARDIOVERSION;  Surgeon: Marca Ancona, MD;  Location: [facility] OR;  Service: Cardiovascular;  Laterality: N/A;    Family History  Problem Relation Age of Onset  . Coronary artery disease Neg Hx   . Stroke Brother   . Breast cancer Mother     History  Substance Use Topics  . Smoking status: Never Smoker   . Smokeless tobacco: Never Used     Comment: does not smoke   . Alcohol Use: No  + second hand smoke Lives at home Lives alone  OB History   Grav Para Term Preterm Abortions TAB SAB Ect Mult Living                  Review of Systems  All other systems reviewed and are negative.    Allergies   Ciprofloxacin; Quinolones; Sulfonamide derivatives; and Zolpidem tartrate  Home Medications   Current Outpatient Rx  Name  Route  Sig  Dispense  Refill  . albuterol (PROVENTIL HFA;VENTOLIN HFA) 108 (90 BASE) MCG/ACT inhaler   Inhalation   Inhale 2 puffs into the lungs every 6 (six) hours as needed. For shortness of breath/wheezing         . ALPRAZolam (XANAX) 0.25 MG tablet   Oral   Take 1 tablet (0.25 mg total) by mouth daily as needed. For anxiety   30 tablet   0   . atorvastatin (LIPITOR) 20 MG tablet   Oral   Take 10 mg by mouth every other day.         . chlorpheniramine-HYDROcodone (TUSSIONEX) 10-8 MG/5ML LQCR   Oral   Take 5 mLs by mouth every 12 (twelve) hours as needed.   140 mL   0   . diltiazem (CARDIZEM) 30 MG tablet   Oral   Take 1 tablet (30 mg total) by mouth as needed (30 mg as needed for increased heart rate).   30 tablet   6   . Fluticasone-Salmeterol (ADVAIR DISKUS) 250-50 MCG/DOSE AEPB   Inhalation   Inhale 1 puff into the lungs 2 (two) times  daily.          . guaiFENesin-dextromethorphan ([name] DM) 100-10 MG/5ML syrup   Oral   Take 5 mLs by mouth every 4 (four) hours as needed for cough.   118 mL   0   . levothyroxine (SYNTHROID, LEVOTHROID) 25 MCG tablet   Oral   Take 25 mcg by mouth daily.          . metoprolol succinate (TOPROL-XL) 100 MG 24 hr tablet      100 MG IN THE AM; 50 MG IN THE PM         . propafenone (RYTHMOL) 150 MG tablet   Oral   Take 150 mg by mouth every 8 (eight) hours.         . senna-docusate (SENOKOT-S) 8.6-50 MG per tablet   Oral   Take 2 tablets by mouth at bedtime.   30 tablet   0   . temazepam (RESTORIL) 15 MG capsule   Oral   Take 15 mg by mouth at bedtime.          Marland Kitchen tiotropium (SPIRIVA) 18 MCG inhalation capsule   Inhalation   Place 18 mcg into inhaler and inhale daily.          Marland Kitchen warfarin (COUMADIN) 2 MG tablet   Oral   Take 2-4 mg by mouth daily. As directed            BP 190/81  Pulse 66  Temp(Src) 97.6 F (36.4 C) (Axillary)  Resp 23  SpO2 91%  Vital signs normal except for hypertension   Physical Exam  Nursing note and vitals reviewed. Constitutional: She is oriented to person, place, and time. She appears well-developed and well-nourished.  Non-toxic appearance. She does not appear ill. No distress.  HENT:  Head: Normocephalic and atraumatic.  Right Ear: External ear normal.  Left Ear: External ear normal.  Nose: Nose normal. No mucosal edema or rhinorrhea.  Mouth/Throat: Oropharynx is clear and moist and mucous membranes are normal. No dental abscesses or edematous.  Patient is hoarse which she states started after she had her procedure today  Eyes: Conjunctivae and EOM are normal. Pupils are equal, round, and reactive to light.  Neck: Normal range of motion and full passive range of motion without pain. Neck supple.  Cardiovascular: Normal rate, regular rhythm and normal heart sounds.  Exam reveals no gallop and no friction rub.   No murmur heard. Pulmonary/Chest: She is in respiratory distress. She has decreased breath sounds. She has no wheezes. She has no rhonchi. She has no rales. She exhibits no tenderness and no crepitus.  Abdominal: Soft. Normal appearance and bowel sounds are normal. She exhibits no distension. There is no tenderness. There is no rebound and no guarding.  Musculoskeletal: Normal range of motion. She exhibits no edema and no tenderness.  Moves all extremities well.   Neurological: She is alert and oriented to person, place, and time. She has normal strength. No cranial nerve deficit.  Skin: Skin is warm, dry and intact. No rash noted. No erythema. No pallor.  Psychiatric: She has a normal mood and affect. Her speech is normal and behavior is normal. Her mood appears not anxious.    ED Course  Procedures (including critical care time)  Medications  furosemide (LASIX) injection 80 mg (not administered)   piperacillin-tazobactam (ZOSYN) IVPB 3.375 g (not administered)  vancomycin (VANCOCIN) IVPB 1000 mg/200 mL premix (not administered)  levalbuterol (XOPENEX) nebulizer solution 1.25 mg (1.25 mg Nebulization Given  07/25/12 0525)  methylPREDNISolone sodium succinate (SOLU-MEDROL) 125 mg/2 mL injection 125 mg (125 mg Intramuscular Given 07/25/12 0506)  levalbuterol (XOPENEX) nebulizer solution 1.25 mg (1.25 mg Nebulization Given 07/25/12 0630)  ipratropium (ATROVENT) nebulizer solution 0.5 mg (0.5 mg Nebulization Given 07/25/12 0630)  levalbuterol (XOPENEX) nebulizer solution 1.25 mg (1.25 mg Nebulization Given 07/25/12 0812)  ipratropium (ATROVENT) nebulizer solution 0.5 mg (0.5 mg Nebulization Given 07/25/12 0757)   06:00 Recheck, HR 66 in NSR, still appears to have some tachypnea, lungs are diminished diffusely without wheezing, has mild retractions, but is improved from when she arrived, will repeat the xoprenex nebulizer.   Recheck 07:30 Still has SOB with some retractions. HR 67 and still in NSR, will order 3rd treatment.    Results for orders placed during the hospital encounter of 07/25/12  CBC WITH DIFFERENTIAL      Result Value Range   WBC 5.9  4.0 - 10.5 K/uL   RBC 3.75 (*) 3.87 - 5.11 MIL/uL   Hemoglobin 11.3 (*) 12.0 - 15.0 g/dL   HCT 16.1 (*) 09.6 - 04.5 %   MCV 92.8  78.0 - 100.0 fL   MCH 30.1  26.0 - 34.0 pg   MCHC 32.5  30.0 - 36.0 g/dL   RDW 40.9  81.1 - 91.4 %   Platelets 300  150 - 400 K/uL   Neutrophils Relative 73  43 - 77 %   Neutro Abs 4.3  1.7 - 7.7 K/uL   Lymphocytes Relative 14  12 - 46 %   Lymphs Abs 0.8  0.7 - 4.0 K/uL   Monocytes Relative 10  3 - 12 %   Monocytes Absolute 0.6  0.1 - 1.0 K/uL   Eosinophils Relative 2  0 - 5 %   Eosinophils Absolute 0.1  0.0 - 0.7 K/uL   Basophils Relative 1  0 - 1 %   Basophils Absolute 0.0  0.0 - 0.1 K/uL  PRO B NATRIURETIC PEPTIDE      Result Value Range   Pro B Natriuretic peptide (BNP) 3124.0 (*) 0 - 450 pg/mL  COMPREHENSIVE  METABOLIC PANEL      Result Value Range   Sodium 140  135 - 145 mEq/L   Potassium 3.9  3.5 - 5.1 mEq/L   Chloride 104  96 - 112 mEq/L   CO2 27  19 - 32 mEq/L   Glucose, Bld 123 (*) 70 - 99 mg/dL   BUN 22  6 - 23 mg/dL   Creatinine, Ser 7.82 (*) 0.50 - 1.10 mg/dL   Calcium 9.8  8.4 - 95.6 mg/dL   Total Protein 6.2  6.0 - 8.3 g/dL   Albumin 3.1 (*) 3.5 - 5.2 g/dL   AST 40 (*) 0 - 37 U/L   ALT 56 (*) 0 - 35 U/L   Alkaline Phosphatase 85  39 - 117 U/L   Total Bilirubin 0.3  0.3 - 1.2 mg/dL   GFR calc non Af Amer 32 (*) >90 mL/min   GFR calc Af Amer 37 (*) >90 mL/min  POCT I-STAT TROPONIN I      Result Value Range   Troponin i, poc 0.03  0.00 - 0.08 ng/mL   Comment 3            Laboratory interpretation all normal except elevated BNP, renal insuffic, mild anemia  Dg Chest Portable 1 View  07/25/2012  *RADIOLOGY REPORT*  Clinical Data: Shortness of breath  PORTABLE CHEST - 1 VIEW  Comparison: 07/01/2012  Findings: Small bilateral pleural effusions with associated airspace opacities.  Heart size upper normal. Central vascular congestion and mild perihilar/right greater than left infrahilar opacity.  Aortic tortuosity.  Interstitial coarsening and emphysema.  Diffuse osteopenia.  IMPRESSION: Small bilateral pleural effusions with associated airspace opacity; atelectasis versus infiltrate.  Cardiomegaly with central vascular congestion and mild edema pattern.  Emphysema.   Original Report Authenticated By: Jearld Lesch, M.D.     Dg Chest 2 View  07/01/2012  t.  IMPRESSION: Cardiomegaly with emphysema and probable small left pleural effusion.   Original Report Authenticated By: Kennith Center, M.D.    Dg Chest Portable 1 View  06/27/2012   IMPRESSION: Emphysematous changes and scattered fibrosis in the lungs. Possible small left pleural effusion.  No focal consolidation.   Original Report Authenticated By: Burman Nieves, M.D.         Date: 07/25/2012  Rate: 68  Rhythm: normal sinus  rhythm  QRS Axis: normal  Intervals: PR prolonged  ST/T Wave abnormalities: nonspecific ST/T changes  Conduction Disutrbances:none  Narrative Interpretation: Q waves anterior leads  Old EKG Reviewed: changes noted from 07/20/2012 was in a fib with rate 114     1. COPD with acute exacerbation   2. CHF (congestive heart failure)   3. Healthcare-associated pneumonia     Plan admission   Devoria Albe, MD, FACEP   CRITICAL CARE Performed by: Devoria Albe L   Total critical care time: 32 min   Critical care time was exclusive of separately billable procedures and treating other patients.  Critical care was necessary to treat or prevent imminent or life-threatening deterioration.  Critical care was time spent personally by me on the following activities: development of treatment plan with patient and/or surrogate as well as nursing, discussions with consultants, evaluation of patient's response to treatment, examination of patient, obtaining history from patient or surrogate, ordering and performing treatments and interventions, ordering and review of laboratory studies, ordering and review of radiographic studies, pulse oximetry and re-evaluation of patient's condition.   MDM          Ward Givens, MD 07/25/12 [phone]

## 2012-07-25 NOTE — Progress Notes (Signed)
Peripherally Inserted Central Catheter/Midline Placement  The IV Nurse has discussed with the patient and/or persons authorized to consent for the patient, the purpose of this procedure and the potential benefits and risks involved with this procedure.  The benefits include less needle sticks, lab draws from the catheter and patient may be discharged home with the catheter.  Risks include, but not limited to, infection, bleeding, blood clot (thrombus formation), and puncture of an artery; nerve damage and irregular heat beat.  Alternatives to this procedure were also discussed.  PICC/Midline Placement Documentation  PICC / Midline Double Lumen 07/25/12 PICC Right Basilic (Active)       Julie Rich 07/25/2012, 5:41 PM

## 2012-07-25 NOTE — ED Notes (Signed)
Attempted to call report to [facility] 4700. Unable to take report at this time.

## 2012-07-25 NOTE — ED Notes (Signed)
IV attempted times two per IV team without success. Dr. Alwyn Pea notified

## 2012-07-25 NOTE — ED Notes (Signed)
Attempted IV times two per two RN's without success. IV team notified.

## 2012-07-25 NOTE — ED Notes (Signed)
Pt states feeling some better after treatment

## 2012-07-26 DIAGNOSIS — J441 Chronic obstructive pulmonary disease with (acute) exacerbation: Secondary | ICD-10-CM

## 2012-07-26 DIAGNOSIS — I5031 Acute diastolic (congestive) heart failure: Secondary | ICD-10-CM

## 2012-07-26 LAB — BASIC METABOLIC PANEL
CO2: 35 mEq/L — ABNORMAL HIGH (ref 19–32)
CO2: 33 mEq/L — ABNORMAL HIGH (ref 19–32)
Calcium: 8.4 mg/dL (ref 8.4–10.5)
Calcium: 8.1 mg/dL — ABNORMAL LOW (ref 8.4–10.5)
Creatinine, Ser: 1.49 mg/dL — ABNORMAL HIGH (ref 0.50–1.10)
GFR calc Af Amer: 27 mL/min — ABNORMAL LOW (ref >90)
GFR calc non Af Amer: 24 mL/min — ABNORMAL LOW (ref >90)
Sodium: 136 mEq/L (ref 135–145)

## 2012-07-26 LAB — CBC
Hemoglobin: 11.3 g/dL — ABNORMAL LOW (ref 12.0–15.0)
MCHC: 33.9 g/dL (ref 30.0–36.0)
RBC: 3.66 MIL/uL — ABNORMAL LOW (ref 3.87–5.11)
WBC: 6.8 10*3/uL (ref 4.0–10.5)

## 2012-07-26 LAB — PROTIME-INR
INR: 4.27 — ABNORMAL HIGH (ref 0.00–1.49)
Prothrombin Time: 38.4 seconds — ABNORMAL HIGH (ref 11.6–15.2)

## 2012-07-26 MED ORDER — FUROSEMIDE 20 MG PO TABS
20.0000 mg | ORAL_TABLET | Freq: Every day | ORAL | Status: DC
  Administered 2012-07-26 – 2012-08-03 (×9): 20 mg via ORAL
  Filled 2012-07-26 (×9): qty 1

## 2012-07-26 MED ORDER — LEVALBUTEROL HCL 1.25 MG/0.5ML IN NEBU
0.6300 mg | INHALATION_SOLUTION | Freq: Four times a day (QID) | RESPIRATORY_TRACT | Status: DC | PRN
  Filled 2012-07-26: qty 0.26

## 2012-07-26 MED ORDER — SODIUM CHLORIDE 0.9 % IV SOLN
INTRAVENOUS | Status: DC
  Administered 2012-07-30 – 2012-07-31 (×2): via INTRAVENOUS

## 2012-07-26 MED ORDER — GUAIFENESIN 100 MG/5ML PO SOLN
5.0000 mL | ORAL | Status: DC | PRN
  Administered 2012-07-26 – 2012-08-01 (×5): 100 mg via ORAL
  Filled 2012-07-26 (×6): qty 5

## 2012-07-26 MED ORDER — POTASSIUM CHLORIDE CRYS ER 20 MEQ PO TBCR
40.0000 meq | EXTENDED_RELEASE_TABLET | Freq: Once | ORAL | Status: AC
  Administered 2012-07-26: 40 meq via ORAL

## 2012-07-26 MED ORDER — POTASSIUM CHLORIDE CRYS ER 20 MEQ PO TBCR
EXTENDED_RELEASE_TABLET | ORAL | Status: AC
  Administered 2012-07-26: 40 meq
  Filled 2012-07-26: qty 2

## 2012-07-26 MED ORDER — POTASSIUM CHLORIDE CRYS ER 20 MEQ PO TBCR
40.0000 meq | EXTENDED_RELEASE_TABLET | Freq: Once | ORAL | Status: DC
  Filled 2012-07-26 (×2): qty 2

## 2012-07-26 NOTE — Progress Notes (Signed)
NP Natalia Leatherwood paged regarding potassium; orders received; meds given; verbal DNR order received via telephone by Clyda Greener RN/S Piland RN

## 2012-07-26 NOTE — Consult Note (Signed)
PULMONARY  / CRITICAL CARE MEDICINE  Name: Julie Rich MRN: [id number] DOB: 1921-11-19    ADMISSION DATE:  07/25/2012 CONSULTATION DATE:  3/2  REFERRING MD :  Hochrein  CHIEF COMPLAINT:  Pulmonary consult  BRIEF PATIENT DESCRIPTION:  80 yowf never smoker with sob/ cough starting around 2000 & stable pulm nodules.  03/2007 FEV1 49%, FVC 82%  FEV1 improved to 59% on FU second hand smoke exposure - ? chronic bronchitis vs late onset asthma. Stable BL indeterminate nodules since 5/08.  Complex cyst in liver on MRI 1/09 likley benign. PCCM consulted 3-2 for suspected AECOPD/Pna. She has had recent AECOPD but currently reports breathing OK(note Afib with RVR), no productive sputum, no wheezes. On O2 but O2 sats 100%. She is followe by Dr. Sherene Sires in ithe office last seen 2/14 Maintained on advair/spiriva She is now on amiodarone for AF-RVR   SIGNIFICANT EVENTS / STUDIES:    LINES / TUBES: 3-1 rt picc>>  CULTURES: none  ANTIBIOTICS: 3-1 zoysn>>  HISTORY OF PRESENT ILLNESS:   48 yowf never smoker with sob/ cough starting around 2000 & stable pulm nodules.  03/2007 FEV1 49%, FVC 82%  second hand smoke exposure - ? chronic bronchitis vs late onset asthma. Stable BL indeterminate nodules since 5/08.  Complex cyst in liver on MRI 1/09 likley benign. PCCM consulted 3-2 for suspected AECOPD/Pna. She has had recent AECOPD but currently reports breathing OK(note Afib with RVR), no productive sputum, no wheezes. On O2 but O2 sats 100%. She is followe by Dr. Sherene Sires in ithe office last seen 2/14  PAST MEDICAL HISTORY :  Past Medical History  Diagnosis Date  . HTN (hypertension)   . A-fib   . Dyslipidemia   . Hypothyroidism   . Stroke   . Asthma   . Shortness of breath   . DEMENTIA   . CHF (congestive heart failure)   . GERD (gastroesophageal reflux disease)   . Anxiety   . COPD (chronic obstructive pulmonary disease)   . STROKE 04/14/2007   Past Surgical History  Procedure Laterality  Date  . Total abdominal hysterectomy    . Breast surgery      45-50 YEARS AGO LUMP REMOVED  . Appendectomy    . Cardioversion  06/26/2011    Procedure: CARDIOVERSION;  Surgeon: Marca Ancona, MD;  Location: [facility] OR;  Service: Cardiovascular;  Laterality: N/A;   Prior to Admission medications   Medication Sig Start Date End Date Taking? Authorizing Provider  atorvastatin (LIPITOR) 20 MG tablet Take 10 mg by mouth every other day.   Yes Historical Provider, MD  Fluticasone-Salmeterol (ADVAIR DISKUS) 250-50 MCG/DOSE AEPB Inhale 1 puff into the lungs 2 (two) times daily.    Yes Historical Provider, MD  levothyroxine (SYNTHROID, LEVOTHROID) 25 MCG tablet Take 25 mcg by mouth daily.    Yes Historical Provider, MD  propafenone (RYTHMOL) 150 MG tablet Take 150 mg by mouth every 8 (eight) hours.   Yes Historical Provider, MD  senna-docusate (SENOKOT-S) 8.6-50 MG per tablet Take 2 tablets by mouth at bedtime. 07/03/12  Yes Kathlen Mody, MD  temazepam (RESTORIL) 15 MG capsule Take 15 mg by mouth at bedtime.    Yes Historical Provider, MD  tiotropium (SPIRIVA) 18 MCG inhalation capsule Place 18 mcg into inhaler and inhale daily.    Yes Historical Provider, MD  warfarin (COUMADIN) 2 MG tablet Take 2-4 mg by mouth daily. As directed   Yes Historical Provider, MD   Allergies  Allergen Reactions  .  Ciprofloxacin     unknown  . Quinolones     unknown  . Sulfonamide Derivatives     unknown  . Zolpidem Tartrate     Hallucinations    FAMILY HISTORY:  Family History  Problem Relation Age of Onset  . Coronary artery disease Neg Hx   . Stroke Brother   . Breast cancer Mother    SOCIAL HISTORY:  reports that she has never smoked. She has never used smokeless tobacco. She reports that she does not drink alcohol or use illicit drugs.  REVIEW OF SYSTEMS:   10 point review of system taken, please see HPI for positives and negatives. .  SUBJECTIVE:  NAD @rest  VITAL SIGNS: Temp:  [97.4 F (36.3 C)-98.4  F (36.9 C)] 97.8 F (36.6 C) (03/02 0800) Pulse Rate:  [90-138] 112 (03/02 0700) Resp:  [20-31] 29 (03/02 0800) BP: (54-168)/(16-105) 112/63 mmHg (03/02 0800) SpO2:  [97 %-100 %] 100 % (03/02 0816) Weight:  [46.5 kg (102 lb 8.2 oz)-48.7 kg (107 lb 5.8 oz)] 46.5 kg (102 lb 8.2 oz) (03/02 0500)  PHYSICAL EXAMINATION: General:  Frail elderly WF in no Acute distress Neuro:  Intact HEENT: no lan Neck:  No jvd Cardiovascular:  hsir ir a fib hr 116-133 Lungs:  Decreased bs thruout Abdomen:  +bs Musculoskeletal:  intact Skin:  Lower ext edema noted   Recent Labs Lab 07/25/12 0502 07/26/12 0900  NA 140 138  K 3.9 3.1*  CL 104 94*  CO2 27 35*  BUN 22 21  CREATININE 1.41* 1.49*  GLUCOSE 123* 179*    Recent Labs Lab 07/25/12 0502 07/26/12 0013  HGB 11.3* 11.3*  HCT 34.8* 33.3*  WBC 5.9 6.8  PLT 300 236   Dg Chest Port 1 View  07/25/2012  *RADIOLOGY REPORT*  Clinical Data: 79 year old female line placement.  PORTABLE CHEST - 1 VIEW  Comparison: 0500 hours the same day and earlier.  Findings: Semi upright AP portable view at 1747 hours.  Right PICC line in place, tip at the lower SVC level. Stable lung volumes.  Stable cardiac size and mediastinal contours. No pneumothorax or pulmonary edema.  Small bilateral pleural effusions.  Overall stable ventilation.  IMPRESSION: 1.  Right PICC line placed, tip at the lower SVC level. 2.  Otherwise stable chest.   Original Report Authenticated By: Erskine Speed, M.D.    Dg Chest Portable 1 View  07/25/2012  *RADIOLOGY REPORT*  Clinical Data: Shortness of breath  PORTABLE CHEST - 1 VIEW  Comparison: 07/01/2012  Findings: Small bilateral pleural effusions with associated airspace opacities.  Heart size upper normal. Central vascular congestion and mild perihilar/right greater than left infrahilar opacity.  Aortic tortuosity.  Interstitial coarsening and emphysema.  Diffuse osteopenia.  IMPRESSION: Small bilateral pleural effusions with associated  airspace opacity; atelectasis versus infiltrate.  Cardiomegaly with central vascular congestion and mild edema pattern.  Emphysema.   Original Report Authenticated By: Jearld Lesch, M.D.     ASSESSMENT / PLAN:  79 yo female with secondhand tobacco exposure hx of AECOPD, no overt pna on c x r or by history. Plan: Agree with bd's. Dc xopenex due to AFIB with RVR, ct atrovent nebs - can switch back to advair/ spiriva on dc Dulera ok while in hospital No indication for abx at this time. Wean fio2 for sats > 92% Control RVR per cards - OK for amiodarone Pl arrange Fu with Dr Sherene Sires in [place] few wks after discharge  Brett Canales Minor ACNP Conley Rolls  [place] [facility] PCCM Pager [phone] till 3 pm If no answer page [phone]  Independently examined pt, evaluated data & formulated above care plan with NP, discussed with  [facility] V.  07/26/2012, 10:15 AM

## 2012-07-26 NOTE — Progress Notes (Signed)
Report to Ti, [id number]; pt transferred via RN, tele, O2

## 2012-07-26 NOTE — Progress Notes (Signed)
ANTICOAGULATION CONSULT NOTE - Follow Up Consult  Pharmacy Consult for coumadin Indication: atrial fibrillation  Allergies  Allergen Reactions  . Ciprofloxacin     unknown  . Quinolones     unknown  . Sulfonamide Derivatives     unknown  . Zolpidem Tartrate     Hallucinations    Patient Measurements: Height: 5' (152.4 cm) Weight: 102 lb 8.2 oz (46.5 kg) IBW/kg (Calculated) : 45.5   Vital Signs: Temp: 97.8 F (36.6 C) (03/02 0800) Temp src: Axillary (03/02 0400) BP: 112/63 mmHg (03/02 0800) Pulse Rate: 112 (03/02 0700)  Labs:  Recent Labs  07/25/12 0502 07/25/12 1222 07/25/12 1702 07/25/12 1831 07/26/12 0013 07/26/12 0900  HGB 11.3*  --   --   --  11.3*  --   HCT 34.8*  --   --   --  33.3*  --   PLT 300  --   --   --  236  --   LABPROT  --   --  35.5*  --  38.4*  --   INR  --   --  3.84*  --  4.27*  --   CREATININE 1.41*  --   --   --   --  1.49*  TROPONINI  --  <0.30  --  <0.30 <0.30  --     Estimated Creatinine Clearance: 18.7 ml/min (by C-G formula based on Cr of 1.49).   Medications:  Scheduled:  . [COMPLETED] amiodarone  150 mg Intravenous Once  . aspirin  324 mg Oral NOW   Or  . aspirin  300 mg Rectal NOW  . aspirin EC  81 mg Oral Daily  . atorvastatin  10 mg Oral QODAY  . [COMPLETED] diltiazem  10 mg Intravenous Once  . [COMPLETED] furosemide  40 mg Intravenous STAT  . [COMPLETED] furosemide  80 mg Intravenous Once  . furosemide  20 mg Oral Daily  . levothyroxine  25 mcg Oral QAC breakfast  . [COMPLETED] metoprolol      . mometasone-formoterol  2 puff Inhalation BID  . [COMPLETED] piperacillin-tazobactam (ZOSYN)  IV  3.375 g Intravenous Once  . piperacillin-tazobactam (ZOSYN)  IV  3.375 g Intravenous Q8H  . senna-docusate  2 tablet Oral QHS  . sodium chloride  10-40 mL Intracatheter Q12H  . sodium chloride  3 mL Intravenous Q12H  . temazepam  15 mg Oral QHS  . tiotropium  18 mcg Inhalation Daily  . [COMPLETED] sodium chloride    Intravenous STAT  . [DISCONTINUED] diltiazem  10 mg Intravenous STAT  . [DISCONTINUED] levalbuterol  0.63 mg Nebulization STAT  . [DISCONTINUED] propafenone  150 mg Oral Q8H  . [DISCONTINUED] sodium chloride  3 mL Intravenous Q12H  . [DISCONTINUED] warfarin  2-4 mg Oral Daily    Assessment: 89 yof admitted recently TEE cardioverted 2/28 and developed sudden SOB. Pharmacy to manage coumadin . Last dose taken was 3mg  on 2/28. INR today elevated at 4.27 today. CBC ok and plts WNL. Per Rn no noted bleeding. Noted DDI with amiodarone which will increase INR.  Goal of Therapy:  INR 2-3 Monitor platelets by anticoagulation protocol: Yes   Plan:  1. Coumadin on hold for today 2. Monitor for s/sx of bleeding  Bola A. Wandra Feinstein D Clinical Pharmacist Pager:[phone] Phone [phone] 07/26/2012 9:58 AM

## 2012-07-26 NOTE — Progress Notes (Signed)
Pt c/o cough, Julie Rich on call for Dr. Ladona Ridgel notified and instructed to use PRN standing order.  [name] ordered, and pharmacy called to send dose down.  Pt offered warm cup of tea as also requested.  Will continue to monitor.  [name], Charity fundraiser.

## 2012-07-26 NOTE — Progress Notes (Signed)
SUBJECTIVE:  Still coughing with upper airway wheezing.  No pain   PHYSICAL EXAM Filed Vitals:   07/26/12 0400 07/26/12 0500 07/26/12 0600 07/26/12 0700  BP: 122/60 126/94 106/78 132/72  Pulse: 125 108 115 112  Temp: 98 F (36.7 C)     TempSrc: Axillary     Resp: 22 23 26 22   Height:      Weight:  102 lb 8.2 oz (46.5 kg)    SpO2: 100% 100% 100% 100%   General:  Frail.  No acute distress Lungs:  Decreased breath sounds with upper airway wheezing Heart:  Irregular Abdomen:  Positive bowel sounds, no rebound no guarding Extremities:  No edema Neuro:  Nonfocal.  LABS: Lab Results  Component Value Date   CKTOTAL 41 07/04/2011   CKMB 2.5 07/04/2011   TROPONINI <0.30 07/26/2012   Results for orders placed during the hospital encounter of 07/25/12 (from the past 24 hour(s))  PRO B NATRIURETIC PEPTIDE     Status: Abnormal   Collection Time    07/25/12 12:22 PM      Result Value Range   Pro B Natriuretic peptide (BNP) 3522.0 (*) 0 - 450 pg/mL  TROPONIN I     Status: None   Collection Time    07/25/12 12:22 PM      Result Value Range   Troponin I <0.30  <0.30 ng/mL  MRSA PCR SCREENING     Status: None   Collection Time    07/25/12  4:18 PM      Result Value Range   MRSA by PCR NEGATIVE  NEGATIVE  PROTIME-INR     Status: Abnormal   Collection Time    07/25/12  5:02 PM      Result Value Range   Prothrombin Time 35.5 (*) 11.6 - 15.2 seconds   INR 3.84 (*) 0.00 - 1.49  TSH     Status: None   Collection Time    07/25/12  5:02 PM      Result Value Range   TSH 0.816  0.350 - 4.500 uIU/mL  TROPONIN I     Status: None   Collection Time    07/25/12  6:31 PM      Result Value Range   Troponin I <0.30  <0.30 ng/mL  TROPONIN I     Status: None   Collection Time    07/26/12 12:13 AM      Result Value Range   Troponin I <0.30  <0.30 ng/mL  CBC     Status: Abnormal   Collection Time    07/26/12 12:13 AM      Result Value Range   WBC 6.8  4.0 - 10.5 K/uL   RBC 3.66 (*) 3.87  - 5.11 MIL/uL   Hemoglobin 11.3 (*) 12.0 - 15.0 g/dL   HCT 16.1 (*) 09.6 - 04.5 %   MCV 91.0  78.0 - 100.0 fL   MCH 30.9  26.0 - 34.0 pg   MCHC 33.9  30.0 - 36.0 g/dL   RDW 40.9  81.1 - 91.4 %   Platelets 236  150 - 400 K/uL  PROTIME-INR     Status: Abnormal   Collection Time    07/26/12 12:13 AM      Result Value Range   Prothrombin Time 38.4 (*) 11.6 - 15.2 seconds   INR 4.27 (*) 0.00 - 1.49    Intake/Output Summary (Last 24 hours) at 07/26/12 0745 Last data filed at 07/26/12 0700  Gross per 24  hour  Intake   1630 ml  Output   3875 ml  Net  -2245 ml    ASSESSMENT AND PLAN:  Acute on chronic diastolic CHF:  Good urine output after Lasix IV yesterday.  I will give a low dose of PO today.   HYPERTENSION:  BP OK.  No additional therapies.    Atrial fibrillation:  Recurrent.  Failed Flecainide and now propafenone.  I will continue amiodarone IV for now.  Possible repeat cardioversion later during this admission.  Warfarin per pharmacy.  INR is elevated.   Acute on chronic dyspnea:  Possible acute pulmonary process/pneumonia. She has a history of COPD.  Day number 2 of antibiotics.  I will ask pulmonary to see the patient.      Fayrene Fearing [facility] 07/26/2012 7:45 AM

## 2012-07-27 ENCOUNTER — Encounter (HOSPITAL_COMMUNITY): Payer: Self-pay | Admitting: Internal Medicine

## 2012-07-27 ENCOUNTER — Inpatient Hospital Stay (HOSPITAL_COMMUNITY): Payer: Medicare Other

## 2012-07-27 LAB — PROTIME-INR
INR: 3.71 — ABNORMAL HIGH (ref 0.00–1.49)
Prothrombin Time: 34.6 seconds — ABNORMAL HIGH (ref 11.6–15.2)

## 2012-07-27 LAB — BASIC METABOLIC PANEL
Calcium: 8 mg/dL — ABNORMAL LOW (ref 8.4–10.5)
Chloride: 100 mEq/L (ref 96–112)
Creatinine, Ser: 1.7 mg/dL — ABNORMAL HIGH (ref 0.50–1.10)
GFR calc Af Amer: 30 mL/min — ABNORMAL LOW (ref >90)
GFR calc non Af Amer: 26 mL/min — ABNORMAL LOW (ref >90)

## 2012-07-27 MED ORDER — WARFARIN - PHARMACIST DOSING INPATIENT
Freq: Every day | Status: DC
  Administered 2012-08-02 – 2012-08-03 (×2)

## 2012-07-27 MED ORDER — PIPERACILLIN-TAZOBACTAM IN DEX 2-0.25 GM/50ML IV SOLN
2.2500 g | Freq: Three times a day (TID) | INTRAVENOUS | Status: DC
  Administered 2012-07-27 – 2012-08-03 (×22): 2.25 g via INTRAVENOUS
  Filled 2012-07-27 (×24): qty 50

## 2012-07-27 MED ORDER — ALPRAZOLAM 0.25 MG PO TABS
0.2500 mg | ORAL_TABLET | Freq: Three times a day (TID) | ORAL | Status: DC | PRN
  Administered 2012-07-29 – 2012-08-03 (×4): 0.25 mg via ORAL
  Filled 2012-07-27 (×4): qty 1

## 2012-07-27 MED ORDER — NYSTATIN 100000 UNIT/ML MT SUSP
5.0000 mL | Freq: Four times a day (QID) | OROMUCOSAL | Status: DC
  Administered 2012-07-27 – 2012-08-03 (×17): 500000 [IU] via ORAL
  Filled 2012-07-27 (×30): qty 5

## 2012-07-27 MED ORDER — AMIODARONE HCL 200 MG PO TABS
400.0000 mg | ORAL_TABLET | Freq: Two times a day (BID) | ORAL | Status: DC
  Administered 2012-07-27 – 2012-08-03 (×15): 400 mg via ORAL
  Filled 2012-07-27 (×19): qty 2

## 2012-07-27 MED ORDER — TEMAZEPAM 15 MG PO CAPS
15.0000 mg | ORAL_CAPSULE | Freq: Once | ORAL | Status: DC
  Filled 2012-07-27: qty 1

## 2012-07-27 NOTE — Progress Notes (Signed)
ANTICOAGULATION CONSULT NOTE - Follow Up Consult  Pharmacy Consult:  Coumadin Indication: atrial fibrillation  Allergies  Allergen Reactions  . Ciprofloxacin     unknown  . Quinolones     unknown  . Sulfonamide Derivatives     unknown  . Zolpidem Tartrate     Hallucinations    Patient Measurements: Height: 5\' 1"  (154.9 cm) Weight: 108 lb 3.2 oz (49.079 kg) (scale b) IBW/kg (Calculated) : 47.8   Vital Signs: Temp: 97.4 F (36.3 C) (03/03 0434) Temp src: Oral (03/03 0434) BP: 107/60 mmHg (03/03 0434) Pulse Rate: 120 (03/03 0434)  Labs:  Recent Labs  07/25/12 0502 07/25/12 1222 07/25/12 1702 07/25/12 1831 07/26/12 0013 07/26/12 0900 07/26/12 2000 07/27/12 0500  HGB 11.3*  --   --   --  11.3*  --   --   --   HCT 34.8*  --   --   --  33.3*  --   --   --   PLT 300  --   --   --  236  --   --   --   LABPROT  --   --  35.5*  --  38.4*  --   --  34.6*  INR  --   --  3.84*  --  4.27*  --   --  3.71*  CREATININE 1.41*  --   --   --   --  1.49* 1.82* 1.70*  TROPONINI  --  <0.30  --  <0.30 <0.30  --   --   --     Estimated Creatinine Clearance: 17.3 ml/min (by C-G formula based on Cr of 1.7).      Assessment: 67 YOF s/p TEE cardioversion on 07/24/12 to continue on Coumadin for Afib/Aflutter.  INR remains supra-therapeutic but has trended down.  Noted amiodarone could increase the effect of Coumadin.  No bleeding reported.   Goal of Therapy:  INR 2-3 Monitor platelets by anticoagulation protocol: Yes    Plan:  - Continue to hold Coumadin today - Change Zosyn to 2.25gm IV Q8H d/t decreased renal fxn - Daily PT / INR - Monitor renal fxn and adjust Zosyn as appropriate - Consider checking a magnesium level     Thuy D. Laney Potash, PharmD, BCPS Pager:  [phone] 07/27/2012, 10:41 AM

## 2012-07-27 NOTE — Telephone Encounter (Signed)
Pt had TEE guided cardioversion however is now back in the hospital

## 2012-07-27 NOTE — Progress Notes (Signed)
SUBJECTIVE:  Still coughing.  No pain.  Very weak.   PHYSICAL EXAM Filed Vitals:   07/26/12 1921 07/26/12 1948 07/26/12 2046 07/27/12 0434  BP:   96/70 107/60  Pulse:   129 120  Temp:   97.4 F (36.3 C) 97.4 F (36.3 C)  TempSrc:   Oral Oral  Resp:   18 18  Height:      Weight:    108 lb 3.2 oz (49.079 kg)  SpO2: 95% 97% 98% 100%   General:  Frail.  No acute distress Lungs:  Decreased breath sounds with upper airway wheezing Heart:  Irregular Abdomen:  Positive bowel sounds, no rebound no guarding Extremities:  No edema   LABS: Lab Results  Component Value Date   CKTOTAL 41 07/04/2011   CKMB 2.5 07/04/2011   TROPONINI <0.30 07/26/2012   Results for orders placed during the hospital encounter of 07/25/12 (from the past 24 hour(s))  BASIC METABOLIC PANEL     Status: Abnormal   Collection Time    07/26/12  9:00 AM      Result Value Range   Sodium 138  135 - 145 mEq/L   Potassium 3.1 (*) 3.5 - 5.1 mEq/L   Chloride 94 (*) 96 - 112 mEq/L   CO2 35 (*) 19 - 32 mEq/L   Glucose, Bld 179 (*) 70 - 99 mg/dL   BUN 21  6 - 23 mg/dL   Creatinine, Ser 1.61 (*) 0.50 - 1.10 mg/dL   Calcium 8.4  8.4 - 09.6 mg/dL   GFR calc non Af Amer 30 (*) >90 mL/min   GFR calc Af Amer 35 (*) >90 mL/min  BASIC METABOLIC PANEL     Status: Abnormal   Collection Time    07/26/12  8:00 PM      Result Value Range   Sodium 136  135 - 145 mEq/L   Potassium 4.1  3.5 - 5.1 mEq/L   Chloride 95 (*) 96 - 112 mEq/L   CO2 33 (*) 19 - 32 mEq/L   Glucose, Bld 106 (*) 70 - 99 mg/dL   BUN 28 (*) 6 - 23 mg/dL   Creatinine, Ser 0.45 (*) 0.50 - 1.10 mg/dL   Calcium 8.1 (*) 8.4 - 10.5 mg/dL   GFR calc non Af Amer 24 (*) >90 mL/min   GFR calc Af Amer 27 (*) >90 mL/min  PROTIME-INR     Status: Abnormal   Collection Time    07/27/12  5:00 AM      Result Value Range   Prothrombin Time 34.6 (*) 11.6 - 15.2 seconds   INR 3.71 (*) 0.00 - 1.49  BASIC METABOLIC PANEL     Status: Abnormal   Collection Time   07/27/12  5:00 AM      Result Value Range   Sodium 140  135 - 145 mEq/L   Potassium 3.9  3.5 - 5.1 mEq/L   Chloride 100  96 - 112 mEq/L   CO2 34 (*) 19 - 32 mEq/L   Glucose, Bld 91  70 - 99 mg/dL   BUN 25 (*) 6 - 23 mg/dL   Creatinine, Ser 4.09 (*) 0.50 - 1.10 mg/dL   Calcium 8.0 (*) 8.4 - 10.5 mg/dL   GFR calc non Af Amer 26 (*) >90 mL/min   GFR calc Af Amer 30 (*) >90 mL/min    Intake/Output Summary (Last 24 hours) at 07/27/12 0756 Last data filed at 07/26/12 2259  Gross per 24 hour  Intake 2056.58 ml  Output    875 ml  Net 1181.58 ml    ASSESSMENT AND PLAN:  Acute on chronic diastolic CHF: I/O incomplete.  With creat up I will hold the PO beta blocker  HYPERTENSION:  BP low.  No additional therapies.    Atrial fibrillation:  Recurrent.  Failed Flecainide and now propafenone.  I will continue amiodarone but change to PO.  Possible repeat cardioversion later during this admission.  Warfarin per pharmacy.  INR is elevated.   Acute on chronic dyspnea:  Still with cough.  I have consulted pulmonary for their recommendations.      Fayrene Fearing [facility] 07/27/2012 7:56 AM

## 2012-07-27 NOTE — Progress Notes (Signed)
Bladder scan pt obtained >411 and assisted her to [facility] and voided 300cc clear urine.  Encourage her to call for assistance when attempting to get to [facility]  Pt verbalized understanding.  Flavia Shipper PA informed. And stated will order UA.  Will continue to monitor.  [name], Charity fundraiser.

## 2012-07-27 NOTE — Progress Notes (Signed)
[facility]  Patient Status: Active (receiving services up to time of hospitalization)  [facility] is providing the following services: RN and HHA  If patient discharges after hours, please call [phone].   Jodene Nam 07/27/2012, 6:08 PM

## 2012-07-27 NOTE — Progress Notes (Signed)
Utilization Review Completed.   Kimberly Tucker, RN, BSN Nurse Case Manager  [phone]  

## 2012-07-27 NOTE — Evaluation (Signed)
Physical Therapy Evaluation Patient Details Name: Julie Rich MRN: [id number] DOB: June 09, 1921 Today's Date: 07/27/2012 Time: 7829-5621 PT Time Calculation (min): 15 min  PT Assessment / Plan / Recommendation Clinical Impression  79 y/o remale with recent cardioversion 3/1 admit for sob. Presents to PT with below impairments affecting mobility and independence. Will benefit physical therapy in the acute setting to maximize functional independence for safe d/c home. Eval limited by elevated HR 120-140.     PT Assessment  Patient needs continued PT services    Follow Up Recommendations  Home health PT;Supervision for mobility/OOB    Does the patient have the potential to tolerate intense rehabilitation      Barriers to Discharge        Equipment Recommendations  Rolling walker with 5" wheels    Recommendations for Other Services     Frequency Min 3X/week    Precautions / Restrictions Precautions Precautions: Fall Restrictions Weight Bearing Restrictions: No   Pertinent Vitals/Pain HR a-fib 120-140 during eval, limited to standing and side stepping EOB      Mobility  Bed Mobility Bed Mobility: Supine to Sit;Sit to Supine Supine to Sit: 6: Modified independent (Device/Increase time);With rails;HOB elevated (20 degrees) Sit to Supine: 6: Modified independent (Device/Increase time);HOB flat;With rail Transfers Transfers: Sit to Stand;Stand to Sit Sit to Stand: 5: Supervision;From bed;With upper extremity assist Stand to Sit: 5: Supervision;To bed;With upper extremity assist Details for Transfer Assistance: supervision for stability, pt appears generally weak moving slowly and definite need of upper extremities to help, backs of legs on the bed to assist with stability Ambulation/Gait Ambulation/Gait Assistance: 4: Min guard Ambulation Distance (Feet): 2 Feet Ambulation/Gait Assistance Details: 2 side steps EOB mingaurdA with RUE on bed rail for support         PT  Diagnosis: Difficulty walking;Generalized weakness  PT Problem List: Decreased strength;Decreased activity tolerance;Decreased balance;Decreased mobility;Cardiopulmonary status limiting activity;Decreased safety awareness PT Treatment Interventions: DME instruction;Gait training;Stair training;Functional mobility training;Therapeutic exercise;Balance training;Therapeutic activities;Neuromuscular re-education;Patient/family education   PT Goals Acute Rehab PT Goals PT Goal Formulation: With patient Time For Goal Achievement: 08/03/12 Potential to Achieve Goals: Good Pt will go Sit to Stand: with modified independence PT Goal: Sit to Stand - Progress: Goal set today Pt will go Stand to Sit: with modified independence PT Goal: Stand to Sit - Progress: Goal set today Pt will Transfer Bed to Chair/Chair to Bed: with modified independence PT Transfer Goal: Bed to Chair/Chair to Bed - Progress: Goal set today Pt will Ambulate: 51 - 150 feet;with modified independence;with least restrictive assistive device PT Goal: Ambulate - Progress: Goal set today Pt will Go Up / Down Stairs: 3-5 stairs;with supervision;with rail(s) PT Goal: Up/Down Stairs - Progress: Goal set today Pt will Perform Home Exercise Program: Independently PT Goal: Perform Home Exercise Program - Progress: Goal set today  Visit Information  Last PT Received On: 07/27/12 Assistance Needed: +1    Subjective Data  Subjective: Before you go yanking me up, I've just been up. Patient Stated Goal: home   Prior Functioning  Home Living Lives With: Alone Available Help at Discharge: Family;Available PRN/intermittently Type of Home: House Home Access: Stairs to enter Entrance Stairs-Number of Steps: 4 Home Layout: One level Home Adaptive Equipment: Walker - rolling Prior Function Comments: was recently in the hospital and sent home with RW and HHPT, only home for 2 days so PT hadn't gotten there yet; daughter was staying with  her this d/c but she can't stay with her  this time Communication Communication: No difficulties    Cognition  Cognition Overall Cognitive Status: Appears within functional limits for tasks assessed/performed Arousal/Alertness: Awake/alert Orientation Level: Appears intact for tasks assessed Behavior During Session: [facility] for tasks performed    Extremity/Trunk Assessment Right Upper Extremity Assessment RUE ROM/Strength/Tone: [facility] for tasks assessed Left Upper Extremity Assessment LUE ROM/Strength/Tone: WFL for tasks assessed Right Lower Extremity Assessment RLE ROM/Strength/Tone: Deficits RLE ROM/Strength/Tone Deficits: generally weak grossly 4/5 Left Lower Extremity Assessment LLE ROM/Strength/Tone: Deficits LLE ROM/Strength/Tone Deficits: generally weak, grossly 4/5 Trunk Assessment Trunk Assessment: Normal   Balance    End of Session PT - End of Session Equipment Utilized During Treatment: Gait belt Activity Tolerance: Patient tolerated treatment well Patient left: in bed Nurse Communication: Mobility status  GP     [facility] HELEN 07/27/2012, 2:17 PM

## 2012-07-27 NOTE — Progress Notes (Signed)
Called and notified Matt that pt can be transported with tele to c x-ray , our Charge nurse will come down with pt.  Instructed that transport will be up on the floor to pick her up.  [name], Charity fundraiser.

## 2012-07-28 LAB — BASIC METABOLIC PANEL
BUN: 21 mg/dL (ref 6–23)
CO2: 38 mEq/L — ABNORMAL HIGH (ref 19–32)
Calcium: 8.5 mg/dL (ref 8.4–10.5)
GFR calc non Af Amer: 32 mL/min — ABNORMAL LOW (ref >90)
Glucose, Bld: 88 mg/dL (ref 70–99)

## 2012-07-28 MED ORDER — WARFARIN SODIUM 4 MG PO TABS
4.0000 mg | ORAL_TABLET | Freq: Once | ORAL | Status: AC
  Administered 2012-07-28: 4 mg via ORAL
  Filled 2012-07-28: qty 1

## 2012-07-28 NOTE — Progress Notes (Addendum)
ANTICOAGULATION CONSULT NOTE - Follow Up Consult  Pharmacy Consult:  Coumadin Indication: atrial fibrillation  Allergies  Allergen Reactions  . Ciprofloxacin     unknown  . Quinolones     unknown  . Sulfonamide Derivatives     unknown  . Zolpidem Tartrate     Hallucinations    Patient Measurements: Height: 5\' 1"  (154.9 cm) Weight: 108 lb 11.2 oz (49.306 kg) (b scale) IBW/kg (Calculated) : 47.8   Vital Signs: Temp: 97.4 F (36.3 C) (03/04 0416) Temp src: Oral (03/04 0416) BP: 113/65 mmHg (03/04 0416) Pulse Rate: 130 (03/04 0416)  Labs:  Recent Labs  07/25/12 1222  07/25/12 1831 07/26/12 0013  07/26/12 2000 07/27/12 0500 07/28/12 0500  HGB  --   --   --  11.3*  --   --   --   --   HCT  --   --   --  33.3*  --   --   --   --   PLT  --   --   --  236  --   --   --   --   LABPROT  --   < >  --  38.4*  --   --  34.6* 23.8*  INR  --   < >  --  4.27*  --   --  3.71* 2.24*  CREATININE  --   --   --   --   < > 1.82* 1.70* 1.42*  TROPONINI <0.30  --  <0.30 <0.30  --   --   --   --   < > = values in this interval not displayed.  Estimated Creatinine Clearance: 20.7 ml/min (by C-G formula based on Cr of 1.42).      Assessment: 67 YOF s/p TEE cardioversion on 07/24/12 to continue on Coumadin for Afib/Aflutter.  INR decreased to therapeutic level post Coumadin held since 07/25/12.  Expect INR to decrease further tomorrow.  Noted amiodarone could increase the effect of Coumadin.  No bleeding reported.   Goal of Therapy:  INR 2-3 Monitor platelets by anticoagulation protocol: Yes    Plan:  - Coumadin 4mg  PO today - Continue Zosyn at 2.25gm IV Q8H, will adjust as renal fxn improves further - Daily PT / INR - F/U KCL supplementation - Consider checking a magnesium level     Bertran Zeimet D. Laney Potash, PharmD, BCPS Pager:  [phone] 07/28/2012, 10:26 AM

## 2012-07-28 NOTE — Progress Notes (Signed)
PT Cancellation Note  Patient Details Name: Julie Rich MRN: [id number] DOB: Nov 01, 1921   Cancelled Treatment:    Reason Eval/Treat Not Completed: Medical issues which prohibited therapy (HR 180 at rest)  Delaney Meigs, PT [phone]

## 2012-07-28 NOTE — Progress Notes (Signed)
Pt's k+ 3.1 C Berge, PA on call paged.  Shanda Bumps, RN informed to expect call.  [name], Charity fundraiser.

## 2012-07-29 LAB — URINALYSIS, ROUTINE W REFLEX MICROSCOPIC
Ketones, ur: NEGATIVE mg/dL
Nitrite: NEGATIVE
Protein, ur: NEGATIVE mg/dL
Urobilinogen, UA: 0.2 mg/dL (ref 0.0–1.0)

## 2012-07-29 LAB — BASIC METABOLIC PANEL
Calcium: 9.1 mg/dL (ref 8.4–10.5)
Creatinine, Ser: 1.11 mg/dL — ABNORMAL HIGH (ref 0.50–1.10)
GFR calc non Af Amer: 43 mL/min — ABNORMAL LOW (ref >90)
Glucose, Bld: 99 mg/dL (ref 70–99)
Sodium: 144 mEq/L (ref 135–145)

## 2012-07-29 LAB — URINE MICROSCOPIC-ADD ON

## 2012-07-29 MED ORDER — DILTIAZEM HCL 30 MG PO TABS
30.0000 mg | ORAL_TABLET | Freq: Three times a day (TID) | ORAL | Status: DC
  Administered 2012-07-29 – 2012-07-30 (×4): 30 mg via ORAL
  Filled 2012-07-29 (×10): qty 1

## 2012-07-29 MED ORDER — POTASSIUM CHLORIDE CRYS ER 20 MEQ PO TBCR
40.0000 meq | EXTENDED_RELEASE_TABLET | Freq: Once | ORAL | Status: AC
  Administered 2012-07-29: 40 meq via ORAL

## 2012-07-29 MED ORDER — WARFARIN SODIUM 4 MG PO TABS
4.0000 mg | ORAL_TABLET | Freq: Once | ORAL | Status: AC
  Administered 2012-07-29: 4 mg via ORAL
  Filled 2012-07-29: qty 1

## 2012-07-29 NOTE — Progress Notes (Signed)
MD told RN to determine O2 dependency during ambulation during the day and to ensure that PT works with the patient.  RN reported this information to the oncoming day shift nurse.

## 2012-07-29 NOTE — Progress Notes (Signed)
   SUBJECTIVE:  Coughing improved.   No pain.  Very weak.   PHYSICAL EXAM Filed Vitals:   07/28/12 1450 07/28/12 2004 07/28/12 2155 07/29/12 0503  BP: 121/60  143/75 126/79  Pulse: 115  142 110  Temp: 97.5 F (36.4 C)  98.4 F (36.9 C) 97.4 F (36.3 C)  TempSrc: Oral  Oral Oral  Resp: 18  19 18   Height:      Weight:    113 lb 3.2 oz (51.347 kg)  SpO2: 98% 100% 95% 100%   General:  Frail.  No acute distress Lungs:  No wheezing Heart:  Irregular Abdomen:  Positive bowel sounds, no rebound no guarding Extremities:  No edema   LABS: Lab Results  Component Value Date   CKTOTAL 41 07/04/2011   CKMB 2.5 07/04/2011   TROPONINI <0.30 07/26/2012   Results for orders placed during the hospital encounter of 07/25/12 (from the past 24 hour(s))  URINALYSIS, ROUTINE W REFLEX MICROSCOPIC     Status: Abnormal   Collection Time    07/29/12  2:04 AM      Result Value Range   Color, Urine YELLOW  YELLOW   APPearance CLOUDY (*) CLEAR   Specific Gravity, Urine 1.018  1.005 - 1.030   pH 5.0  5.0 - 8.0   Glucose, UA NEGATIVE  NEGATIVE mg/dL   Hgb urine dipstick NEGATIVE  NEGATIVE   Bilirubin Urine NEGATIVE  NEGATIVE   Ketones, ur NEGATIVE  NEGATIVE mg/dL   Protein, ur NEGATIVE  NEGATIVE mg/dL   Urobilinogen, UA 0.2  0.0 - 1.0 mg/dL   Nitrite NEGATIVE  NEGATIVE   Leukocytes, UA LARGE (*) NEGATIVE  URINE MICROSCOPIC-ADD ON     Status: Abnormal   Collection Time    07/29/12  2:04 AM      Result Value Range   Squamous Epithelial / LPF RARE  RARE   WBC, UA 21-50  <3 WBC/hpf   RBC / HPF 3-6  <3 RBC/hpf   Bacteria, UA RARE  RARE   Casts HYALINE CASTS (*) NEGATIVE   Urine-Other RARE YEAST      Intake/Output Summary (Last 24 hours) at 07/29/12 0641 Last data filed at 07/29/12 0445  Gross per 24 hour  Intake    680 ml  Output   1125 ml  Net   -445 ml    ASSESSMENT AND PLAN:  Acute on chronic diastolic CHF:  Keeping I/O even.  Creat pending.    HYPERTENSION:  BP no longer low.  See  below  Atrial fibrillation:  Recurrent.  Failed Flecainide and now propafenone.  I will continue amiodarone.   Warfarin per pharmacy.  INR is subtherapeutic. Plan DCCV in 3 -4 weeks after she has had a therapeutic INR x 3 wks. I will start low dose Cardizem today.  Acute on chronic dyspnea:  Cough improved.  On day 5 of atb.    Disposition:  I need PT to work with her and I need sats recorded with ambulation on RA.     Fayrene Fearing [facility] 07/29/2012 6:41 AM

## 2012-07-29 NOTE — Progress Notes (Signed)
ANTICOAGULATION CONSULT NOTE - Follow Up Consult  Pharmacy Consult:  Coumadin Indication: atrial fibrillation  Allergies  Allergen Reactions  . Ciprofloxacin     unknown  . Quinolones     unknown  . Sulfonamide Derivatives     unknown  . Zolpidem Tartrate     Hallucinations    Patient Measurements: Height: 5\' 1"  (154.9 cm) Weight: 113 lb 3.2 oz (51.347 kg) (scale b) IBW/kg (Calculated) : 47.8   Vital Signs: Temp: 97.4 F (36.3 C) (03/05 0503) Temp src: Oral (03/05 0503) BP: 126/79 mmHg (03/05 0503) Pulse Rate: 110 (03/05 0503)  Labs:  Recent Labs  07/26/12 2000 07/27/12 0500 07/28/12 0500 07/29/12 0500  LABPROT  --  34.6* 23.8* 20.0*  INR  --  3.71* 2.24* 1.77*  CREATININE 1.82* 1.70* 1.42*  --     Estimated Creatinine Clearance: 20.7 ml/min (by C-G formula based on Cr of 1.42).      Assessment: 43 YOF s/p TEE cardioversion on 07/24/12 to continue on Coumadin for Afib/Aflutter.  INR decreased further to sub-therapeutic level today post Coumadin resumed yesterday.  Noted amiodarone could increase the effect of Coumadin and MD plans to cardiovert patient in 3-4 weeks.  No bleeding reported.   Goal of Therapy:  INR 2-3 Monitor platelets by anticoagulation protocol: Yes    Plan:  - Repeat Coumadin 4mg  PO today - Continue Zosyn at 2.25gm IV Q8H, will adjust as renal fxn improves further - Daily PT / INR - F/U KCL supplementation (K+ 3.2 on 07/28/12) - Consider checking a magnesium level     Sreenidhi Ganson D. Laney Potash, PharmD, BCPS Pager:  [phone] 07/29/2012, 9:21 AM

## 2012-07-29 NOTE — Progress Notes (Signed)
Physical Therapy Treatment Patient Details Name: Julie Rich MRN: [id number] DOB: 01/28/1922 Today's Date: 07/29/2012 Time: 4098-1191 PT Time Calculation (min): 31 min  PT Assessment / Plan / Recommendation Comments on Treatment Session  Pt improving with mobility.  Concerned about when she is going home and what might be still keeping her here.  Sats on RA before mobility were lower than during or post mobility.  Initially resting HR 110/120's sats on RA after . 90/91%; During gait sats on RA 95% , EHR 110's upper 120's, After gait while rest at EOB, sats consistently 97% HR mid 110's.  Feel pt should receive HHPT home safety and treat as needed.    Follow Up Recommendations  Home health PT;Supervision for mobility/OOB     Does the patient have the potential to tolerate intense rehabilitation     Barriers to Discharge        Equipment Recommendations  Rolling walker with 5" wheels    Recommendations for Other Services    Frequency Min 3X/week   Plan Discharge plan remains appropriate;Frequency remains appropriate    Precautions / Restrictions Precautions Precautions: Fall Restrictions Weight Bearing Restrictions: No   Pertinent Vitals/Pain See vitals in comments     Mobility  Bed Mobility Bed Mobility: Supine to Sit;Sit to Supine Supine to Sit: 6: Modified independent (Device/Increase time);With rails;HOB elevated Sit to Supine: 6: Modified independent (Device/Increase time);HOB flat;With rail Details for Bed Mobility Assistance: safe mobility Transfers Transfers: Sit to Stand;Stand to Sit Sit to Stand: 6: Modified independent (Device/Increase time);With upper extremity assist;From bed Stand to Sit: 6: Modified independent (Device/Increase time);With upper extremity assist;To bed (supervision from low chair) Details for Transfer Assistance: safer mobility, but appears weak Ambulation/Gait Ambulation/Gait Assistance: 5: Supervision Ambulation Distance (Feet): 90 Feet  (times 2) Assistive device: Rolling walker Ambulation/Gait Assistance Details: generally steady gait; slow deliberate cadence Gait Pattern: Step-through pattern;Decreased step length - right;Decreased step length - left;Decreased stride length Stairs: No    Exercises     PT Diagnosis:    PT Problem List:   PT Treatment Interventions:     PT Goals Acute Rehab PT Goals Time For Goal Achievement: 08/03/12 Potential to Achieve Goals: Good Pt will go Sit to Stand: with modified independence PT Goal: Sit to Stand - Progress: Met Pt will go Stand to Sit: with modified independence PT Goal: Stand to Sit - Progress: Progressing toward goal Pt will Transfer Bed to Chair/Chair to Bed: with modified independence PT Transfer Goal: Bed to Chair/Chair to Bed - Progress: Progressing toward goal Pt will Ambulate: 51 - 150 feet;with modified independence;with least restrictive assistive device PT Goal: Ambulate - Progress: Progressing toward goal Pt will Go Up / Down Stairs: 6-9 stairs Pt will Perform Home Exercise Program: Independently PT Goal: Perform Home Exercise Program - Progress: Not met  Visit Information  Last PT Received On: 07/29/12 Assistance Needed: +1    Subjective Data  Subjective: I haven't got up to walk since I've been here.   Cognition  Cognition Overall Cognitive Status: Appears within functional limits for tasks assessed/performed Arousal/Alertness: Awake/alert Orientation Level: Appears intact for tasks assessed Behavior During Session: [facility] for tasks performed    Balance  Balance Balance Assessed: Yes Static Sitting Balance Static Sitting - Balance Support: Feet supported;No upper extremity supported Static Sitting - Level of Assistance: 7: Independent Static Sitting - Comment/# of Minutes: 15 minutes while therapist discussed some medical points given in chart from a laymen perspective  End of Session PT -  End of Session Equipment Utilized During Treatment:  Gait belt Activity Tolerance: Patient tolerated treatment well Patient left: in bed Nurse Communication: Mobility status   GP     Mottinger, Eliseo Gum 07/29/2012, 5:59 PM 07/29/2012  [place] Bing, PT [phone] (pager)

## 2012-07-29 NOTE — Progress Notes (Signed)
07/29/12 1135 In to speak with pt. about [facility] services, and pt. is currently being seen by [facility] for [facility] RN and [facility] Aide.  PT is recommending HH PT as well.  Physician please write for [facility] PT, and resume HH RN and HH Aide.  Pt. may be dc home tomorrow.  Tera Mater, RN, BSN NCM [phone]

## 2012-07-30 ENCOUNTER — Telehealth: Payer: Self-pay | Admitting: Cardiology

## 2012-07-30 LAB — BASIC METABOLIC PANEL
BUN: 19 mg/dL (ref 6–23)
CO2: 36 mEq/L — ABNORMAL HIGH (ref 19–32)
Calcium: 8.7 mg/dL (ref 8.4–10.5)
Chloride: 104 mEq/L (ref 96–112)
Creatinine, Ser: 1.12 mg/dL — ABNORMAL HIGH (ref 0.50–1.10)

## 2012-07-30 LAB — PROTIME-INR: INR: 1.93 — ABNORMAL HIGH (ref 0.00–1.49)

## 2012-07-30 MED ORDER — DILTIAZEM HCL 60 MG PO TABS
60.0000 mg | ORAL_TABLET | Freq: Three times a day (TID) | ORAL | Status: DC
  Administered 2012-07-30 – 2012-08-02 (×9): 60 mg via ORAL
  Filled 2012-07-30 (×13): qty 1

## 2012-07-30 MED ORDER — WARFARIN SODIUM 3 MG PO TABS
3.0000 mg | ORAL_TABLET | Freq: Once | ORAL | Status: AC
  Administered 2012-07-30: 3 mg via ORAL
  Filled 2012-07-30: qty 1

## 2012-07-30 MED ORDER — OXYMETAZOLINE HCL 0.05 % NA SOLN
1.0000 | Freq: Two times a day (BID) | NASAL | Status: DC
  Filled 2012-07-30: qty 15

## 2012-07-30 MED ORDER — OXYMETAZOLINE HCL 0.05 % NA SOLN
1.0000 | Freq: Two times a day (BID) | NASAL | Status: DC | PRN
  Administered 2012-07-30: 1 via NASAL
  Filled 2012-07-30: qty 15

## 2012-07-30 NOTE — Telephone Encounter (Signed)
We have never seen this pt in coumadin clinic and she is still in the hospital.

## 2012-07-30 NOTE — Progress Notes (Signed)
   TELEMETRY: Reviewed telemetry pt in atrial fibrillation with rate sustaining in the 120s: Filed Vitals:   07/29/12 2013 07/29/12 2130 07/29/12 2231 07/30/12 0441  BP:  119/61 126/63 134/82  Pulse:  84 124 124  Temp:  97.9 F (36.6 C)  97.5 F (36.4 C)  TempSrc:  Oral  Oral  Resp:  20  18  Height:      Weight:    111 lb 8.8 oz (50.6 kg)  SpO2: 95% 99%  98%    Intake/Output Summary (Last 24 hours) at 07/30/12 0746 Last data filed at 07/30/12 2956  Gross per 24 hour  Intake   1350 ml  Output   1100 ml  Net    250 ml    SUBJECTIVE Developed mild nose bleed this am. Feels weak. Denies SOB or chest pain.  LABS: Basic Metabolic Panel:  Recent Labs  21/30/86 1218 07/30/12 0500  NA 144 144  K 3.6 3.9  CL 101 104  CO2 34* 36*  GLUCOSE 99 92  BUN 21 19  CREATININE 1.11* 1.12*  CALCIUM 9.1 8.7   CBC: No results found for this basename: WBC, NEUTROABS, HGB, HCT, MCV, PLT,  in the last 72 hours  Radiology/Studies:  X-ray Chest Pa And Lateral   07/27/2012  *RADIOLOGY REPORT*  Clinical Data: Follow-up congestive heart failure.  Possible pneumonia.  CHEST - 2 VIEW  Comparison: One-view chest 07/25/2012  Findings: The heart is mildly enlarged.  The bilateral pleural effusions persist, worse on the left.  Mild bibasilar atelectasis is present.  Basilar airspace disease is improved.  The overall lung volumes have improved.  A right-sided PICC line is stable.  IMPRESSION:  1.  Improved aeration of both lung bases with mild persistent atelectasis, worse on the left. 2.  Persistent small bilateral pleural effusions, worse on the left.   Original Report Authenticated By: Marin Roberts, M.D.    26-Jul-2012 06:53:40 [facility]-CCU ROUTINE RECORD Atrial fibrillation with rapid ventricular response Minimal voltage criteria for LVH, may be normal variant Marked ST abnormality, possible lateral subendocardial injury Abnormal ECG  PHYSICAL EXAM General:  Elderly,  frail, NAD Head: Normocephalic, atraumatic, sclera non-icteric, no xanthomas, mild epistaxis. Neck: Negative for carotid bruits. JVD not elevated. Lungs: right basilar rales. Breathing is unlabored. Heart: IRRR S1 S2 without murmurs, rubs, or gallops.  Abdomen: Soft, non-tender, non-distended with normoactive bowel sounds. No hepatomegaly. No rebound/guarding. No obvious abdominal masses. Msk:  Strength and tone appears normal for age. Extremities: No clubbing, cyanosis or edema.  Distal pedal pulses are 2+ and equal bilaterally. Neuro: Alert and oriented X 3. Moves all extremities spontaneously. Psych:  Responds to questions appropriately with a normal affect.  ASSESSMENT AND PLAN: 1. Atrial fibrillation with RVR. Loading oral amiodarone. Failed therapy with flecainide and propafenone. Plan DCCV in 3-4 weeks after amio load. Needs better rate control- will increase diltiazem to 60 mg qid. Not ready for DC yet. 2. Anticoagulation INR 1.93.  3. Acute on chronic dyspnea due to bronchitis and diastolic CHF. I/O balanced, weight stable. Continue lasix. Complete course of antibiotics.  4. Epistaxis. Mucosa dry due to oxygen therapy. Will DC oxygen. Stop ASA now that she is on coumadin.  Principal Problem:   Diastolic CHF, chronic Active Problems:   HYPERTENSION   Atrial fibrillation   Dyspnea   Chest pain   COPD exacerbation   Acute diastolic heart failure    Signed, Peter [place] MD,FACC 07/30/2012 7:54 AM

## 2012-07-30 NOTE — Telephone Encounter (Signed)
New problem     Daughter is aware that Julie Rich is off today .   Discuss coumadin level . And why Rythmol was discontinue.

## 2012-07-30 NOTE — Telephone Encounter (Signed)
i left a msg, told her to speak with Dr at hospital and i will forward to dr/nurse

## 2012-07-30 NOTE — Progress Notes (Signed)
Physical Therapy Treatment Patient Details Name: Julie Rich MRN: [id number] DOB: 05-05-1922 Today's Date: 07/30/2012 Time: 4098-1191 PT Time Calculation (min): 25 min  PT Assessment / Plan / Recommendation Comments on Treatment Session  79 y/o female adm. for sob and elevated HR. Has progressively gotten weaker due to immobility. After long conversation with patient she appears more agreeable to SNF. I just don't feel she has the tolerance to manage living alone at this time. Spoke with CSW and she is aware of d/c rec of SNF.     Follow Up Recommendations  SNF     Does the patient have the potential to tolerate intense rehabilitation     Barriers to Discharge        Equipment Recommendations  Rolling walker with 5" wheels    Recommendations for Other Services    Frequency Min 3X/week   Plan Discharge plan needs to be updated;Frequency remains appropriate    Precautions / Restrictions Precautions Precautions: Fall Restrictions Weight Bearing Restrictions: No   Pertinent Vitals/Pain HR fluctuating between 104-149 during activity today requiring several rest breaks throughout session    Mobility  Bed Mobility Bed Mobility: Supine to Sit Supine to Sit: 4: Min assist Details for Bed Mobility Assistance: pt pulling on therapists arm to bring trunk upright from bed, slow to move due to weakness Transfers Transfers: Sit to Stand;Stand to Sit;Stand Pivot Transfers Sit to Stand: 4: Min assist;With upper extremity assist;From bed Stand to Sit: 4: Min assist;With upper extremity assist;To bed;To chair/3-in-1;With armrests Stand Pivot Transfers:  (with RW bed->3in1) Details for Transfer Assistance: verbal cues for safe hand placement, min stability assist to stand, lower extremities very weak Ambulation/Gait Ambulation/Gait Assistance: Not tested (comment) (HR too elevated today)    Exercises General Exercises - Lower Extremity Long Arc Quad: AROM;Both;10 reps;Seated    PT  Goals Acute Rehab PT Goals PT Goal: Sit to Stand - Progress: Progressing toward goal PT Goal: Stand to Sit - Progress: Progressing toward goal PT Goal: Perform Home Exercise Program - Progress: Progressing toward goal  Visit Information  Last PT Received On: 07/30/12 Assistance Needed: +1    Subjective Data  Subjective: I am just so weak. Patient Stated Goal: home   Cognition  Cognition Overall Cognitive Status: Appears within functional limits for tasks assessed/performed Arousal/Alertness: Awake/alert Orientation Level: Appears intact for tasks assessed Behavior During Session: [facility] for tasks performed    Balance  Static Standing Balance Static Standing - Balance Support: Bilateral upper extremity supported Static Standing - Level of Assistance: 4: Min assist Static Standing - Comment/# of Minutes: pt attempted marching in standing x10 with minA to steady as legs began to buckle, HR fluctuating between 110-140 during activity  End of Session PT - End of Session Equipment Utilized During Treatment: Gait belt Activity Tolerance: Patient limited by fatigue;Treatment limited secondary to medical complications (Comment) (HR too elevated) Patient left:  (on 3in1, nurse tech present) Nurse Communication: Mobility status;Other (comment) (d/c recommendation change)   GP     Julie Rich,Julie Rich 07/30/2012, 4:30 PM

## 2012-07-30 NOTE — Progress Notes (Signed)
ANTICOAGULATION CONSULT NOTE - Follow Up Consult  Pharmacy Consult for Coumadin Indication: atrial fibrillation  Patient Measurements: Height: 5\' 1"  (154.9 cm) Weight: 111 lb 8.8 oz (50.6 kg) (atanding scale b) IBW/kg (Calculated) : 47.8  Vital Signs: Temp: 97.5 F (36.4 C) (03/06 0441) Temp src: Oral (03/06 0441) BP: 134/82 mmHg (03/06 0441) Pulse Rate: 124 (03/06 0441)  Labs:  Recent Labs  07/28/12 0500 07/29/12 0500 07/29/12 1218 07/30/12 0500  LABPROT 23.8* 20.0*  --  21.3*  INR 2.24* 1.77*  --  1.93*  CREATININE 1.42*  --  1.11* 1.12*    Estimated Creatinine Clearance: 26.2 ml/min (by C-G formula based on Cr of 1.12).  Assessment:  s/p TEE cardioversion 2/28 for afib/flutter.  Admssion INR was 3.84 on home Coumadin regimen of 4 mg MWFSun and 2 mg TTS. Up to 4.27 on 3/1.  Coumadin held 3/1-3/3.  Resumed on 3/4. Has had Coumadin 4 mg daily x 2 days. Nosebleed this am, oxygen discontinued. Aspirin 81 mg daily also discontinued. Amiodarone begun 3/1.  Expecting lower Coumadin requirements than prior to admission.  Goal of Therapy:  INR 2-3 Monitor platelets by anticoagulation protocol: Yes   Plan:   Coumadin 3 mg today.  Continue daily PT/INR for now.  CBC in am.  Follow-up for any further nosebleed.  Dennie Fetters, [place] Pager: [phone] 07/30/2012,12:05 PM

## 2012-07-30 NOTE — Telephone Encounter (Signed)
Family members call was to discuss coumadin, she is aware of why rhyhtmol was discontinued. Told her I will forward to Coumadin clinic.

## 2012-07-31 LAB — CBC
HCT: 32.3 % — ABNORMAL LOW (ref 36.0–46.0)
Hemoglobin: 10.5 g/dL — ABNORMAL LOW (ref 12.0–15.0)
MCH: 30.4 pg (ref 26.0–34.0)
MCHC: 32.5 g/dL (ref 30.0–36.0)
MCV: 93.6 fL (ref 78.0–100.0)
RBC: 3.45 MIL/uL — ABNORMAL LOW (ref 3.87–5.11)

## 2012-07-31 LAB — PROTIME-INR: Prothrombin Time: 21.7 seconds — ABNORMAL HIGH (ref 11.6–15.2)

## 2012-07-31 MED ORDER — WARFARIN SODIUM 4 MG PO TABS
4.0000 mg | ORAL_TABLET | Freq: Once | ORAL | Status: AC
  Administered 2012-07-31: 4 mg via ORAL
  Filled 2012-07-31: qty 1

## 2012-07-31 NOTE — Progress Notes (Signed)
ANTICOAGULATION CONSULT NOTE - Follow Up Consult  Pharmacy Consult for Coumadin Indication: atrial fibrillation  Patient Measurements: Height: 5\' 1"  (154.9 cm) Weight: 110 lb 3.2 oz (49.986 kg) (b scale) IBW/kg (Calculated) : 47.8  Vital Signs: Temp: 97.5 F (36.4 C) (03/07 1448) Temp src: Oral (03/07 1448) BP: 129/72 mmHg (03/07 1448) Pulse Rate: 85 (03/07 1448)  Labs:  Recent Labs  07/29/12 0500 07/29/12 1218 07/30/12 0500 07/31/12 0500  HGB  --   --   --  10.5*  HCT  --   --   --  32.3*  PLT  --   --   --  299  LABPROT 20.0*  --  21.3* 21.7*  INR 1.77*  --  1.93* 1.98*  CREATININE  --  1.11* 1.12*  --     Estimated Creatinine Clearance: 26.2 ml/min (by C-G formula based on Cr of 1.12).  Assessment:  s/p TEE cardioversion 2/28 for afib/flutter.  Admssion INR was 3.84 on home Coumadin regimen of 4 mg MWFSun and 2 mg TTS. Up to 4.27 on 3/1.  Coumadin held 3/1-3/3.  Resumed on 3/4. Has had Coumadin 4 mg daily x 2 days, then 3 mg yesterday.  CBC has trended down a bit. Nosebleed 3/6, oxygen discontinued. No further nosebleeds. Aspirin 81 mg daily discontinued 3/6.  Discussed with daughter, [name]. She was not on Aspirin at home, but has had occasional nosebleeds in the past.  Daughter also relates that [facility] tries to keep her INR 2.5-3.0. Amiodarone begun 3/1.  Expecting lower Coumadin requirements than prior to admission due to high INR on admit and addition of amiodarone.  Goal of Therapy:  INR 2-3, but prefer 2.5-3.0 Monitor platelets by anticoagulation protocol: Yes   Plan:   Coumadin 4 mg today (usual Friday dose).  Continue daily PT/INR for now.  Follow up for any further nosebleeds.  Will recheck CBC by 3/10.   Dennie Fetters, [place] Pager: [phone] 07/31/2012,3:09 PM

## 2012-07-31 NOTE — Progress Notes (Signed)
   SUBJECTIVE:  Coughing now is almost completely resolved.   No pain.  Very weak.   PHYSICAL EXAM Filed Vitals:   07/31/12 0336 07/31/12 0438 07/31/12 0806 07/31/12 1021  BP:  140/57  122/49  Pulse:  127  102  Temp:  97.5 F (36.4 C)  97.5 F (36.4 C)  TempSrc:  Oral  Oral  Resp:  20  18  Height:      Weight: 110 lb 3.2 oz (49.986 kg)     SpO2:  97% 96% 96%   General:  Frail.  No acute distress Lungs:  No wheezing Heart:  Irregular Abdomen:  Positive bowel sounds, no rebound no guarding Extremities:  No edema Neuro:  Nonfocal   LABS:  Results for orders placed during the hospital encounter of 07/25/12 (from the past 24 hour(s))  PROTIME-INR     Status: Abnormal   Collection Time    07/31/12  5:00 AM      Result Value Range   Prothrombin Time 21.7 (*) 11.6 - 15.2 seconds   INR 1.98 (*) 0.00 - 1.49  CBC     Status: Abnormal   Collection Time    07/31/12  5:00 AM      Result Value Range   WBC 5.5  4.0 - 10.5 K/uL   RBC 3.45 (*) 3.87 - 5.11 MIL/uL   Hemoglobin 10.5 (*) 12.0 - 15.0 g/dL   HCT 40.9 (*) 81.1 - 91.4 %   MCV 93.6  78.0 - 100.0 fL   MCH 30.4  26.0 - 34.0 pg   MCHC 32.5  30.0 - 36.0 g/dL   RDW 78.2  95.6 - 21.3 %   Platelets 299  150 - 400 K/uL    Intake/Output Summary (Last 24 hours) at 07/31/12 1039 Last data filed at 07/31/12 0915  Gross per 24 hour  Intake    720 ml  Output   1652 ml  Net   -932 ml    ASSESSMENT AND PLAN:  Acute on chronic diastolic CHF:  Continue current diuretic.  Creat stable yesterday.  We will check it again tomorrow.    HYPERTENSION:  BP no longer low.  See below  Atrial fibrillation:  Recurrent.  Failed Flecainide and now propafenone.  I will continue amiodarone.   Warfarin per pharmacy.  INR is subtherapeutic slightly again today. Plan DCCV in 3 -4 weeks after she has had a therapeutic INR x 3 wks. Cardizem was increased yesterday and her rate is better controlled.  Acute on chronic dyspnea:  Cough improved.  On day  7/7 of atb.    Disposition:  PT working with her.  She will go to a nursing home at discharge.   I called today to update her daughter.      Fayrene Fearing [facility] 07/31/2012 10:39 AM

## 2012-07-31 NOTE — Progress Notes (Signed)
Patient states that she feels like her heart rate is "very high" and is having "palpitations."  She also states that she has felt this way at the same time of day for the last few days. Patient denies chest pain and SOB. Patient appears anxious with facial expressions, heart rate is 106 (apically auscultated), rhythm is irregular. The PA on call for [facility] was paged and notified of patient's status. No new orders given at this time. Will continue to monitor patient closely.

## 2012-08-01 LAB — BASIC METABOLIC PANEL
Calcium: 8.7 mg/dL (ref 8.4–10.5)
Creatinine, Ser: 1.12 mg/dL — ABNORMAL HIGH (ref 0.50–1.10)
GFR calc Af Amer: 49 mL/min — ABNORMAL LOW (ref >90)
Sodium: 144 mEq/L (ref 135–145)

## 2012-08-01 LAB — PROTIME-INR
INR: 2.14 — ABNORMAL HIGH (ref 0.00–1.49)
Prothrombin Time: 23 seconds — ABNORMAL HIGH (ref 11.6–15.2)

## 2012-08-01 MED ORDER — POTASSIUM CHLORIDE CRYS ER 20 MEQ PO TBCR
20.0000 meq | EXTENDED_RELEASE_TABLET | Freq: Every day | ORAL | Status: DC
  Administered 2012-08-01 – 2012-08-03 (×3): 20 meq via ORAL
  Filled 2012-08-01 (×3): qty 1

## 2012-08-01 MED ORDER — WARFARIN SODIUM 2 MG PO TABS
2.0000 mg | ORAL_TABLET | Freq: Once | ORAL | Status: AC
  Administered 2012-08-01: 2 mg via ORAL
  Filled 2012-08-01: qty 1

## 2012-08-01 NOTE — Progress Notes (Signed)
ANTICOAGULATION CONSULT NOTE - Follow Up Consult  Pharmacy Consult for coumadin Indication: atrial fibrillation  Allergies  Allergen Reactions  . Ciprofloxacin     unknown  . Quinolones     unknown  . Sulfonamide Derivatives     unknown  . Zolpidem Tartrate     Hallucinations    Patient Measurements: Height: 5\' 1"  (154.9 cm) Weight: 109 lb 6.4 oz (49.624 kg) (scale b) IBW/kg (Calculated) : 47.8   Vital Signs: Temp: 97.5 F (36.4 C) (03/08 0516) Temp src: Oral (03/08 0516) BP: 137/84 mmHg (03/08 0516) Pulse Rate: 124 (03/08 0516)  Labs:  Recent Labs  07/29/12 1218 07/30/12 0500 07/31/12 0500 08/01/12 0508  HGB  --   --  10.5*  --   HCT  --   --  32.3*  --   PLT  --   --  299  --   LABPROT  --  21.3* 21.7* 23.0*  INR  --  1.93* 1.98* 2.14*  CREATININE 1.11* 1.12*  --  1.12*    Estimated Creatinine Clearance: 26.2 ml/min (by C-G formula based on Cr of 1.12).   Medications:  Scheduled:  . amiodarone  400 mg Oral BID  . atorvastatin  10 mg Oral QODAY  . diltiazem  60 mg Oral Q8H  . furosemide  20 mg Oral Daily  . levothyroxine  25 mcg Oral QAC breakfast  . mometasone-formoterol  2 puff Inhalation BID  . nystatin  5 mL Oral QID  . piperacillin-tazobactam (ZOSYN)  IV  2.25 g Intravenous Q8H  . potassium chloride  40 mEq Oral Once  . senna-docusate  2 tablet Oral QHS  . sodium chloride  10-40 mL Intracatheter Q12H  . sodium chloride  3 mL Intravenous Q12H  . temazepam  15 mg Oral QHS  . temazepam  15 mg Oral Once  . tiotropium  18 mcg Inhalation Daily  . [COMPLETED] warfarin  4 mg Oral ONCE-1800  . Warfarin - Pharmacist Dosing Inpatient   Does not apply q1800    Assessment: 79 y.o female on coumadin for afib/flutter with planned cardioversion after INR therapeutic for 3 weeks. Admssion INR was 3.84 on home Coumadin regimen of 4 mg MWFSun and 2 mg TTS.  Coumadin held 3/1-3/3 and resumed on 3/4. INR today therapeutic at 2.14. DDI: Amiodarone begun 3/1  will increase INR.  Goal of Therapy:  INR 2-3 ([facility] tries to keep her INR 2.5-3.0) Monitor platelets by anticoagulation protocol: Yes   Plan:  1. Home dose of Coumadin, 2 mg PO today 2. F/u INR in AM 3. Monitor s/sx of bleeding   Bola A. Wandra Feinstein D Clinical Pharmacist [phone] Phone [phone] 08/01/2012 9:13 AM

## 2012-08-01 NOTE — Progress Notes (Signed)
   SUBJECTIVE:  Cough is better. No chest pain.  Telemetry shows atrial fib with controlled VR.   PHYSICAL EXAM Filed Vitals:   07/31/12 1448 07/31/12 2121 08/01/12 0516 08/01/12 0844  BP: 129/72 122/98 137/84   Pulse: 85 107 124   Temp: 97.5 F (36.4 C) 97.5 F (36.4 C) 97.5 F (36.4 C)   TempSrc: Oral Oral Oral   Resp: 18 18 18    Height:      Weight:   109 lb 6.4 oz (49.624 kg)   SpO2: 96% 95% 95% 98%   General:  Frail.  No acute distress Lungs:  No wheezing Heart:  Irregular Abdomen:  Positive bowel sounds, no rebound no guarding Extremities:  Trace edema. Neuro:  Nonfocal   LABS:  Results for orders placed during the hospital encounter of 07/25/12 (from the past 24 hour(s))  PROTIME-INR     Status: Abnormal   Collection Time    08/01/12  5:08 AM      Result Value Range   Prothrombin Time 23.0 (*) 11.6 - 15.2 seconds   INR 2.14 (*) 0.00 - 1.49  BASIC METABOLIC PANEL     Status: Abnormal   Collection Time    08/01/12  5:08 AM      Result Value Range   Sodium 144  135 - 145 mEq/L   Potassium 3.4 (*) 3.5 - 5.1 mEq/L   Chloride 106  96 - 112 mEq/L   CO2 31  19 - 32 mEq/L   Glucose, Bld 94  70 - 99 mg/dL   BUN 14  6 - 23 mg/dL   Creatinine, Ser 1.61 (*) 0.50 - 1.10 mg/dL   Calcium 8.7  8.4 - 09.6 mg/dL   GFR calc non Af Amer 43 (*) >90 mL/min   GFR calc Af Amer 49 (*) >90 mL/min    Intake/Output Summary (Last 24 hours) at 08/01/12 1311 Last data filed at 08/01/12 1258  Gross per 24 hour  Intake    600 ml  Output   1252 ml  Net   -652 ml    ASSESSMENT AND PLAN:  Acute on chronic diastolic CHF:  Continue current diuretic.  Creat stable.  Potassium is low. Will replete.   HYPERTENSION:  BP no longer low.  See below  Atrial fibrillation:  Recurrent.  Failed Flecainide and now propafenone.  To  continue amiodarone.   Warfarin per pharmacy. INR therapeutic today. Plan DCCV in 3 -4 weeks after she has had a therapeutic INR x 3 wks. Cardizem was increased  and  her rate is better controlled.  Acute on chronic dyspnea:  Cough improved.  On day 7/7 of atb.    Disposition:  PT working with her.  She will go to a nursing home at discharge.  Family from [place] visiting today.     Cassell Clement 08/01/2012 1:11 PM

## 2012-08-02 ENCOUNTER — Other Ambulatory Visit: Payer: Self-pay

## 2012-08-02 LAB — PROTIME-INR
INR: 2.41 — ABNORMAL HIGH (ref 0.00–1.49)
Prothrombin Time: 25.1 seconds — ABNORMAL HIGH (ref 11.6–15.2)

## 2012-08-02 MED ORDER — DILTIAZEM HCL ER COATED BEADS 180 MG PO CP24
180.0000 mg | ORAL_CAPSULE | Freq: Every day | ORAL | Status: DC
  Administered 2012-08-02: 180 mg via ORAL
  Filled 2012-08-02 (×2): qty 1

## 2012-08-02 MED ORDER — WARFARIN SODIUM 3 MG PO TABS
3.0000 mg | ORAL_TABLET | Freq: Once | ORAL | Status: AC
  Administered 2012-08-02: 3 mg via ORAL
  Filled 2012-08-02: qty 1

## 2012-08-02 NOTE — Progress Notes (Signed)
   SUBJECTIVE:  Cough is better. No chest pain. Strength improving Telemetry shows atrial fib with normal to mildly elevated VR.   PHYSICAL EXAM Filed Vitals:   08/01/12 2101 08/02/12 0550 08/02/12 0913 08/02/12 1024  BP: 137/70 123/83  125/58  Pulse: 102 115  93  Temp: 97.4 F (36.3 C) 97.8 F (36.6 C)    TempSrc: Oral Oral    Resp: 18 18    Height:      Weight:  112 lb 4.8 oz (50.939 kg)    SpO2: 98% 97% 95%    General:  Frail.  No acute distress Neck: supple Lungs:  Mild Rhonchi Heart:  Irregular Abdomen:  Positive bowel sounds, no rebound no guarding Extremities:  No edema. Neuro:  Nonfocal   LABS:  Results for orders placed during the hospital encounter of 07/25/12 (from the past 24 hour(s))  PROTIME-INR     Status: Abnormal   Collection Time    08/02/12  5:56 AM      Result Value Range   Prothrombin Time 25.1 (*) 11.6 - 15.2 seconds   INR 2.41 (*) 0.00 - 1.49    Intake/Output Summary (Last 24 hours) at 08/02/12 1125 Last data filed at 08/02/12 1036  Gross per 24 hour  Intake   1070 ml  Output    951 ml  Net    119 ml    ASSESSMENT AND PLAN:  Acute on chronic diastolic CHF:  Continue current diuretic.  Repeat BMET in AM.  HYPERTENSION:  BP controlled  Atrial fibrillation:  Recurrent.  Failed Flecainide and propafenone.  Continue amiodarone.   Warfarin per pharmacy. INR therapeutic today. Plan DCCV in 3 -4 weeks after she has had a therapeutic INR x 3 wks. Cardizem was increased  and her rate is better controlled. Change to CD  Acute on chronic dyspnea:  Cough improved.  Complete course of antibiotics.   Disposition:  PT working with her.  She will go to a nursing home at discharge.       Olga Millers 08/02/2012 11:25 AM

## 2012-08-02 NOTE — Progress Notes (Signed)
ANTICOAGULATION CONSULT NOTE - Follow Up Consult  Pharmacy Consult for coumadin Indication: atrial fibrillation  Allergies  Allergen Reactions  . Ciprofloxacin     unknown  . Quinolones     unknown  . Sulfonamide Derivatives     unknown  . Zolpidem Tartrate     Hallucinations    Patient Measurements: Height: 5\' 1"  (154.9 cm) Weight: 112 lb 4.8 oz (50.939 kg) (scale b) IBW/kg (Calculated) : 47.8   Vital Signs: Temp: 97.8 F (36.6 C) (03/09 0550) Temp src: Oral (03/09 0550) BP: 125/58 mmHg (03/09 1024) Pulse Rate: 93 (03/09 1024)  Labs:  Recent Labs  07/31/12 0500 08/01/12 0508 08/02/12 0556  HGB 10.5*  --   --   HCT 32.3*  --   --   PLT 299  --   --   LABPROT 21.7* 23.0* 25.1*  INR 1.98* 2.14* 2.41*  CREATININE  --  1.12*  --     Estimated Creatinine Clearance: 26.2 ml/min (by C-G formula based on Cr of 1.12).   Medications:  Scheduled:  . amiodarone  400 mg Oral BID  . atorvastatin  10 mg Oral QODAY  . diltiazem  60 mg Oral Q8H  . furosemide  20 mg Oral Daily  . levothyroxine  25 mcg Oral QAC breakfast  . mometasone-formoterol  2 puff Inhalation BID  . nystatin  5 mL Oral QID  . piperacillin-tazobactam (ZOSYN)  IV  2.25 g Intravenous Q8H  . potassium chloride  20 mEq Oral Daily  . senna-docusate  2 tablet Oral QHS  . sodium chloride  10-40 mL Intracatheter Q12H  . sodium chloride  3 mL Intravenous Q12H  . temazepam  15 mg Oral QHS  . temazepam  15 mg Oral Once  . tiotropium  18 mcg Inhalation Daily  . [COMPLETED] warfarin  2 mg Oral ONCE-1800  . Warfarin - Pharmacist Dosing Inpatient   Does not apply q1800  . [DISCONTINUED] potassium chloride  40 mEq Oral Once    Assessment: 79 y.o female on coumadin for afib/flutter with planned cardioversion after INR therapeutic for 3 weeks. Admssion INR was 3.84 on home Coumadin regimen of 4 mg MWFSun and 2 mg TTS.  Coumadin held 3/1-3/3 and resumed on 3/4. INR today therapeutic at 2.41. DDI: Amiodarone  begun 3/1 will increase INR.  Goal of Therapy:  INR 2-3 ([facility] tries to keep her INR 2.5-3.0) Monitor platelets by anticoagulation protocol: Yes   Plan:  1. Coumadin, 3 mg PO today 2. F/u INR in AM 3. Monitor s/sx of bleeding   Julie Rich. Darin Engels.D. Clinical Pharmacist Pager [phone] Phone [phone] 08/02/2012 11:16 AM

## 2012-08-03 ENCOUNTER — Telehealth: Payer: Self-pay | Admitting: Cardiology

## 2012-08-03 DIAGNOSIS — I5033 Acute on chronic diastolic (congestive) heart failure: Secondary | ICD-10-CM | POA: Diagnosis present

## 2012-08-03 DIAGNOSIS — R5381 Other malaise: Secondary | ICD-10-CM | POA: Diagnosis present

## 2012-08-03 DIAGNOSIS — Z7901 Long term (current) use of anticoagulants: Secondary | ICD-10-CM

## 2012-08-03 LAB — BASIC METABOLIC PANEL
BUN: 12 mg/dL (ref 6–23)
CO2: 29 mEq/L (ref 19–32)
Calcium: 8.3 mg/dL — ABNORMAL LOW (ref 8.4–10.5)
Creatinine, Ser: 1.04 mg/dL (ref 0.50–1.10)
Glucose, Bld: 91 mg/dL (ref 70–99)

## 2012-08-03 LAB — PROTIME-INR
INR: 2.55 — ABNORMAL HIGH (ref 0.00–1.49)
Prothrombin Time: 26.2 s — ABNORMAL HIGH (ref 11.6–15.2)

## 2012-08-03 MED ORDER — DILTIAZEM HCL ER COATED BEADS 240 MG PO CP24: 240.0000 mg | ORAL_CAPSULE | Freq: Every day | ORAL | Status: DC

## 2012-08-03 MED ORDER — GUAIFENESIN 100 MG/5ML PO SOLN: 5.0000 mL | ORAL | Status: DC | PRN

## 2012-08-03 MED ORDER — TEMAZEPAM 15 MG PO CAPS: 15.0000 mg | ORAL_CAPSULE | Freq: Every day | ORAL | Status: DC

## 2012-08-03 MED ORDER — ALPRAZOLAM 0.25 MG PO TABS: 0.2500 mg | ORAL_TABLET | Freq: Every day | ORAL | Status: DC | PRN

## 2012-08-03 MED ORDER — DILTIAZEM HCL ER COATED BEADS 240 MG PO CP24
240.0000 mg | ORAL_CAPSULE | Freq: Every day | ORAL | Status: DC
  Administered 2012-08-03: 240 mg via ORAL
  Filled 2012-08-03 (×2): qty 1

## 2012-08-03 MED ORDER — AMIODARONE HCL 400 MG PO TABS: 400.0000 mg | ORAL_TABLET | Freq: Two times a day (BID) | ORAL | Status: DC

## 2012-08-03 MED ORDER — POTASSIUM CHLORIDE CRYS ER 20 MEQ PO TBCR: 20.0000 meq | EXTENDED_RELEASE_TABLET | Freq: Every day | ORAL | Status: DC

## 2012-08-03 MED ORDER — WARFARIN SODIUM 4 MG PO TABS
4.0000 mg | ORAL_TABLET | Freq: Once | ORAL | Status: AC
  Administered 2012-08-03: 4 mg via ORAL
  Filled 2012-08-03: qty 1

## 2012-08-03 MED ORDER — WARFARIN SODIUM 2 MG PO TABS: 2.0000 mg | ORAL_TABLET | Freq: Every day | ORAL | Status: DC

## 2012-08-03 MED ORDER — POTASSIUM CHLORIDE CRYS ER 20 MEQ PO TBCR
40.0000 meq | EXTENDED_RELEASE_TABLET | Freq: Once | ORAL | Status: AC
  Administered 2012-08-03: 40 meq via ORAL
  Filled 2012-08-03: qty 2

## 2012-08-03 MED ORDER — FUROSEMIDE 20 MG PO TABS: 20.0000 mg | ORAL_TABLET | Freq: Every day | ORAL | Status: DC

## 2012-08-03 NOTE — Progress Notes (Signed)
Physical Therapy Treatment Patient Details Name: Julie Rich MRN: [id number] DOB: 1921-11-12 Today's Date: 08/03/2012 Time: 8657-8469 PT Time Calculation (min): 24 min  PT Assessment / Plan / Recommendation Comments on Treatment Session  Patient continues with severe limitations in activity tolerance.  Still able to walk further today, but seems more fatigued and did not feel she could do any exercise after walking today, nor put forth the effort to scoot up in bed.  Agree wtih SNF level therapies at discharge.    Follow Up Recommendations  SNF     Does the patient have the potential to tolerate intense rehabilitation   N/A  Barriers to Discharge  None      Equipment Recommendations  Rolling walker with 5" wheels    Recommendations for Other Services  None  Frequency Min 3X/week   Plan Discharge plan remains appropriate    Precautions / Restrictions Precautions Precautions: Fall   Pertinent Vitals/Pain Denies pain; HR 102, SpO2 94% on room air with mobility    Mobility  Bed Mobility Supine to Sit: HOB elevated;5: Supervision Sit to Supine: HOB flat;5: Supervision Details for Bed Mobility Assistance: increased time and cues required for proper placement Transfers Sit to Stand: 4: Min assist;With upper extremity assist;From bed;From toilet;6: Modified independent (Device/Increase time) Stand to Sit: To bed;4: Min assist;To toilet Details for Transfer Assistance: min assist for safety from bed, pt toileting in bathroom and when she called me once finished she was standing up in the bathroom  Ambulation/Gait Ambulation/Gait Assistance: 4: Min guard Ambulation Distance (Feet): 150 Feet Assistive device: Rolling walker Ambulation/Gait Assistance Details: no loss of balance, but patient severely fatigued with activity Gait Pattern: Decreased stride length;Step-through pattern      PT Goals Acute Rehab PT Goals Pt will go Sit to Stand: with modified independence PT Goal:  Sit to Stand - Progress: Progressing toward goal Pt will go Stand to Sit: with modified independence PT Goal: Stand to Sit - Progress: Progressing toward goal Pt will Transfer Bed to Chair/Chair to Bed: with modified independence PT Transfer Goal: Bed to Chair/Chair to Bed - Progress: Goal set today PT Goal: Ambulate - Progress: Progressing toward goal  Visit Information  Last PT Received On: 08/03/12    Subjective Data  Subjective: I don't think I did as well as yesterday.   Cognition  Cognition Overall Cognitive Status: Appears within functional limits for tasks assessed/performed Arousal/Alertness: Awake/alert Orientation Level: Appears intact for tasks assessed Behavior During Session: [facility] for tasks performed       End of Session PT - End of Session Equipment Utilized During Treatment: Gait belt Activity Tolerance: Patient limited by fatigue Patient left: in bed;with call bell/phone within reach   GP     [facility] 08/03/2012, 12:57 PM [place], [place] [phone] 08/03/2012

## 2012-08-03 NOTE — H&P (Signed)
Blane Ohara to be D/C'd Skilled nursing facility per MD order.  Discussed with the patient and all questions fully answered.    Medication List    STOP taking these medications       albuterol 108 (90 BASE) MCG/ACT inhaler  Commonly known as:  PROVENTIL HFA;VENTOLIN HFA     diltiazem 30 MG tablet  Commonly known as:  CARDIZEM     metoprolol succinate 100 MG 24 hr tablet  Commonly known as:  TOPROL-XL     propafenone 150 MG tablet  Commonly known as:  RYTHMOL      TAKE these medications       ADVAIR DISKUS 250-50 MCG/DOSE Aepb  Generic drug:  Fluticasone-Salmeterol  Inhale 1 puff into the lungs 2 (two) times daily.     ALPRAZolam 0.25 MG tablet  Commonly known as:  XANAX  Take 1 tablet (0.25 mg total) by mouth daily as needed for anxiety. For anxiety     amiodarone 400 MG tablet  Commonly known as:  PACERONE  Take 1 tablet (400 mg total) by mouth 2 (two) times daily.     atorvastatin 20 MG tablet  Commonly known as:  LIPITOR  Take 10 mg by mouth every other day.     diltiazem 240 MG 24 hr capsule  Commonly known as:  CARDIZEM CD  Take 1 capsule (240 mg total) by mouth daily.     furosemide 20 MG tablet  Commonly known as:  LASIX  Take 1 tablet (20 mg total) by mouth daily.     guaiFENesin 100 MG/5ML Soln  Commonly known as:  [name]  Take 5 mLs (100 mg total) by mouth every 4 (four) hours as needed.     levothyroxine 25 MCG tablet  Commonly known as:  SYNTHROID, LEVOTHROID  Take 25 mcg by mouth daily.     potassium chloride SA 20 MEQ tablet  Commonly known as:  K-DUR,KLOR-CON  Take 1 tablet (20 mEq total) by mouth daily.     senna-docusate 8.6-50 MG per tablet  Commonly known as:  Senokot-S  Take 2 tablets by mouth at bedtime.     temazepam 15 MG capsule  Commonly known as:  RESTORIL  Take 1 capsule (15 mg total) by mouth at bedtime.     tiotropium 18 MCG inhalation capsule  Commonly known as:  SPIRIVA  Place 18 mcg into inhaler and inhale daily.      warfarin 2 MG tablet  Commonly known as:  COUMADIN  Take 1 tablet (2 mg total) by mouth daily. As directed. For now, take 2 mg daily, recheck 3/13.        VVS, Skin clean, dry and intact without evidence of skin break down, no evidence of skin tears noted. IV PICC line discontinued intact. Site without signs and symptoms of complications. Dressing and pressure applied.   Patient escorted via stretcher, and D/C SNF via private ambulance.  HANEY, RASHIDA 08/03/2012 8:31 PM

## 2012-08-03 NOTE — Telephone Encounter (Signed)
Per Dr Ladona Ridgel - the EP specialist - pt failed Flecainide.  Left message for [name] of this information and asked her to call back with further questions

## 2012-08-03 NOTE — Progress Notes (Signed)
   SUBJECTIVE:  Weak.  No acute complaints.    PHYSICAL EXAM Filed Vitals:   08/02/12 1443 08/02/12 2041 08/02/12 2051 08/03/12 0528  BP: 127/61  156/69 145/71  Pulse:   95 105  Temp:   97.5 F (36.4 C) 97.7 F (36.5 C)  TempSrc:   Oral Oral  Resp:   20 19  Height:      Weight:    111 lb 5.3 oz (50.5 kg)  SpO2:  96% 96% 95%   General:  Frail.  No acute distress Lungs:  No wheezing Heart:  Irregular Abdomen:  Positive bowel sounds, no rebound no guarding Extremities:  Trace ankle edema Neuro:  Nonfocal   LABS:  Results for orders placed during the hospital encounter of 07/25/12 (from the past 24 hour(s))  PROTIME-INR     Status: Abnormal   Collection Time    08/03/12  3:54 AM      Result Value Range   Prothrombin Time 26.2 (*) 11.6 - 15.2 seconds   INR 2.55 (*) 0.00 - 1.49  BASIC METABOLIC PANEL     Status: Abnormal   Collection Time    08/03/12  3:54 AM      Result Value Range   Sodium 139  135 - 145 mEq/L   Potassium 3.2 (*) 3.5 - 5.1 mEq/L   Chloride 104  96 - 112 mEq/L   CO2 29  19 - 32 mEq/L   Glucose, Bld 91  70 - 99 mg/dL   BUN 12  6 - 23 mg/dL   Creatinine, Ser 9.62  0.50 - 1.10 mg/dL   Calcium 8.3 (*) 8.4 - 10.5 mg/dL   GFR calc non Af Amer 47 (*) >90 mL/min   GFR calc Af Amer 54 (*) >90 mL/min    Intake/Output Summary (Last 24 hours) at 08/03/12 9528 Last data filed at 08/03/12 0500  Gross per 24 hour  Intake    720 ml  Output    300 ml  Net    420 ml    ASSESSMENT AND PLAN:  Acute on chronic diastolic CHF:  Continue current diuretic.  Creat stable yesterday.  I gave an additional 40 meq of Kdur this am.    HYPERTENSION:  BP controlled.   Atrial fibrillation:  Recurrent.  Failed Flecainide and now propafenone.  Continuing amiodarone.   Warfarin per pharmacy.  INR is therapeutic. Plan DCCV in 3 -4 weeks after she has had a therapeutic INR x 3 wks. Cardizem converted to CD  Acute on chronic dyspnea:  Cough improved. Completed a course of  antibiotics.   Disposition:  Nursing home today hopefully.      Fayrene [facility] 08/03/2012 7:42 AM

## 2012-08-03 NOTE — Telephone Encounter (Signed)
Dr. Antoine Poche put in records from hospital that pt failed flecainide testing and daughter doesn't remember pt ever being on it and she wants you to check records and make sure it is correct

## 2012-08-03 NOTE — Progress Notes (Signed)
Bed offers given to patient's daughter [name] and she has chosen [facility].  Per MD- patient is medically stable for d/c. Awaiting completion of DC summary by Theodore Demark, PA.  Facility "cut off" time to receive d/c summary is 4 pm.  Attempting to reach [place] to discuss.  Lorri Frederick. [place] P[place]  [phone]

## 2012-08-03 NOTE — Progress Notes (Signed)
ANTICOAGULATION CONSULT NOTE - Follow Up Consult  Pharmacy Consult for Coumadin Indication: atrial fibrillation  Allergies  Allergen Reactions  . Ciprofloxacin     unknown  . Quinolones     unknown  . Sulfonamide Derivatives     unknown  . Zolpidem Tartrate     Hallucinations    Patient Measurements: Height: 5\' 1"  (154.9 cm) Weight: 111 lb 5.3 oz (50.5 kg) ( B Scale) IBW/kg (Calculated) : 47.8  Vital Signs: Temp: 97.7 F (36.5 C) (03/10 0528) Temp src: Oral (03/10 0528) BP: 145/71 mmHg (03/10 0528) Pulse Rate: 105 (03/10 0528)  Labs:  Recent Labs  08/01/12 0508 08/02/12 0556 08/03/12 0354  LABPROT 23.0* 25.1* 26.2*  INR 2.14* 2.41* 2.55*  CREATININE 1.12*  --  1.04    Estimated Creatinine Clearance: 28.2 ml/min (by C-G formula based on Cr of 1.04).   Medications:  Scheduled:  . amiodarone  400 mg Oral BID  . atorvastatin  10 mg Oral QODAY  . diltiazem  240 mg Oral Daily  . furosemide  20 mg Oral Daily  . levothyroxine  25 mcg Oral QAC breakfast  . mometasone-formoterol  2 puff Inhalation BID  . nystatin  5 mL Oral QID  . piperacillin-tazobactam (ZOSYN)  IV  2.25 g Intravenous Q8H  . potassium chloride  20 mEq Oral Daily  . [COMPLETED] potassium chloride  40 mEq Oral Once  . senna-docusate  2 tablet Oral QHS  . sodium chloride  10-40 mL Intracatheter Q12H  . sodium chloride  3 mL Intravenous Q12H  . temazepam  15 mg Oral QHS  . temazepam  15 mg Oral Once  . tiotropium  18 mcg Inhalation Daily  . [COMPLETED] warfarin  3 mg Oral ONCE-1800  . Warfarin - Pharmacist Dosing Inpatient   Does not apply q1800  . [DISCONTINUED] diltiazem  180 mg Oral Daily    Assessment: 80 yo F continues on Coumadin for afib/flutter with plans for DCCV in next 3-4 weeks.  Pt has failed other antiarrhythmic agents and was started on amiodarone this admission.  Amiodarone can increase the INR over time and it is likely that the patient's Coumadin dose will need to be reduced  accordingly.  For now, the INR is therapeutic.   Goal of Therapy:  INR 2.5-3 (per daughter and [facility] monitoring)   Plan:  Coumadin 4 mg PO x 1 tonight. Follow-up daily INR.  [employer], Pharm.D., BCPS Clinical Pharmacist Pager [phone] 08/03/2012 11:49 AM

## 2012-08-03 NOTE — Clinical Social Work Placement (Addendum)
    Clinical Social Work Department CLINICAL SOCIAL WORK PLACEMENT NOTE 08/03/2012  Patient:  Julie Rich, Julie Rich  Account Number:  [id number] Admit date:  07/25/2012  Clinical Social Worker:  Lupita Leash CROWDER, LCSWA  Date/time:  07/31/2012 12:00 N  Clinical Social Work is seeking post-discharge placement for this patient at the following level of care:   SKILLED NURSING   (*CSW will update this form in Epic as items are completed)   07/31/2012  Patient/family provided with [facility] Department of Clinical Social Work's list of facilities offering this level of care within the geographic area requested by the patient (or if unable, by the patient's family).  07/31/2012  Patient/family informed of their freedom to choose among providers that offer the needed level of care, that participate in Medicare, Medicaid or managed care program needed by the patient, have an available bed and are willing to accept the patient.  07/31/2012  Patient/family informed of [facility]' ownership interest in [facility], as well as of the fact that they are under no obligation to receive care at this facility.  PASARR submitted to EDS on 07/31/2012 PASARR number received from EDS on 07/31/2012  FL2 transmitted to all facilities in geographic area requested by pt/family on  07/31/2012 FL2 transmitted to all facilities within larger geographic area on   Patient informed that his/her managed care company has contracts with or will negotiate with  certain facilities, including the following:     Patient/family informed of bed offers received: 08/03/12   Patient chooses bed at [facility] Physician recommends and patient chooses bed at    Patient to be transferred to  [facility] on  08/03/12 Patient to be transferred to facility by Ambulance [facility])  The following physician request were entered in Epic:   Additional Comments: Patient and daughter are pleased with d/c choice; notified SNF and pt's  nurse of d/c.  No further CSW needs identified.  CSW signing off.  Lorri Frederick. [place]  [phone]

## 2012-08-03 NOTE — Discharge Summary (Signed)
CARDIOLOGY DISCHARGE SUMMARY   Patient ID: Julie Rich MRN: [id number] DOB/AGE: 08-31-1921 79 y.o.  Admit date: 07/25/2012 Discharge date: 08/06/2012  Primary Discharge Diagnosis:  Acute on chronic diastolic CHF (congestive heart failure), NYHA class 4 Secondary Discharge Diagnosis: :   HYPERLIPIDEMIA   HYPERTENSION   Atrial fibrillation   Chest pain   COPD exacerbation   Hypokalemia    Deconditioning    Chronic anticoagulation  Consults: Pulmonary/critical care medicine  Procedures: PICC line  Hospital Course: Julie Rich is a 79 y.o. female with no history of CAD. She has a history of atrial fibrillation and CHF. She had been in atrial fibrillation and came to the hospital for a TEE cardioversion on 07/24/2012. She tolerated the procedure well and was discharged home. The next morning, she woke with shortness of breath, PND and orthopnea. She came to the hospital where she was admitted for further evaluation and treatment.    Acute on chronic diastolic CHF (congestive heart failure), NYHA class 4 - she was given IV Lasix for diuresis. When she diuresed, respiratory status improved some and she was restarted on oral Lasix. She continues on that and will be discharged on it. Her potassium and renal function were monitored closely with diuresis and her potassium was supplemented as needed.    Hypokalemia - secondary to diuresis and supplemented when necessary.    HYPERLIPIDEMIA - she had been on a statin prior to admission and this was continued. She is to follow up with her primary cardiologist for this.    Atrial fibrillation - the patient went back into atrial fibrillation with rapid ventricular response on the hospital. When she was in atrial fibrillation, she had significant T-wave depression in the lateral. Cardizem IV was used for rate control as well as IV Lopressor. She required a PICC line for the IV medications. She was started on IV amiodarone. The IV amiodarone was  converted to oral once her condition stabilized. She had previously failed flecainide. Currently had been on propafenone but has now failed that. She will be continued on amiodarone with plans for cardioversion in a few weeks. She was continued on Cardizem which was converted to oral medications. She is to continue this as well.    HYPERTENSION - She tolerated the addition of Cardizem to her medication regimen and her blood pressure was well-controlled.    Chest pain - She had some chest pain with rapid atrial fibrillation. Her cardiac enzymes were cycled and were negative. Her chest pain resolved once her heart rate was controlled. No further workup is indicated at this time.    COPD exacerbation - the patient developed cough and nose concern for bronchitis. She also has history COPD. A pulmonary consult was called. She was seen by Dr. Vassie Loll. He recommended stopping the Xopenex because of her atrial fibrillation and rapid ventricular response. She was on Atrovent while in the hospital but he fell she could switch back to inhalers at discharge. She is to follow up with Dr. Sherene Sires as an outpatient. She had been started on antibiotics and was continued on these but these will be stopped at discharge.     Deconditioning - the patient had problems with deconditioning and poor exercise tolerance. She was seen by physical therapy and ambulated with them once her heart rate was better controlled.. Skilled nursing facility placement was recommended. This was pursued with the help of clinical social work. She has accepted a bed at [facility] and will be discharged there.  Chronic anticoagulation - Julie Rich had been on Coumadin prior to admission. This was continued and her INR was therapeutic. This is to be monitored closely as an outpatient.  08/03/2012, Julie Rich heart rate had stabilized. She was tolerating the medication changes. Her potassium required supplementation and this was done. She was evaluated by  Dr. Antoine Poche and considered stable for discharge to a skilled nursing facility for continued rehabilitation.  Labs:  Lab Results  Component Value Date   WBC 5.5 07/31/2012   HGB 10.5* 07/31/2012   HCT 32.3* 07/31/2012   MCV 93.6 07/31/2012   PLT 299 07/31/2012     Recent Labs Lab 08/03/12 0354  NA 139  K 3.2*  CL 104  CO2 29  BUN 12  CREATININE 1.04  CALCIUM 8.3*  GLUCOSE 91   Pro B Natriuretic peptide (BNP)  Date/Time Value Range Status  07/25/2012 12:22 PM 3522.0* 0 - 450 pg/mL Final  07/25/2012  5:02 AM 3124.0* 0 - 450 pg/mL Final   No results found for this basename: INR,  in the last 72 hours Lab Results  Component Value Date   CKTOTAL 41 07/04/2011   CKMB 2.5 07/04/2011   TROPONINI <0.30 07/26/2012      Radiology: X-ray Chest Pa And Lateral  07/27/2012  *RADIOLOGY REPORT*  Clinical Data: Follow-up congestive heart failure.  Possible pneumonia.  CHEST - 2 VIEW  Comparison: One-view chest 07/25/2012  Findings: The heart is mildly enlarged.  The bilateral pleural effusions persist, worse on the left.  Mild bibasilar atelectasis is present.  Basilar airspace disease is improved.  The overall lung volumes have improved.  A right-sided PICC line is stable.  IMPRESSION:  1.  Improved aeration of both lung bases with mild persistent atelectasis, worse on the left. 2.  Persistent small bilateral pleural effusions, worse on the left.   Original Report Authenticated By: Marin Roberts, M.D.    Dg Chest Portable 1 View 07/25/2012  *RADIOLOGY REPORT*  Clinical Data: Shortness of breath  PORTABLE CHEST - 1 VIEW  Comparison: 07/01/2012  Findings: Small bilateral pleural effusions with associated airspace opacities.  Heart size upper normal. Central vascular congestion and mild perihilar/right greater than left infrahilar opacity.  Aortic tortuosity.  Interstitial coarsening and emphysema.  Diffuse osteopenia.  IMPRESSION: Small bilateral pleural effusions with associated airspace opacity; atelectasis  versus infiltrate.  Cardiomegaly with central vascular congestion and mild edema pattern.  Emphysema.   Original Report Authenticated By: Jearld Lesch, M.D.    EKG: 03-Aug-2012 04:36:03  Atrial fibrillation with rapid ventricular response Septal infarct , age undetermined Abnormal ECG 32mm/s 3mm/mV 100Hz  8.0.1 12SL 239 CID: 1 Referred by: Unconfirmed Vent. rate 111 BPM PR interval * ms QRS duration 94 ms QT/QTc 350/476 ms P-R-T axes * 34 93  FOLLOW UP PLANS AND APPOINTMENTS Allergies  Allergen Reactions  . Ciprofloxacin     unknown  . Quinolones     unknown  . Sulfonamide Derivatives     unknown  . Zolpidem Tartrate     Hallucinations     Medication List    STOP taking these medications       albuterol 108 (90 BASE) MCG/ACT inhaler  Commonly known as:  PROVENTIL HFA;VENTOLIN HFA     diltiazem 30 MG tablet  Commonly known as:  CARDIZEM     metoprolol succinate 100 MG 24 hr tablet  Commonly known as:  TOPROL-XL     propafenone 150 MG tablet  Commonly known as:  RYTHMOL  TAKE these medications       ADVAIR DISKUS 250-50 MCG/DOSE Aepb  Generic drug:  Fluticasone-Salmeterol  Inhale 1 puff into the lungs 2 (two) times daily.     ALPRAZolam 0.25 MG tablet  Commonly known as:  XANAX  Take 1 tablet (0.25 mg total) by mouth daily as needed for anxiety. For anxiety     amiodarone 400 MG tablet  Commonly known as:  PACERONE  Take 1 tablet (400 mg total) by mouth 2 (two) times daily.     atorvastatin 20 MG tablet  Commonly known as:  LIPITOR  Take 10 mg by mouth every other day.     diltiazem 240 MG 24 hr capsule  Commonly known as:  CARDIZEM CD  Take 1 capsule (240 mg total) by mouth daily.     furosemide 20 MG tablet  Commonly known as:  LASIX  Take 1 tablet (20 mg total) by mouth daily.     guaiFENesin 100 MG/5ML Soln  Commonly known as:  [name]  Take 5 mLs (100 mg total) by mouth every 4 (four) hours as needed.     levothyroxine 25 MCG  tablet  Commonly known as:  SYNTHROID, LEVOTHROID  Take 25 mcg by mouth daily.     potassium chloride SA 20 MEQ tablet  Commonly known as:  K-DUR,KLOR-CON  Take 1 tablet (20 mEq total) by mouth daily.     senna-docusate 8.6-50 MG per tablet  Commonly known as:  Senokot-S  Take 2 tablets by mouth at bedtime.     temazepam 15 MG capsule  Commonly known as:  RESTORIL  Take 1 capsule (15 mg total) by mouth at bedtime.     tiotropium 18 MCG inhalation capsule  Commonly known as:  SPIRIVA  Place 18 mcg into inhaler and inhale daily.     warfarin 2 MG tablet  Commonly known as:  COUMADIN  Take 1 tablet (2 mg total) by mouth daily. As directed. For now, take 2 mg daily, recheck 3/13.        Discharge Orders   Future Appointments Provider Department Dept Phone   08/12/2012 1:30 PM Rollene Rotunda, MD [facility] [phone]   08/14/2012 12:15 PM Rollene Rotunda, MD [facility] Main Office [place]) [phone]   Future Orders Complete By Expires     (HEART FAILURE PATIENTS) Call MD:  Anytime you have any of the following symptoms: 1) 3 pound weight gain in 24 hours or 5 pounds in 1 week 2) shortness of breath, with or without a dry hacking cough 3) swelling in the hands, feet or stomach 4) if you have to sleep on extra pillows at night in order to breathe.  As directed     Diet - low sodium heart healthy  As directed     Discharge instructions  As directed     Comments:      INR and BMET on Thursday, 3/13. Results to Dr Antoine Poche at fax [phone]. Diet is 2 gm NA, fat modified.    Discharge instructions  As directed     Comments:      Weigh daily    Increase activity slowly  As directed       Follow-up Information   Follow up with [facility] Nurse, Physical Therapy, and Aide)    Contact information:   [phone]      Follow up with Rollene Rotunda, MD On 08/14/2012. (at 12:15 pm)    Contact information:  [address] [place] [address] [phone]       BRING ALL MEDICATIONS WITH YOU TO FOLLOW UP APPOINTMENTS  Time spent with patient to include physician time: 42 min Signed: Theodore Demark, PA-C 08/06/2012, 10:45 AM Co-Sign MD

## 2012-08-03 NOTE — H&P (Signed)
Report given to [facility] at [facility]. Ambulance has arrived and is transporting patient via stretcher in ambulance to the SNF. Nurse signing off at this time.

## 2012-08-03 NOTE — Clinical Social Work Psychosocial (Addendum)
    Clinical Social Work Department BRIEF PSYCHOSOCIAL ASSESSMENT 08/03/2012  Patient:  Julie Rich, Julie Rich     Account Number:  [id number]     Admit date:  07/25/2012  Clinical Social Worker:  Tiburcio Pea  Date/Time:  07/31/2012 11:30 AM  Referred by:  Physician  Date Referred:  07/31/2012 Referred for  SNF Placement   Other Referral:   Interview type:  Other - See comment Other interview type:   Patient and daughter  Julie Rich    PSYCHOSOCIAL DATA Living Status:  ALONE Admitted from facility:   Level of care:   Primary support name:  Julie Rich Primary support relationship to patient:  CHILD, ADULT Degree of support available:   Strong support    CURRENT CONCERNS Current Concerns  Post-Acute Placement   Other Concerns:    SOCIAL WORK ASSESSMENT / PLAN CSW referred for short term SNF;  she lives alone and PT is recommending short term SNF. She had been wanting to return home but is now convinced that her daughter Julie Rich cannot manage her care at home at this time. Discussed short term SNF process and she agreed but she requested that CSW discuss wtih her daughter Julie Rich. CSW spoke with daughter who agreess wtih short term SNF plan. Discussed bed search process and Fl2 will be placed on shadow chart for MD' signature.  Fl2 sent out to facilities for review- will ask weekend SW to provide bed offers to patient and his daughter.   Assessment/plan status:  Psychosocial Support/Ongoing Assessment of Needs Other assessment/ plan:   Information/referral to community resources:   SNF bed list left in patient's room for daughter to review.    PATIENT'S/FAMILY'S RESPONSE TO PLAN OF CARE: Patient is alert and oriented; very pleasant lady who requests that CSW speak to her daughter regarding bed decisions for SNF placement.  [facility] is agreeable to short term snf and was appreciative of CSW's visit and assistance. Patient's daughter was also very appreciative of CSW's inforamtion and  assistance with short term SNF.

## 2012-08-03 NOTE — Progress Notes (Signed)
Daughter has changed her mind and has now [employer] because they have TV's and phones in the room. She has discussed with her mother and she agreed with this choice.  Plan d/c to [facility] this afternoon- per nursing- patient has a PICC line in - she will call for this to be d/c'd.  DC will be delayed until PICC line removed- then the charge nurse- Jacki Cones will call EMS.  Message left for patient's daughter regarding above.  CSW discussed with patient.  Lorri Frederick. [place]  [phone]  '

## 2012-08-12 ENCOUNTER — Ambulatory Visit: Payer: Medicare Other | Admitting: Cardiology

## 2012-08-14 ENCOUNTER — Telehealth: Payer: Self-pay | Admitting: Internal Medicine

## 2012-08-14 ENCOUNTER — Ambulatory Visit (INDEPENDENT_AMBULATORY_CARE_PROVIDER_SITE_OTHER): Payer: Medicare Other | Admitting: Cardiology

## 2012-08-14 ENCOUNTER — Encounter: Payer: Self-pay | Admitting: Cardiology

## 2012-08-14 VITALS — BP 130/76 | HR 85 | Ht 61.0 in | Wt 116.0 lb

## 2012-08-14 DIAGNOSIS — I4891 Unspecified atrial fibrillation: Secondary | ICD-10-CM

## 2012-08-14 DIAGNOSIS — I10 Essential (primary) hypertension: Secondary | ICD-10-CM

## 2012-08-14 MED ORDER — AMIODARONE HCL 200 MG PO TABS: 200.0000 mg | ORAL_TABLET | Freq: Two times a day (BID) | ORAL | Status: DC

## 2012-08-14 NOTE — Telephone Encounter (Signed)
I will be in [place] first Tuesday in [place] to be on amiodarone unless breathing worsens in which case we need to see her in [place] right away

## 2012-08-14 NOTE — Telephone Encounter (Signed)
LMTCB

## 2012-08-14 NOTE — Patient Instructions (Addendum)
Please decrease your Amiodarone to 200 mg one twice a day. Continue all other medications as listed.  Please see Dr Ladona Ridgel for further evalation of At Fib.  (August 18, 2012 at 12:30)  Follow up with Dr Antoine Poche will be determined by Dr Ladona Ridgel.

## 2012-08-14 NOTE — Telephone Encounter (Signed)
Called, spoke with pt's daughter, Julie Rich.   She has concerns about pt being on amiodarone and it affecting pt's breathing. She would like to know how MW feels about pt being on this medication -- ? Is she shouldn't be on it because of her breathing problems, or if it is ok with her being on with her breathing problems.   Dr. Sherene Sires, pls advise.  Thank you,  Also, pt was last seen on Jul 16, 2012. She was asked to come back in 3 months which will be in May.  Daughter would like to schedule this in [place].  Dr. Sherene Sires, do you know if you will be going to [facility] in May?  Pls advise.  Thank you.

## 2012-08-14 NOTE — Progress Notes (Signed)
HPI She has a history of diastolic CHF and atrial fibrillation.  She is on Coumadin. She was previously on flecainide and Rythmol.  However, she's had recurrent fibrillation on Rythmol.  She was admitted to the hospital with again exacerbation of her lung disease with recurrent atrial fibrillation post cardioversion. This was after a TEE guided cardioversion. Dr. Ladona Ridgel admitted her and stopped the Rythmol and started amiodarone.  She was discharged to a nursing home in atrial fibrillation to continue an amiodarone load and to continue anticoagulation.  She returns today for followup. She's having her Coumadin followed closely. It now looks like it has been above and INR 2 since March 8. However, she has developed a tremor and she and her daughter are both worried about continuing the amiodarone. They very much want to switch back to the Rythmol. Her heart rates relatively well controlled. Her breathing has been better. She's working with physical therapy though very weak. She was able to make a Jello salad today.    Allergies  Allergen Reactions  . Ciprofloxacin     unknown  . Quinolones     unknown  . Sulfonamide Derivatives     unknown  . Zolpidem Tartrate     Hallucinations    Current Outpatient Prescriptions  Medication Sig Dispense Refill  . albuterol (PROVENTIL) (2.5 MG/3ML) 0.083% nebulizer solution Take 2.5 mg by nebulization every 6 (six) hours as needed for wheezing.      Marland Kitchen ALPRAZolam (XANAX) 0.25 MG tablet Take 1 tablet (0.25 mg total) by mouth daily as needed for anxiety. For anxiety  30 tablet  0  . amiodarone (PACERONE) 400 MG tablet Take 1 tablet (400 mg total) by mouth 2 (two) times daily.  60 tablet  0  . atorvastatin (LIPITOR) 20 MG tablet Take 10 mg by mouth every other day.      . diltiazem (CARDIZEM CD) 240 MG 24 hr capsule Take 1 capsule (240 mg total) by mouth daily.  30 capsule  11  . Fluticasone-Salmeterol (ADVAIR DISKUS) 250-50 MCG/DOSE AEPB Inhale 1 puff into the  lungs 2 (two) times daily.       . furosemide (LASIX) 20 MG tablet Take 1 tablet (20 mg total) by mouth daily.  30 tablet  11  . guaiFENesin ([name]) 100 MG/5ML SOLN Take 5 mLs (100 mg total) by mouth every 4 (four) hours as needed.  240 mL  0  . levothyroxine (SYNTHROID, LEVOTHROID) 25 MCG tablet Take 25 mcg by mouth daily.       . potassium chloride SA (K-DUR,KLOR-CON) 20 MEQ tablet Take 1 tablet (20 mEq total) by mouth daily.  30 tablet  11  . senna-docusate (SENOKOT-S) 8.6-50 MG per tablet Take 2 tablets by mouth at bedtime.  30 tablet  0  . temazepam (RESTORIL) 15 MG capsule Take 1 capsule (15 mg total) by mouth at bedtime.  30 capsule  0  . tiotropium (SPIRIVA) 18 MCG inhalation capsule Place 18 mcg into inhaler and inhale daily.       Marland Kitchen warfarin (COUMADIN) 2 MG tablet Take 1 tablet (2 mg total) by mouth daily. As directed. For now, take 2 mg daily, recheck 3/13.  45 tablet  11   No current facility-administered medications for this visit.    Past Medical History  Diagnosis Date  . HTN (hypertension)   . A-fib   . Dyslipidemia   . Hypothyroidism   . Stroke   . Asthma   . Shortness of breath   .  DEMENTIA   . CHF (congestive heart failure)   . GERD (gastroesophageal reflux disease)   . Anxiety   . COPD (chronic obstructive pulmonary disease)   . STROKE 04/14/2007    Past Surgical History  Procedure Laterality Date  . Total abdominal hysterectomy    . Breast surgery      45-50 YEARS AGO LUMP REMOVED  . Appendectomy    . Cardioversion  06/26/2011    Procedure: CARDIOVERSION;  Surgeon: Marca Ancona, MD;  Location: [facility] OR;  Service: Cardiovascular;  Laterality: N/A;  . Tee without cardioversion N/A 07/24/2012    Procedure: TRANSESOPHAGEAL ECHOCARDIOGRAM (TEE);  Surgeon: Pricilla Riffle, MD;  Location: [facility] ENDOSCOPY;  Service: Cardiovascular;  Laterality: N/A;  . Cardioversion N/A 07/24/2012    Procedure: CARDIOVERSION;  Surgeon: Pricilla Riffle, MD;  Location: [facility] ENDOSCOPY;   Service: Cardiovascular;  Laterality: N/A;    ROS:  As stated in the HPI and negative for all other systems.  PHYSICAL EXAM BP 130/76  Pulse 85  Ht 5\' 1"  (1.549 m)  Wt 116 lb (52.617 kg)  BMI 21.93 kg/m2  SpO2 95% PHYSICAL EXAM GEN:  No distress, very frail. NECK:  No jugular venous distention at 90 degrees, waveform within normal limits, carotid upstroke brisk and symmetric, no bruits, no thyromegaly LYMPHATICS:  No cervical adenopathy LUNGS:  Clear to auscultation bilaterally BACK:  No CVA tenderness CHEST:  Unremarkable HEART:  S1 and S2 within normal limits, no S3, no clicks, no rubs, no murmurs, irregular ABD:  Positive bowel sounds normal in frequency in pitch, no bruits, no rebound, no guarding, unable to assess midline mass or bruit with the patient seated. EXT:  2 plus pulses throughout, moderate edema, no cyanosis no clubbing SKIN:  No rashes no nodules, bruising. NEURO:  Cranial nerves II through XII grossly intact, motor grossly intact throughout PSYCH:  Cognitively intact, oriented to person place and time  ASSESSMENT AND PLAN  Atrial fibrillation She was back in fibrillation after recent cardioversion. She was deemed to have failed propafenone.  She is now on amiodarone.  However, she wants to discuss with Dr. Ladona Ridgel switching back to Rythmol.  I will reduce her a meal around 200 mg twice a day. I'll schedule her to see Dr. Ladona Ridgel at the end of the month. If they decide to continue the Imuran she could be cardioverted at that time.  Diastolic heart failure She seems to be euvolemic. She will continue the meds as listed.  HTN The blood pressure is at target. No change in medications is indicated. We will continue with therapeutic lifestyle changes (TLC).

## 2012-08-15 ENCOUNTER — Encounter: Payer: Self-pay | Admitting: Internal Medicine

## 2012-08-17 ENCOUNTER — Encounter: Payer: Self-pay | Admitting: Internal Medicine

## 2012-08-17 LAB — PROTIME-INR: INR: 2 — AB (ref 0.9–1.1)

## 2012-08-17 NOTE — Telephone Encounter (Signed)
Spoke with [name]. Connie aware of recs as listed below per Dr. Sherene Sires. [name] verbalized understanding of this. Patient will be seen by Dr. Ladona Ridgel tomorrow (Tues Aug 17, 2012) to also discuss med management for patient. [name] has also went ahead and scheduled appt w MW for Sep 29, 2012 @ 1145am at the [place] office. Nothing further needed at this time.

## 2012-08-18 ENCOUNTER — Encounter: Payer: Self-pay | Admitting: Internal Medicine

## 2012-08-18 ENCOUNTER — Ambulatory Visit (INDEPENDENT_AMBULATORY_CARE_PROVIDER_SITE_OTHER): Payer: Medicare Other | Admitting: Internal Medicine

## 2012-08-18 VITALS — BP 124/64 | HR 82 | Wt 113.0 lb

## 2012-08-18 DIAGNOSIS — Z7901 Long term (current) use of anticoagulants: Secondary | ICD-10-CM

## 2012-08-18 DIAGNOSIS — I4891 Unspecified atrial fibrillation: Secondary | ICD-10-CM

## 2012-08-18 MED ORDER — AMIODARONE HCL 200 MG PO TABS: 200.0000 mg | ORAL_TABLET | Freq: Two times a day (BID) | ORAL | Status: DC

## 2012-08-18 MED ORDER — RIVAROXABAN 15 MG PO TABS: 15.0000 mg | ORAL_TABLET | Freq: Every day | ORAL | Status: DC

## 2012-08-18 NOTE — Patient Instructions (Addendum)
Your physician has recommended that you have a Cardioversion (DCCV). Electrical Cardioversion uses a jolt of electricity to your heart either through paddles or wired patches attached to your chest. This is a controlled, usually prescheduled, procedure. Defibrillation is done under light anesthesia in the hospital, and you usually go home the day of the procedure. This is done to get your heart back into a normal rhythm. You are not awake for the procedure. Please see the instruction sheet given to you today.---week of 09/14/12  Labs one week prior with a TSH   Your physician has recommended you make the following change in your medication:  1) Stop Coumadin 2) After 3 days of stopping Coumadin Start Xarelto 15mg  daily ---Sat 08/22/12

## 2012-08-18 NOTE — Progress Notes (Signed)
HPI Julie Rich is referred today for evaluation and treatment of atrial fibrillation. The patient is 79 years old has had a history of atrial fibrillation which has been fairly well-controlled on propafenone. She has developed recurrent atrial fibrillation, underwent cardioversion, and reverted back to atrial fibrillation less than 24 hours after her cardioversion. The patient was placed on amiodarone and return to sinus rhythm. She has subsequently gone back into atrial fibrillation. She is also been on Coumadin. Her INR has been high and low, most recently below 2. On amiodarone, her ventricular rates are under better control, but she has developed a fine tremor. Her dose of amiodarone has been decreased from 400 mg twice daily to 200 mg twice daily. She has not had syncope. She does have COPD which has been fairly well-controlled. Allergies  Allergen Reactions  . Ciprofloxacin     unknown  . Quinolones     unknown  . Sulfonamide Derivatives     unknown  . Zolpidem Tartrate     Hallucinations     Current Outpatient Prescriptions  Medication Sig Dispense Refill  . albuterol (PROVENTIL) (2.5 MG/3ML) 0.083% nebulizer solution Take 2.5 mg by nebulization every 6 (six) hours as needed for wheezing.      Marland Kitchen ALPRAZolam (XANAX) 0.25 MG tablet Take 1 tablet (0.25 mg total) by mouth daily as needed for anxiety. For anxiety  30 tablet  0  . amiodarone (PACERONE) 200 MG tablet Take 1 tablet (200 mg total) by mouth 2 (two) times daily.  60 tablet  6  . atorvastatin (LIPITOR) 20 MG tablet Take 10 mg by mouth every other day.      . diltiazem (CARDIZEM CD) 240 MG 24 hr capsule Take 1 capsule (240 mg total) by mouth daily.  30 capsule  11  . Fluticasone-Salmeterol (ADVAIR DISKUS) 250-50 MCG/DOSE AEPB Inhale 1 puff into the lungs 2 (two) times daily.       . furosemide (LASIX) 20 MG tablet Take 1 tablet (20 mg total) by mouth daily.  30 tablet  11  . guaiFENesin ([name]) 100 MG/5ML SOLN Take 5 mLs (100 mg  total) by mouth every 4 (four) hours as needed.  240 mL  0  . levothyroxine (SYNTHROID, LEVOTHROID) 25 MCG tablet Take 25 mcg by mouth daily.       . potassium chloride SA (K-DUR,KLOR-CON) 20 MEQ tablet Take 1 tablet (20 mEq total) by mouth daily.  30 tablet  11  . senna-docusate (SENOKOT-S) 8.6-50 MG per tablet Take 2 tablets by mouth at bedtime.  30 tablet  0  . temazepam (RESTORIL) 15 MG capsule Take 1 capsule (15 mg total) by mouth at bedtime.  30 capsule  0  . tiotropium (SPIRIVA) 18 MCG inhalation capsule Place 18 mcg into inhaler and inhale daily.       . Rivaroxaban (XARELTO) 15 MG TABS tablet Take 1 tablet (15 mg total) by mouth daily.  30 tablet  3   No current facility-administered medications for this visit.     Past Medical History  Diagnosis Date  . HTN (hypertension)   . A-fib   . Dyslipidemia   . Hypothyroidism   . Stroke   . Asthma   . Shortness of breath   . DEMENTIA   . CHF (congestive heart failure)   . GERD (gastroesophageal reflux disease)   . Anxiety   . COPD (chronic obstructive pulmonary disease)   . STROKE 04/14/2007    ROS:   All systems reviewed and negative  except as noted in the HPI.   Past Surgical History  Procedure Laterality Date  . Total abdominal hysterectomy    . Breast surgery      45-50 YEARS AGO LUMP REMOVED  . Appendectomy    . Cardioversion  06/26/2011    Procedure: CARDIOVERSION;  Surgeon: Marca Ancona, MD;  Location: [facility] OR;  Service: Cardiovascular;  Laterality: N/A;  . Tee without cardioversion N/A 07/24/2012    Procedure: TRANSESOPHAGEAL ECHOCARDIOGRAM (TEE);  Surgeon: Pricilla Riffle, MD;  Location[facility] ENDOSCOPY;  Service: Cardiovascular;  Laterality: N/A;  . Cardioversion N/A 07/24/2012    Procedure: CARDIOVERSION;  Surgeon: Pricilla Riffle, MD;  Location: [facility] ENDOSCOPY;  Service: Cardiovascular;  Laterality: N/A;     Family History  Problem Relation Age of Onset  . Coronary artery disease Neg Hx   . Stroke Brother   . Breast  cancer Mother      History   Social History  . Marital Status: Divorced    Spouse Name: N/A    Number of Children: N/A  . Years of Education: N/A   Occupational History  . retired    Social History Main Topics  . Smoking status: Never Smoker   . Smokeless tobacco: Never Used     Comment: does not smoke   . Alcohol Use: No  . Drug Use: No  . Sexually Active: No   Other Topics Concern  . Not on file   Social History Narrative   Lives alone, has a daughter fairly close by, does not drink a lot of caffeine.      BP 124/64  Pulse 82  Wt 113 lb (51.256 kg)  BMI 21.36 kg/m2  Physical Exam:  Well appearing, frail, elderly woman, NAD HEENT: Unremarkable Neck:  No JVD, no thyromegally Lungs:  Clear with no wheezes, rales, or rhonchi. HEART:  IRegular rate rhythm, no murmurs, no rubs, no clicks Abd:  soft, positive bowel sounds, no organomegally, no rebound, no guarding Ext:  2 plus pulses, no edema, no cyanosis, no clubbing Skin:  No rashes no nodules Neuro:  CN II through XII intact, motor grossly intact  EKG - atrial fibrillation with a ventricular rate of 100 beats per minute    Assess/Plan:

## 2012-08-18 NOTE — Assessment & Plan Note (Signed)
Today I've asked the patient to stop warfarin and in 3 days start Xarelto.

## 2012-08-18 NOTE — Assessment & Plan Note (Signed)
The patient has reverted back to atrial fibrillation although her ventricular rate is under better control. I went over an extensive discussion of the treatment options with the patient and her daughter was with her today. Her anticoagulation has not been therapeutic. Her INR is been very high as well as low. Because of the problems with anticoagulation, I have recommended changing to Xarelto. She will undergo cardioversion 3 weeks after initiation of this drug. Prior to starting her new medication, she will hold warfarin for 3 days. She will continue amiodarone 200 mg twice daily. I would anticipate reducing the dose of amiodarone following cardioversion.

## 2012-08-19 ENCOUNTER — Encounter: Payer: Self-pay | Admitting: *Deleted

## 2012-08-19 ENCOUNTER — Other Ambulatory Visit: Payer: Self-pay | Admitting: *Deleted

## 2012-08-19 DIAGNOSIS — I4891 Unspecified atrial fibrillation: Secondary | ICD-10-CM

## 2012-08-19 DIAGNOSIS — Z01812 Encounter for preprocedural laboratory examination: Secondary | ICD-10-CM

## 2012-08-20 ENCOUNTER — Ambulatory Visit (INDEPENDENT_AMBULATORY_CARE_PROVIDER_SITE_OTHER): Payer: Medicare Other | Admitting: Pulmonary Disease

## 2012-08-20 ENCOUNTER — Encounter: Payer: Self-pay | Admitting: Pulmonary Disease

## 2012-08-20 ENCOUNTER — Ambulatory Visit (INDEPENDENT_AMBULATORY_CARE_PROVIDER_SITE_OTHER)
Admission: RE | Admit: 2012-08-20 | Discharge: 2012-08-20 | Disposition: A | Discharge status: Home / Self Care | Payer: Medicare Other | Source: Ambulatory Visit | Attending: Pulmonary Disease | Admitting: Pulmonary Disease

## 2012-08-20 VITALS — BP 126/64 | HR 70 | Temp 97.0°F | Ht 60.0 in | Wt 112.0 lb

## 2012-08-20 DIAGNOSIS — J449 Chronic obstructive pulmonary disease, unspecified: Secondary | ICD-10-CM

## 2012-08-20 DIAGNOSIS — R05 Cough: Secondary | ICD-10-CM

## 2012-08-20 NOTE — Assessment & Plan Note (Addendum)
Unclear cause of acute cough - no pnthx on CXR today - lt effusion persists & someinterstitial prominence - she does not appear to be in overt CHF clinically - but will increase lasix to 20 bid x 3ds, thenback down to 20 daily (since wt unreliable) Doubt amiodarone toxicity at this time - seems like she does need this for her A fibn DELSYM cough syrup thrice daily, then nightly Call for antibiotic if fever, green phlegm

## 2012-08-20 NOTE — Progress Notes (Signed)
  Subjective:    Patient ID: Julie Rich, female    DOB: 1921-12-21, 79 y.o.   MRN: [id number]  HPI 22 yowf never smoker with sob/ cough starting around 2000 & stable pulm nodules for FU  03/2007 FEV1 49%, FVC 82%  second hand smoke exposure - ? chronic bronchitis vs late onset asthma.  stable BL indeterminate nodules since 5/08.  Complex cyst in liver on MRI 1/09 likley benign.  10/09 has done well from breathing standpt , on advair once daily & spiriva  episodes of flushing, tachycardia & hypertension lasting a few hrs - maxzide started by dr perini  spirometry 10/09>> FEV1 34%, FEV1 % 58, FVC 54%  May 25, 2009  PFTs - improved - FEV1 59%, FVC 79%, DLCO 68%       08/20/2012 - Acute OV - she now sees dr wert in [place]  jan 29 th - ems called 07/16/2012 f/u ov/Wert cc back to baseline doe but not really limited from desired activities.  Feb 1-7/14  - admission for copd flare/ a flutter, toprol increased - echo ? Clot , D/C 07[facility]03/[facility]  Difficult to raise INR >2, finally had TEE cardioversion 2/28, re-admitted for CHF - lasix, rythmol changed to amio, now at [facility] - saw hochrein 3/21 - decreased amio to 200 bid  3/25 dr Ladona Ridgel - stopped coumadin, changed to xarelto, cardioversion scheduled for 4/21      Review of Systems neg for any significant sore throat, dysphagia, itching, sneezing, nasal congestion or excess/ purulent secretions, fever, chills, sweats, unintended wt loss, pleuritic or exertional cp, hempoptysis, orthopnea pnd or change in chronic leg swelling. Also denies presyncope, palpitations, heartburn, abdominal pain, nausea, vomiting, diarrhea or change in bowel or urinary habits, dysuria,hematuria, rash, arthralgias, visual complaints, headache, numbness weakness or ataxia.     Objective:   Physical Exam  Gen. Pleasant, well-nourished, in no distress ENT - no lesions, no post nasal drip Neck: No JVD, no thyromegaly, no carotid bruits Lungs: no use  of accessory muscles, no dullness to percussion, clear without rales or rhonchi  Cardiovascular: Rhythm regular, heart sounds  normal, no murmurs or gallops, no peripheral edema Musculoskeletal: No deformities, no cyanosis or clubbing         Assessment & Plan:

## 2012-08-20 NOTE — Assessment & Plan Note (Signed)
Ct advair/ spiriva Never smoker-  But does have airway obstruction

## 2012-08-20 NOTE — Patient Instructions (Addendum)
CXR today DELSYM cough syrup thrice daily, then nightly Call for antibiotic if fever, green phlegm

## 2012-08-21 ENCOUNTER — Telehealth: Payer: Self-pay | Admitting: *Deleted

## 2012-08-21 NOTE — Telephone Encounter (Signed)
Approval for Xarelto 15 mg a day  Ref # [id number]  For 1 yr  [facility] pharmacy aware

## 2012-08-24 ENCOUNTER — Other Ambulatory Visit: Payer: Self-pay | Admitting: *Deleted

## 2012-08-24 DIAGNOSIS — I4891 Unspecified atrial fibrillation: Secondary | ICD-10-CM

## 2012-08-24 MED ORDER — RIVAROXABAN 15 MG PO TABS: 15.0000 mg | ORAL_TABLET | Freq: Every day | ORAL | Status: DC

## 2012-08-26 ENCOUNTER — Telehealth: Payer: Self-pay | Admitting: Internal Medicine

## 2012-08-26 NOTE — Telephone Encounter (Signed)
I spoke with the pt daughter and she states that pt saw RA last week and she does not feel like the pt is improving at all. In fact she feels like the pt is worse. She states the pt had increased cough, SOB, weakness, not waiting, not getting out of bed on own. Pt sats has stayed above 90%, no oxygen. Pt is currently at [facility] and daughter states that the nurse there called her today and stated that she felt like the pt needs to be seen by pulmonary doctor. She states the doctor at [facility] prescribed the pt an antibiotic and pred taper but his is not helping. The daughter did not know what the abx was. She also states that she spoke with MW this weekend and he stated that the pt needed an appt. SO appt set for tomorrow at 11:45 with TP.Lachlan Mckim Yancey Flemings, CMA

## 2012-08-27 ENCOUNTER — Ambulatory Visit: Payer: Medicare Other | Admitting: Adult Health

## 2012-09-07 ENCOUNTER — Telehealth: Payer: Self-pay | Admitting: Internal Medicine

## 2012-09-07 ENCOUNTER — Telehealth: Payer: Self-pay | Admitting: Cardiology

## 2012-09-07 ENCOUNTER — Other Ambulatory Visit: Payer: Medicare Other

## 2012-09-07 ENCOUNTER — Encounter: Payer: Self-pay | Admitting: Cardiology

## 2012-09-07 NOTE — Telephone Encounter (Signed)
Ok given for labs to be drawn at [facility] and faxed to Dr. Ladona Ridgel.

## 2012-09-07 NOTE — Telephone Encounter (Signed)
New Problem:    Patient daughter called in wanting to speak with you.  Please call back.

## 2012-09-07 NOTE — Telephone Encounter (Signed)
lmom for daughter to return my call. 

## 2012-09-07 NOTE — Telephone Encounter (Signed)
Can't tell over the phone whether that'll be ok or not so best to add her on to schedule this week

## 2012-09-07 NOTE — Telephone Encounter (Signed)
lmomtcb x1 

## 2012-09-07 NOTE — Telephone Encounter (Signed)
New problem    Daughter is asking.   Can lab be drawn at the skilled facility . If so what are the lab that need to be drawn.

## 2012-09-07 NOTE — Telephone Encounter (Signed)
I spoke with the pt daughter and she states that the pt is scheduled for a cardioversion next week for atrial fib adn the daughter is concerned that the pt COPD is not well enough for her to have this. She states the pt is still having increased productive cough and SOB. She wanted to know does Dr. Sherene Sires want to see the pt before her procedure or should they leave it up to the cardiologists and anesthesiologists to make the call. They have not requested a surgical pulmonary clearance, the pt daughter wants to know. Please advise.Carron Curie, CMA

## 2012-09-07 NOTE — Telephone Encounter (Signed)
F/u    pts daughter returning your call

## 2012-09-07 NOTE — Telephone Encounter (Signed)
Spoke with daughter and she says her Mom feels like her COPD is worse but the MD says it is better.  She just feels bad and is concerned but will talk to doctors at the hospital

## 2012-09-07 NOTE — Telephone Encounter (Signed)
LMTCBx1 on first number given. I called the pt room and daughter was not there. Carron Curie, CMA

## 2012-09-08 NOTE — Telephone Encounter (Signed)
lmomtcb x2 for [employer]

## 2012-09-09 ENCOUNTER — Encounter (HOSPITAL_COMMUNITY): Payer: Self-pay | Admitting: Pharmacy Technician

## 2012-09-09 NOTE — Telephone Encounter (Signed)
Called, spoke with [name].  Informed her of below per MW. States she, [name], has now come down with a virus. She is now unable to bring pt in for OV. She is requesting to call pt to see if she wants to come in for this or not. [name] will call us back with pt's response. Will await Connie's call back.

## 2012-09-10 ENCOUNTER — Encounter (HOSPITAL_COMMUNITY): Payer: Self-pay | Admitting: Pharmacy Technician

## 2012-09-10 ENCOUNTER — Ambulatory Visit (INDEPENDENT_AMBULATORY_CARE_PROVIDER_SITE_OTHER): Payer: Medicare Other | Admitting: Internal Medicine

## 2012-09-10 ENCOUNTER — Ambulatory Visit (INDEPENDENT_AMBULATORY_CARE_PROVIDER_SITE_OTHER)
Admission: RE | Admit: 2012-09-10 | Discharge: 2012-09-10 | Disposition: A | Discharge status: Home / Self Care | Payer: Medicare Other | Source: Ambulatory Visit | Attending: Internal Medicine | Admitting: Internal Medicine

## 2012-09-10 ENCOUNTER — Encounter: Payer: Self-pay | Admitting: Internal Medicine

## 2012-09-10 VITALS — BP 110/52 | HR 90 | Temp 96.7°F | Ht 60.0 in | Wt 113.0 lb

## 2012-09-10 DIAGNOSIS — J4489 Other specified chronic obstructive pulmonary disease: Secondary | ICD-10-CM

## 2012-09-10 DIAGNOSIS — J449 Chronic obstructive pulmonary disease, unspecified: Secondary | ICD-10-CM

## 2012-09-10 NOTE — Assessment & Plan Note (Addendum)
DDX of  difficult airways managment all start with A and  include Adherence, Ace Inhibitors, Acid Reflux, Active Sinus Disease, Alpha 1 Antitripsin deficiency, Anxiety masquerading as Airways dz,  ABPA,  allergy(esp in young), Aspiration (esp in elderly), Adverse effects of DPI,  Active smokers, plus two Bs  = Bronchiectasis and Beta blocker use..and one C= CHF   Acid reflux > max rx Adverse effect of dpi seems high on list > change to nebs  ? chf suggested by cxr with bilateral effusions > defer to Cards management.

## 2012-09-10 NOTE — Telephone Encounter (Signed)
COPD patient Called spoke with patient She c/o prod cough and increased SOB Has cardioversion scheduled for Monday 4.21.14 and does want to be seen in the office today Per pt [name] is still sick with a virus but stated that someone at [facility] should be able to bring her Janey Genta @ [phone] spoke with Pattricia Boss Typically, they are unable to transport to outside offices on Thursdays but will be able to do so today @ 3pm Since pt's cardioversion is scheduled for Monday 4.21.14, appt has been scheduled ATC pt to inform her of this > line rang for approx 20sec with no answer and no option to LM Called spoke with patient's daughter [name], informed her of the above [name] will speak with her mother about this Will sign off and forward to [facility] as Lorain Childes about appt this afternoon

## 2012-09-10 NOTE — Progress Notes (Signed)
Subjective:    Patient ID: Julie Rich, female    DOB: 26-Oct-1921 .   MRN: [id number]  HPI 35 yowf never smoker  with sob/ cough starting around 2000 & stable pulm nodules for FU  03/2007 FEV1 49%, FVC 82%  second hand smoke exposure - ? chronic bronchitis vs late onset asthma.  stable BL indeterminate nodules since 5/08.  Complex cyst in liver on MRI 1/09 likley benign.       May 25, 2009   PFTs - improved - FEV1 59%, FVC 79%,   DLCO 68%     04/02/2011  Wert  tx to [facility] cc doe x [employer]  Worse with extremes of cold or heat and when goes out does so  at a slow pace.  C/o sev months of L cp x few seconds positional in nature, minimal dry cough and hoarseness.  rec Work on inhaler technique:   Stay as active as you can See if using your ventolin improves your activity tolerance   Admit date: 06/27/2012  Discharge date: 07/03/2012  Discharge Diagnoses:  Principal Problem:  *COPD exacerbation  Active Problems:  HYPERLIPIDEMIA  HYPERTENSION  Atrial fibrillation  STROKE  Atrial flutter  Diastolic CHF, chronic  Feb 1-11/2012  - admission for copd flare/ a flutter, toprol increased - echo ? Clot , D/C 07/03/12 [facility]  Difficult to raise INR >2, finally had TEE cardioversion 2/28, re-admitted for CHF - lasix, rythmol changed to amio, now at [facility] - saw hochrein 3/21 - decreased amio to 200 bid  3/25 dr Ladona Ridgel - stopped coumadin, changed to xarelto, cardioversion scheduled for 4/21   09/10/2012 f/u ov/Wert re chronic asthma Chief Complaint  Patient presents with  . Acute Visit    Pt c/o increased SOB x 2 wks. She has also had a prod cough with minimal yellow sputum for the past 2 wks. Feels like mucus is choking her.   not improving on advair, using neb qid with harsh barking quality cough. No obvious daytime variabilty or assoc   cp or chest tightness, subjective wheeze overt sinus or hb symptoms. No unusual exp hx   Sleeping ok without  nocturnal  or early am exacerbation  of respiratory  c/o's or need for noct saba. Also denies any obvious fluctuation of symptoms with weather or environmental changes or other aggravating or alleviating factors except as outlined above   ROS  The following are not active complaints unless bolded sore throat, dysphagia, dental problems, itching, sneezing,  nasal congestion or excess/ purulent secretions, ear ache,   fever, chills, sweats, unintended wt loss, pleuritic or exertional cp, hemoptysis,  orthopnea pnd or leg swelling, presyncope, palpitations, heartburn, abdominal pain, anorexia, nausea, vomiting, diarrhea  or change in bowel or urinary habits, change in stools or urine, dysuria,hematuria,  rash, arthralgias, visual complaints, headache, numbness weakness or ataxia or problems with walking or coordination,  change in mood/affect or memory.         Objective:   Physical Exam  Congested sounding barking cough  Gen. Pleasant, well-nourished, in no distress ENT - no lesions, no post nasal drip Neck: No JVD, no thyromegaly, no carotid bruits Lungs: no use of accessory muscles, no dullness to percussion, clear without rales or rhonchi  Cardiovascular: Rhythm regular, heart sounds  normal, no murmurs or gallops, no peripheral edema Musculoskeletal: No deformities, no cyanosis or clubbing   CXR  09/10/2012 :  Development of a small right pleural effusion  with persistent or recurrent small left pleural effusion. Adjacent bibasilar opacities which are most consistent with atelectasis         Assessment & Plan:

## 2012-09-10 NOTE — Patient Instructions (Addendum)
For cough mucinex 600 mg 2 every 12 hours and use flutter valve as much as possible  Stop Advair  Performist 20 mcg twice daily plus budesonide 0.25 mg twice daily  Prednisone 10 mg take  4 each am x 2 days,   2 each am x 2 days,  1 each am x2days and stop   Contiue spiriva for now although if cough and choking sensation don't improve we'll need to stop this and just add atrovent to the neb solution  As you improve, reduce nebuilzer to where only use when you can't catch your breath.   Please remember to go to the lab and x-ray department downstairs for your tests - we will call you with the results when they are available.    Try prilosec 20mg   Take 30-60 min before first meal of the day and Pepcid 20 mg one bedtime   GERD (REFLUX)  is an extremely common cause of respiratory symptoms, many times with no significant heartburn at all.    It can be treated with medication, but also with lifestyle changes including avoidance of late meals, excessive alcohol, smoking cessation, and avoid fatty foods, chocolate, peppermint, colas, red wine, and acidic juices such as orange juice.  NO MINT OR MENTHOL PRODUCTS SO NO COUGH DROPS  USE SUGARLESS CANDY INSTEAD (jolley ranchers or Stover's)  NO OIL BASED VITAMINS - use powdered substitutes.  Please remember to go to the  x-ray department downstairs for your tests - we will call you with the results when they are available.

## 2012-09-14 ENCOUNTER — Encounter (HOSPITAL_COMMUNITY)
Admission: RE | Disposition: A | Discharge status: Home / Self Care | Payer: Self-pay | Source: Ambulatory Visit | Attending: Internal Medicine

## 2012-09-14 ENCOUNTER — Ambulatory Visit (HOSPITAL_COMMUNITY)
Admission: RE | Admit: 2012-09-14 | Discharge: 2012-09-14 | Disposition: A | Discharge status: Home / Self Care | Payer: Medicare Other | Source: Ambulatory Visit | Attending: Internal Medicine | Admitting: Internal Medicine

## 2012-09-14 ENCOUNTER — Encounter (HOSPITAL_COMMUNITY): Payer: Self-pay | Admitting: *Deleted

## 2012-09-14 ENCOUNTER — Ambulatory Visit (HOSPITAL_COMMUNITY): Payer: Medicare Other | Admitting: *Deleted

## 2012-09-14 DIAGNOSIS — Z882 Allergy status to sulfonamides status: Secondary | ICD-10-CM | POA: Insufficient documentation

## 2012-09-14 DIAGNOSIS — Z883 Allergy status to other anti-infective agents status: Secondary | ICD-10-CM | POA: Insufficient documentation

## 2012-09-14 DIAGNOSIS — J449 Chronic obstructive pulmonary disease, unspecified: Secondary | ICD-10-CM | POA: Insufficient documentation

## 2012-09-14 DIAGNOSIS — Z8673 Personal history of transient ischemic attack (TIA), and cerebral infarction without residual deficits: Secondary | ICD-10-CM | POA: Insufficient documentation

## 2012-09-14 DIAGNOSIS — Z7901 Long term (current) use of anticoagulants: Secondary | ICD-10-CM | POA: Insufficient documentation

## 2012-09-14 DIAGNOSIS — I4891 Unspecified atrial fibrillation: Secondary | ICD-10-CM

## 2012-09-14 DIAGNOSIS — Z8249 Family history of ischemic heart disease and other diseases of the circulatory system: Secondary | ICD-10-CM | POA: Insufficient documentation

## 2012-09-14 DIAGNOSIS — J4489 Other specified chronic obstructive pulmonary disease: Secondary | ICD-10-CM | POA: Insufficient documentation

## 2012-09-14 DIAGNOSIS — I10 Essential (primary) hypertension: Secondary | ICD-10-CM | POA: Insufficient documentation

## 2012-09-14 DIAGNOSIS — Z79899 Other long term (current) drug therapy: Secondary | ICD-10-CM | POA: Insufficient documentation

## 2012-09-14 DIAGNOSIS — E785 Hyperlipidemia, unspecified: Secondary | ICD-10-CM | POA: Insufficient documentation

## 2012-09-14 DIAGNOSIS — IMO0002 Reserved for concepts with insufficient information to code with codable children: Secondary | ICD-10-CM | POA: Insufficient documentation

## 2012-09-14 DIAGNOSIS — I509 Heart failure, unspecified: Secondary | ICD-10-CM | POA: Insufficient documentation

## 2012-09-14 DIAGNOSIS — E039 Hypothyroidism, unspecified: Secondary | ICD-10-CM | POA: Insufficient documentation

## 2012-09-14 DIAGNOSIS — Z888 Allergy status to other drugs, medicaments and biological substances status: Secondary | ICD-10-CM | POA: Insufficient documentation

## 2012-09-14 DIAGNOSIS — Z7982 Long term (current) use of aspirin: Secondary | ICD-10-CM | POA: Insufficient documentation

## 2012-09-14 DIAGNOSIS — K219 Gastro-esophageal reflux disease without esophagitis: Secondary | ICD-10-CM | POA: Insufficient documentation

## 2012-09-14 DIAGNOSIS — F411 Generalized anxiety disorder: Secondary | ICD-10-CM | POA: Insufficient documentation

## 2012-09-14 DIAGNOSIS — R259 Unspecified abnormal involuntary movements: Secondary | ICD-10-CM | POA: Insufficient documentation

## 2012-09-14 HISTORY — PX: CARDIOVERSION: SHX1299

## 2012-09-14 LAB — BASIC METABOLIC PANEL
Calcium: 9.3 mg/dL (ref 8.4–10.5)
GFR calc non Af Amer: 38 mL/min — ABNORMAL LOW (ref >90)
Sodium: 140 mEq/L (ref 135–145)

## 2012-09-14 SURGERY — CARDIOVERSION
Anesthesia: Monitor Anesthesia Care

## 2012-09-14 MED ORDER — PROPOFOL 10 MG/ML IV BOLUS
INTRAVENOUS | Status: DC | PRN
Start: 2012-09-14 — End: 2012-09-14
  Administered 2012-09-14: 35 mg via INTRAVENOUS

## 2012-09-14 MED ORDER — LIDOCAINE HCL (CARDIAC) 20 MG/ML IV SOLN
INTRAVENOUS | Status: DC | PRN
  Administered 2012-09-14: 35 mg via INTRAVENOUS

## 2012-09-14 MED ORDER — SODIUM CHLORIDE 0.9 % IV SOLN
INTRAVENOUS | Status: DC | PRN
  Administered 2012-09-14: 14:00:00 via INTRAVENOUS

## 2012-09-14 NOTE — Op Note (Deleted)
Full report to follow 

## 2012-09-14 NOTE — H&P (View-Only) (Signed)
HPI Julie Rich is referred today for evaluation and treatment of atrial fibrillation. The patient is 79 years old has had a history of atrial fibrillation which has been fairly well-controlled on propafenone. She has developed recurrent atrial fibrillation, underwent cardioversion, and reverted back to atrial fibrillation less than 24 hours after her cardioversion. The patient was placed on amiodarone and return to sinus rhythm. She has subsequently gone back into atrial fibrillation. She is also been on Coumadin. Her INR has been high and low, most recently below 2. On amiodarone, her ventricular rates are under better control, but she has developed a fine tremor. Her dose of amiodarone has been decreased from 400 mg twice daily to 200 mg twice daily. She has not had syncope. She does have COPD which has been fairly well-controlled. Allergies  Allergen Reactions  . Ciprofloxacin     unknown  . Quinolones     unknown  . Sulfonamide Derivatives     unknown  . Zolpidem Tartrate     Hallucinations     Current Outpatient Prescriptions  Medication Sig Dispense Refill  . albuterol (PROVENTIL) (2.5 MG/3ML) 0.083% nebulizer solution Take 2.5 mg by nebulization every 6 (six) hours as needed for wheezing.      . ALPRAZolam (XANAX) 0.25 MG tablet Take 1 tablet (0.25 mg total) by mouth daily as needed for anxiety. For anxiety  30 tablet  0  . amiodarone (PACERONE) 200 MG tablet Take 1 tablet (200 mg total) by mouth 2 (two) times daily.  60 tablet  6  . atorvastatin (LIPITOR) 20 MG tablet Take 10 mg by mouth every other day.      . diltiazem (CARDIZEM CD) 240 MG 24 hr capsule Take 1 capsule (240 mg total) by mouth daily.  30 capsule  11  . Fluticasone-Salmeterol (ADVAIR DISKUS) 250-50 MCG/DOSE AEPB Inhale 1 puff into the lungs 2 (two) times daily.       . furosemide (LASIX) 20 MG tablet Take 1 tablet (20 mg total) by mouth daily.  30 tablet  11  . guaiFENesin ([name]) 100 MG/5ML SOLN Take 5 mLs (100 mg  total) by mouth every 4 (four) hours as needed.  240 mL  0  . levothyroxine (SYNTHROID, LEVOTHROID) 25 MCG tablet Take 25 mcg by mouth daily.       . potassium chloride SA (K-DUR,KLOR-CON) 20 MEQ tablet Take 1 tablet (20 mEq total) by mouth daily.  30 tablet  11  . senna-docusate (SENOKOT-S) 8.6-50 MG per tablet Take 2 tablets by mouth at bedtime.  30 tablet  0  . temazepam (RESTORIL) 15 MG capsule Take 1 capsule (15 mg total) by mouth at bedtime.  30 capsule  0  . tiotropium (SPIRIVA) 18 MCG inhalation capsule Place 18 mcg into inhaler and inhale daily.       . Rivaroxaban (XARELTO) 15 MG TABS tablet Take 1 tablet (15 mg total) by mouth daily.  30 tablet  3   No current facility-administered medications for this visit.     Past Medical History  Diagnosis Date  . HTN (hypertension)   . A-fib   . Dyslipidemia   . Hypothyroidism   . Stroke   . Asthma   . Shortness of breath   . DEMENTIA   . CHF (congestive heart failure)   . GERD (gastroesophageal reflux disease)   . Anxiety   . COPD (chronic obstructive pulmonary disease)   . STROKE 04/14/2007    ROS:   All systems reviewed and negative   except as noted in the HPI.   Past Surgical History  Procedure Laterality Date  . Total abdominal hysterectomy    . Breast surgery      45-50 YEARS AGO LUMP REMOVED  . Appendectomy    . Cardioversion  06/26/2011    Procedure: CARDIOVERSION;  Surgeon: Dalton McLean, MD;  Location: [facility] OR;  Service: Cardiovascular;  Laterality: N/A;  . Tee without cardioversion N/A 07/24/2012    Procedure: TRANSESOPHAGEAL ECHOCARDIOGRAM (TEE);  Surgeon: Paula V Ross, MD;  Location: [facility] ENDOSCOPY;  Service: Cardiovascular;  Laterality: N/A;  . Cardioversion N/A 07/24/2012    Procedure: CARDIOVERSION;  Surgeon: Paula V Ross, MD;  Location: [facility] ENDOSCOPY;  Service: Cardiovascular;  Laterality: N/A;     Family History  Problem Relation Age of Onset  . Coronary artery disease Neg Hx   . Stroke Brother   . Breast  cancer Mother      History   Social History  . Marital Status: Divorced    Spouse Name: N/A    Number of Children: N/A  . Years of Education: N/A   Occupational History  . retired    Social History Main Topics  . Smoking status: Never Smoker   . Smokeless tobacco: Never Used     Comment: does not smoke   . Alcohol Use: No  . Drug Use: No  . Sexually Active: No   Other Topics Concern  . Not on file   Social History Narrative   Lives alone, has a daughter fairly close by, does not drink a lot of caffeine.      BP 124/64  Pulse 82  Wt 113 lb (51.256 kg)  BMI 21.36 kg/m2  Physical Exam:  Well appearing, frail, elderly woman, NAD HEENT: Unremarkable Neck:  No JVD, no thyromegally Lungs:  Clear with no wheezes, rales, or rhonchi. HEART:  IRegular rate rhythm, no murmurs, no rubs, no clicks Abd:  soft, positive bowel sounds, no organomegally, no rebound, no guarding Ext:  2 plus pulses, no edema, no cyanosis, no clubbing Skin:  No rashes no nodules Neuro:  CN II through XII intact, motor grossly intact  EKG - atrial fibrillation with a ventricular rate of 100 beats per minute    Assess/Plan: 

## 2012-09-14 NOTE — Interval H&P Note (Signed)
History and Physical Interval Note:  09/14/2012 2:30 PM  Julie Rich  has presented today for surgery, with the diagnosis of a-fib  The various methods of treatment have been discussed with the patient and family. After consideration of risks, benefits and other options for treatment, the patient has consented to  Procedure(s): CARDIOVERSION (N/A) as a surgical intervention .  The patient's history has been reviewed, patient examined, no change in status, stable for surgery.  I have reviewed the patient's chart and labs.  Questions were answered to the patient's satisfaction.     [name]

## 2012-09-14 NOTE — Op Note (Signed)
Patient anesthetized with 50 mg Propofol and 50 mg lidocaine IV  With pads in AP position, patient cardoverted to SR with 200 J synchronized biphasic energy.

## 2012-09-14 NOTE — Anesthesia Postprocedure Evaluation (Signed)
  Anesthesia Post-op Note  Patient: Julie Rich  Procedure(s) Performed: Procedure(s): CARDIOVERSION (N/A)  Patient Location: Endoscopy Unit  Anesthesia Type:General  Level of Consciousness: awake  Airway and Oxygen Therapy: Patient Spontanous Breathing and Patient connected to nasal cannula oxygen  Post-op Pain: none  Post-op Assessment: Post-op Vital signs reviewed, Patient's Cardiovascular Status Stable, Respiratory Function Stable and Patent Airway  Post-op Vital Signs: Reviewed and stable  Complications: No apparent anesthesia complications

## 2012-09-14 NOTE — Preoperative (Signed)
Beta Blockers   Reason not to administer Beta Blockers:Not Applicable 

## 2012-09-14 NOTE — Progress Notes (Signed)
Quick Note:  Spoke with pt and notified of results per Dr. Wert. Pt verbalized understanding and denied any questions.  ______ 

## 2012-09-14 NOTE — Anesthesia Preprocedure Evaluation (Addendum)
Anesthesia Evaluation  Patient identified by MRN, date of birth, ID band  Reviewed: Allergy & Precautions, H&P , NPO status , Patient's Chart, lab work & pertinent test results  Airway       Dental   Pulmonary shortness of breath, asthma , COPD         Cardiovascular hypertension, Pt. on medications + Peripheral Vascular Disease and +CHF + dysrhythmias Atrial Fibrillation     Neuro/Psych PSYCHIATRIC DISORDERS Anxiety CVA    GI/Hepatic GERD-  Controlled,  Endo/Other  Hypothyroidism   Renal/GU      Musculoskeletal   Abdominal   Peds  Hematology   Anesthesia Other Findings   Reproductive/Obstetrics                           Anesthesia Physical Anesthesia Plan  ASA: III  Anesthesia Plan: MAC and General   Post-op Pain Management:    Induction: Intravenous  Airway Management Planned: Mask  Additional Equipment:   Intra-op Plan:   Post-operative Plan:   Informed Consent:   Plan Discussed with: CRNA, Anesthesiologist and Surgeon  Anesthesia Plan Comments:        Anesthesia Quick Evaluation

## 2012-09-14 NOTE — Op Note (Deleted)
Patient anesthetized with  Propofol IV  With pads in AP position patient cardioverted to SR with 200 J sychronized biphasic energy.

## 2012-09-14 NOTE — Transfer of Care (Signed)
Immediate Anesthesia Transfer of Care Note  Patient: Julie Rich  Procedure(s) Performed: Procedure(s): CARDIOVERSION (N/A)  Patient Location: Endoscopy Unit  Anesthesia Type:General  Level of Consciousness: awake and responds to stimulation  Airway & Oxygen Therapy: Patient Spontanous Breathing and Patient connected to nasal cannula oxygen  Post-op Assessment: Report given to PACU RN, Post -op Vital signs reviewed and stable and Patient moving all extremities  Post vital signs: Reviewed and stable  Complications: No apparent anesthesia complications

## 2012-09-14 NOTE — Anesthesia Postprocedure Evaluation (Signed)
  Anesthesia Post-op Note  Patient: Julie Rich  Procedure(s) Performed: Procedure(s): CARDIOVERSION (N/A)  Patient Location: PACU and Endoscopy Unit  Anesthesia Type:General  Level of Consciousness: awake, sedated and patient cooperative  Airway and Oxygen Therapy: Patient Spontanous Breathing  Post-op Pain: none  Post-op Assessment: Post-op Vital signs reviewed, Patient's Cardiovascular Status Stable, Respiratory Function Stable, Patent Airway, No signs of Nausea or vomiting and Pain level controlled  Post-op Vital Signs: stable  Complications: No apparent anesthesia complications

## 2012-09-15 ENCOUNTER — Telehealth: Payer: Self-pay | Admitting: Internal Medicine

## 2012-09-15 ENCOUNTER — Encounter (HOSPITAL_COMMUNITY): Payer: Self-pay | Admitting: Internal Medicine

## 2012-09-15 NOTE — Telephone Encounter (Signed)
Spoke with [name]-- Patient will be discharged from nursing home tomorrow. [name] is unclear about which direction to take with the instructions given at last OV for patient. 09/10/12; Patient Instructions    For cough mucinex 600 mg 2 every 12 hours and use flutter valve as much as possible  Stop Advair  Performist 20 mcg twice daily plus budesonide 0.25 mg twice daily  Prednisone 10 mg take 4 each am x 2 days, 2 each am x 2 days, 1 each am x2days and stop  Contiue spiriva for now although if cough and choking sensation don't improve we'll need to stop this and just add atrovent to the neb solution   Per [name] patient is still SOB, at the nursing home they still had her doing nebs twice daily and had not tried to wean her down Should she try to wean patient down on nebs or be seen by Dr. Sherene Sires first? Patient still on spiriva however is still coughing BUT NOT having choking sensation at all.  [name] would like to know if Advair will be restarted at all? Would also like Dr. Sherene Sires to know patient was successfully cardioverted yesterday and will be coming home on o2 from nursing home "more than likely" And would like to know when Dr. Sherene Sires would want to see her back in his office-- they do not mind coming to [facility] (usually seen in [place]) Dr. Sherene Sires please advise, thank you!

## 2012-09-15 NOTE — Telephone Encounter (Signed)
If coughing stop spiriva Continue the performist and budesonide F/u w/in week  with Tammy NP with all meds in hand

## 2012-09-15 NOTE — Telephone Encounter (Signed)
Spoke with pe daughter and advised and appt set for 09-21-12 at 2pm. Carron Curie, CMA

## 2012-09-15 NOTE — Telephone Encounter (Signed)
See prev.

## 2012-09-17 ENCOUNTER — Telehealth: Payer: Self-pay | Admitting: Internal Medicine

## 2012-09-17 NOTE — Telephone Encounter (Signed)
New Prob     Pt was just discharged from [facility], believes pt is back in afrib. She is c/o SOB, dizziness, HR irregular in 90s. Would like to speak to nurse regarding pt.

## 2012-09-17 NOTE — Telephone Encounter (Signed)
Daughter calling back stating that her mother's heart is out of rhythm.

## 2012-09-17 NOTE — Telephone Encounter (Addendum)
Spoke to [facility] and just prior to her visit with the patient she had done 2 Neb treatments back to back.  That is when her HR became elevated and they got concerned.  The HHN tried to reassure the daughter and patient that she felt the Neb tx back to back were was causing her symptoms and advised her to try and take a Xanax.  She is going to go back out tomorrow and assess her again.  She will call back if patient is still having problems.  She did not feel the patient needed to go to the hospital

## 2012-09-17 NOTE — Telephone Encounter (Signed)
Pt's daughter scheduled an appt w/ TP for Fri 09/18/12.  Julie Rich

## 2012-09-17 NOTE — Telephone Encounter (Signed)
(  continued)  Would like to know if pt stops nebs & uses only when not able to catch breath, should she be using inhalers in the interim.  Pt has appt w/ TP sched on 09/21/12 for med calendar.  Would like to know what to do in the mean time.  Thanks!  Antionette Fairy

## 2012-09-17 NOTE — Telephone Encounter (Signed)
Spoke to pt's daughter. Last time pt was here for OV, MW took her off Advair and was weaning her off Spiriva. Budesonide and Performist were started. Was told to only use nebulizer when she couldn't catch her breath. Daughter states that pt has been coughing all the time. How is she supposed to breathe if she doesn't have any inhalers? She can't do a breathing treatment all day.  Wanting MW recommendations to get through the weekend. They have an appointment with TP on 09/21/12 for Med Calendar.  Please advise MW. Thanks.

## 2012-09-17 NOTE — Telephone Encounter (Signed)
The neb is 10x more powerful than the inhalers but does take longer.  The idea was the bid performisst and bud would do so well she would hardly ever need the prns - if this is not working then very unlikely the inhalers will work any better and needs to move up appt with Tammy or see me before w/e as an add on

## 2012-09-18 ENCOUNTER — Telehealth: Payer: Self-pay | Admitting: Internal Medicine

## 2012-09-18 ENCOUNTER — Encounter: Payer: Self-pay | Admitting: Adult Health

## 2012-09-18 ENCOUNTER — Ambulatory Visit (INDEPENDENT_AMBULATORY_CARE_PROVIDER_SITE_OTHER): Payer: Medicare Other | Admitting: Adult Health

## 2012-09-18 VITALS — BP 100/60 | HR 88 | Temp 98.2°F | Ht 60.0 in | Wt 108.6 lb

## 2012-09-18 DIAGNOSIS — J45909 Unspecified asthma, uncomplicated: Secondary | ICD-10-CM

## 2012-09-18 MED ORDER — CLOTRIMAZOLE 10 MG MT TROC: 10.0000 mg | Freq: Every day | OROMUCOSAL | Status: DC

## 2012-09-18 NOTE — Assessment & Plan Note (Addendum)
Difficult to control chronic obstructive asthma, and a, never smoker. Patient has a complicated history with underlying diastolic congestive heart failure. She has had difficult to control atrial fibrillation and is now on amiodarone and Xarelto .   Plan for now , we discussed her current pulmonary regimen  May want to consider check ESR and BNP on return  Repeat CXR and /+ obtain CT chest as she is on Amiodarone   Patient's medications were reviewed today and patient education was given. Computerized medication calendar was adjusted/completed    Plan  Continue on Budesonide Neb Twice daily   Continue on Perforomist Neb Twice daily  Continue on Prilosec 20mg  daily before meal  Continue on Pepcid 20mg  At bedtime   May use Delsym 2 tsp Twice daily  As needed  Cough  May use Mucinex Twice daily  W/ flutter valve . Finish Probiotic daily   Mycelex troche five times daily for 7 days (thrush noted on exam)  Brush/rinse/gargle after use.  Continue on Oxygen 2 l/m at rest , 3l/m with activity  Advance activity as tolerated, try to sit up some during the daytime.  No eating in bed , sit up to eat.  follow up in  2 weeks with Dr. Sherene Sires

## 2012-09-18 NOTE — Telephone Encounter (Signed)
Pt seen today by TP for med calendar and follow up Had switched from RA to MW because of the [place] location Would like to switch back to RA Per MW: this is fine.  Dr Vassie Loll, please advise if you are okay with patient switching back to your services.  Thanks.  **pt will need a 4 week follow up

## 2012-09-18 NOTE — Patient Instructions (Addendum)
Continue on Budesonide Neb Twice daily   Continue on Perforomist Neb Twice daily  Continue on Prilosec 20mg  daily before meal  Continue on Pepcid 20mg  At bedtime   May use Delsym 2 tsp Twice daily  As needed  Cough  May use Mucinex Twice daily  W/ flutter valve . Finish Probiotic daily   Mycelex troche five times daily for 7 days  Brush/rinse/gargle after use.  Continue on Oxygen 2 l/m at rest , 3l/m with activity  Advance activity as tolerated, try to sit up some during the daytime.  No eating in bed , sit up to eat.  follow up in  2 weeks with Dr. Sherene Sires

## 2012-09-18 NOTE — Progress Notes (Signed)
Subjective:    Patient ID: Julie Rich, female    DOB: 03/20/1922 .   MRN: [id number]  HPI 36 yowf never smoker  with sob/ cough starting around 2000 & stable pulm nodules for FU  03/2007 FEV1 49%, FVC 82%  second hand smoke exposure - ? chronic bronchitis vs late onset asthma.  stable BL indeterminate nodules since 5/08.  Complex cyst in liver on MRI 1/09 likley benign.     May 25, 2009   PFTs - improved - FEV1 59%, FVC 79%,   DLCO 68%   04/02/2011  Wert  tx to [facility] cc doe x [employer] [facility] [employer]  Worse with extremes of cold or heat and when goes out does so  at a slow pace.  C/o sev months of L cp x few seconds positional in nature, minimal dry cough and hoarseness.  rec Work on inhaler technique:   Stay as active as you can See if using your ventolin improves your activity tolerance   Admit date: 06/27/2012  Discharge date: 07/03/2012  Discharge Diagnoses:  Principal Problem:  *COPD exacerbation  Active Problems:  HYPERLIPIDEMIA  HYPERTENSION  Atrial fibrillation  STROKE  Atrial flutter  Diastolic CHF, chronic  Feb 1-11/2012  - admission for copd flare/ a flutter, toprol increased - echo ? Clot , D/C 07/03/12 to [facility]  Difficult to raise INR >2, finally had TEE cardioversion 2/28, re-admitted for CHF - lasix, rythmol changed to amio, now at [facility] - saw hochrein 3/21 - decreased amio to 200 bid  3/25 dr Ladona Ridgel - stopped coumadin, changed to xarelto, cardioversion scheduled for 4/21   09/10/2012 f/u ov/Wert re chronic asthma Chief Complaint  Patient presents with  . Acute Visit    Pt c/o increased SOB x 2 wks. She has also had a prod cough with minimal yellow sputum for the past 2 wks. Feels like mucus is choking her.   not improving on advair, using neb qid with harsh barking quality cough. No obvious daytime variabilty or assoc   cp or chest tightness, subjective wheeze overt sinus or hb symptoms. No unusual exp hx   >>rec stop advair , rec stop  advair  Steroid taper , begin buedsonide and perforomist.    09/18/2012 Follow up and med review  Patient returns for a one-week followup and medication review. She is accompanied by her daughter today who helps her with her medications. She had multiple questions regarding her medications. Unfortunately, they  did not bring her medications in today as requested and the daughter said that she is a Teacher, early years/pre and is well aware of her medications and her mother takes.  We reviewed the previous recommendation for her nebs w/ pt education.  Daughter thought the budesonide and perforomist were to be tapered off. Did not believe she needed to be on PPI/pepcid rx.  We discussed o2 use as well as she continues to desats with walking.   Pt says she does feel about the same. No dyspnea at rest on O2. Cough-dry is main complaint.  She is staying in bed a lot and eating meals in bed.  We discussed sitting upright for meals and moving around as tolerated.  CXR last ov with bibasilar atx, small bilateral plueral effusion and 9 mm LUL nodule.  She denies any orthopnea, chest pain, hemoptysis, abdominal pain, nausea, vomiting, or increased leg swelling   ROS   Constitutional:   No  weight loss, night sweats,  Fevers, chills,  +  fatigue, or  lassitude.  HEENT:   No headaches,  Difficulty swallowing,  Tooth/dental problems, or  Sore throat,                No sneezing, itching, ear ache, nasal congestion, post nasal drip,   CV:  No chest pain,  Orthopnea, PND,   anasarca, dizziness, palpitations, syncope.   GI  No heartburn, indigestion, abdominal pain, nausea, vomiting, diarrhea, change in bowel habits, loss of appetite, bloody stools.   Resp:  No wheezing.  No chest wall deformity  Skin: no rash or lesions.  GU: no dysuria, change in color of urine, no urgency or frequency.  No flank pain, no hematuria   MS:  No joint pain or swelling.  No decreased range of motion.  No back pain.  Psych:  No  change in mood or affect. No depression or anxiety.                Objective:   Physical Exam Gen. Pleasant, frail elderly in no distress ENT - no lesions, no post nasal drip, few white patches noted  Neck: No JVD, no thyromegaly, no carotid bruits Lungs: no use of accessory muscles, no dullness to percussion, clear without rales or rhonchi  Cardiovascular: Rhythm regular, heart sounds  normal, no murmurs or gallops, tr+  peripheral edema Musculoskeletal: No deformities, no cyanosis or clubbing   CXR  09/10/2012 :  Development of a small right pleural effusion with persistent or recurrent small left pleural effusion. Adjacent bibasilar opacities which are most consistent with atelectasis         Assessment & Plan:

## 2012-09-21 ENCOUNTER — Encounter: Payer: Medicare Other | Admitting: Adult Health

## 2012-09-21 NOTE — Telephone Encounter (Signed)
OK with me.

## 2012-09-23 ENCOUNTER — Telehealth: Payer: Self-pay | Admitting: Pulmonary Disease

## 2012-09-23 NOTE — Telephone Encounter (Signed)
Error.Julie Rich ° °

## 2012-09-23 NOTE — Telephone Encounter (Signed)
Spoke with the daughter and scheduled appt with Dr Vassie Loll for 5/8 at 9 am

## 2012-09-23 NOTE — Telephone Encounter (Signed)
LMOM TCB x1 for daughter Kerrin Champagne with openings in late May As pt was previously w/ Dr Vassie Loll, the follow up will need to only be a appt

## 2012-09-23 NOTE — Telephone Encounter (Signed)
Daughter states pt needs to be seen before late may because she's sick offered her 5/8 but she says that's too early please advise.Raylene Everts

## 2012-09-24 ENCOUNTER — Ambulatory Visit (INDEPENDENT_AMBULATORY_CARE_PROVIDER_SITE_OTHER): Payer: Medicare Other | Admitting: Internal Medicine

## 2012-09-24 ENCOUNTER — Encounter: Payer: Self-pay | Admitting: Internal Medicine

## 2012-09-24 VITALS — BP 138/66 | HR 93 | Wt 111.8 lb

## 2012-09-24 DIAGNOSIS — I4891 Unspecified atrial fibrillation: Secondary | ICD-10-CM

## 2012-09-24 DIAGNOSIS — J45909 Unspecified asthma, uncomplicated: Secondary | ICD-10-CM

## 2012-09-24 NOTE — Patient Instructions (Signed)
Your physician recommends that you schedule a follow-up appointment in: 3 weeks with Dr Ladona Ridgel  Your physician has recommended you make the following change in your medication:  1) Stop Amiodarone

## 2012-09-24 NOTE — Progress Notes (Signed)
HPI Julie Rich returns today for followup. She is a very pleasant elderly woman with a history of chronic dyspnea, persistent atrial fibrillation, fairly severe bronchitis and asthma. Because she never smoked, she is not thought to have COPD. The patient's pulmonary symptoms very clearly precede her initiation of amiodarone therapy.  I saw the patient several weeks ago and she was in atrial fibrillation. Previously, she had been in sinus after initiation of amiodarone therapy. My plan at that time was to continue amiodarone for several more weeks followed by cardioversion. The patient has had worsening dyspnea. Her oxygen saturation at rest is in the high 90s, no with exertion drops below 90. She denies fevers or chills. She has minimal palpitations. Previously, in atrial fibrillation, her ventricular rate was not well-controlled. More recently, on amiodarone and calcium channel blockers, her ventricular rates have been improved.  Allergies  Allergen Reactions  . Ciprofloxacin     unknown  . Quinolones     unknown  . Sulfonamide Derivatives     unknown  . Zolpidem Tartrate     Hallucinations     Current Outpatient Prescriptions  Medication Sig Dispense Refill  . albuterol (PROAIR HFA) 108 (90 BASE) MCG/ACT inhaler Inhale 2 puffs into the lungs every 6 (six) hours as needed for wheezing or shortness of breath.      . ALPRAZolam (XANAX) 0.25 MG tablet Take 0.25 mg by mouth daily as needed for anxiety.       Marland Kitchen atorvastatin (LIPITOR) 10 MG tablet Take 10 mg by mouth every other day. At bedtime      . budesonide (PULMICORT) 0.25 MG/2ML nebulizer solution Take 0.25 mg by nebulization 2 (two) times daily.      . clotrimazole (MYCELEX) 10 MG troche Take 1 tablet (10 mg total) by mouth 5 (five) times daily.  35 tablet  0  . dextromethorphan (DELSYM) 30 MG/5ML liquid Take 60 mg by mouth at bedtime as needed for cough.      . diltiazem (CARDIZEM CD) 300 MG 24 hr capsule Take 300 mg by mouth daily.      .  famotidine (PEPCID) 20 MG tablet Take 20 mg by mouth at bedtime.      . formoterol (PERFOROMIST) 20 MCG/2ML nebulizer solution Take 20 mcg by nebulization 2 (two) times daily.      . furosemide (LASIX) 20 MG tablet Take 20 mg by mouth daily.      Marland Kitchen guaiFENesin (MUCINEX) 600 MG 12 hr tablet Take 1,200 mg by mouth every 12 (twelve) hours.      Marland Kitchen levothyroxine (SYNTHROID, LEVOTHROID) 25 MCG tablet Take 25 mcg by mouth daily.       Marland Kitchen omeprazole (PRILOSEC) 20 MG capsule Take 20 mg by mouth daily. 30-60 minutes before 1st meal of the day      . potassium chloride (KLOR-CON) 8 MEQ tablet Take 16 mEq by mouth daily.      . Probiotic Product (FLORA-Q PO) Take by mouth.      . Probiotic Product (PROBIOTIC DAILY PO) Take by mouth.      . Rivaroxaban (XARELTO) 15 MG TABS tablet Take 15 mg by mouth daily with supper.       . senna-docusate (SENOKOT-S) 8.6-50 MG per tablet Take 2 tablets by mouth at bedtime.      . temazepam (RESTORIL) 15 MG capsule Take 15 mg by mouth at bedtime.       No current facility-administered medications for this visit.     Past  Medical History  Diagnosis Date  . HTN (hypertension)   . A-fib   . Dyslipidemia   . Hypothyroidism   . Stroke   . Asthma   . Shortness of breath   . CHF (congestive heart failure)   . GERD (gastroesophageal reflux disease)   . Anxiety   . COPD (chronic obstructive pulmonary disease)     ROS:   All systems reviewed and negative except as noted in the HPI.   Past Surgical History  Procedure Laterality Date  . Total abdominal hysterectomy    . Breast surgery      45-50 YEARS AGO LUMP REMOVED  . Appendectomy    . Cardioversion  06/26/2011    Procedure: CARDIOVERSION;  Surgeon: Marca Ancona, MD;  Location: [facility] OR;  Service: Cardiovascular;  Laterality: N/A;  . Tee without cardioversion N/A 07/24/2012    Procedure: TRANSESOPHAGEAL ECHOCARDIOGRAM (TEE);  Surgeon: Pricilla Riffle, MD;  Location: [facility] ENDOSCOPY;  Service: Cardiovascular;   Laterality: N/A;  . Cardioversion N/A 07/24/2012    Procedure: CARDIOVERSION;  Surgeon: Pricilla Riffle, MD;  Location: [facility] ENDOSCOPY;  Service: Cardiovascular;  Laterality: N/A;  . Cardioversion N/A 09/14/2012    Procedure: CARDIOVERSION;  Surgeon: Pricilla Riffle, MD;  Location: [facility] ENDOSCOPY;  Service: Cardiovascular;  Laterality: N/A;     Family History  Problem Relation Age of Onset  . Coronary artery disease Neg Hx   . Stroke Brother   . Breast cancer Mother      History   Social History  . Marital Status: Divorced    Spouse Name: N/A    Number of Children: N/A  . Years of Education: N/A   Occupational History  . retired    Social History Main Topics  . Smoking status: Never Smoker   . Smokeless tobacco: Never Used     Comment: does not smoke   . Alcohol Use: No  . Drug Use: No  . Sexually Active: No   Other Topics Concern  . Not on file   Social History Narrative   Lives alone, has a daughter fairly close by, does not drink a lot of caffeine.      BP 138/66  Pulse 93  Wt 111 lb 12.8 oz (50.712 kg)  BMI 21.83 kg/m2  Physical Exam:  Chronically ill appearing, elderly woman, NAD HEENT: Unremarkable Neck:  No JVD, no thyromegally Back:  No CVA tenderness Lungs:  Scattered coarse rhonchi and wheezes, with prolonged expiration. HEART:  IRegular rate rhythm, no murmurs, no rubs, no clicks Abd:  soft, positive bowel sounds, no organomegally, no rebound, no guarding Ext:  2 plus pulses, no edema, no cyanosis, no clubbing Skin:  No rashes no nodules Neuro:  CN II through XII intact, motor grossly intact  EKG Atrial fibrillation with a controlled ventricular escape  Assess/Plan:

## 2012-09-24 NOTE — Assessment & Plan Note (Signed)
She remains in atrial fibrillation but her ventricular rate is under better control. We discussed the treatment options in detail. Her daughter who is with her today at 30 distinct written questions for me to answer. While I think she would do better in sinus rhythm, I am concerned that amiodarone in the long-term we'll not be a good medication for her. Her current lung problems very clearly preceded the initiation of amiodarone. I suspect however that her ability to maintain sinus rhythm will be predicated on her ability to take amiodarone as well as her ability to keep her bronchitis and asthma under better control. Because the latter 2 issues are unresolved and unlikely to be her reality, I've asked the patient to stop amiodarone today. I'll see her back in several weeks and we will plan to add an AV nodal blocking agent if her ventricular rates require.

## 2012-09-24 NOTE — Assessment & Plan Note (Signed)
The patient is thought to have chronic asthma,, and asthmatic bronchitis. Her lungs sound very junky today and I've recommended she followup with her lung specialist as soon as possible.

## 2012-09-25 ENCOUNTER — Other Ambulatory Visit (INDEPENDENT_AMBULATORY_CARE_PROVIDER_SITE_OTHER): Payer: Medicare Other

## 2012-09-25 ENCOUNTER — Ambulatory Visit (INDEPENDENT_AMBULATORY_CARE_PROVIDER_SITE_OTHER): Payer: Medicare Other | Admitting: Adult Health

## 2012-09-25 ENCOUNTER — Encounter: Payer: Self-pay | Admitting: Adult Health

## 2012-09-25 VITALS — BP 112/70 | HR 97 | Temp 97.6°F

## 2012-09-25 DIAGNOSIS — R0600 Dyspnea, unspecified: Secondary | ICD-10-CM

## 2012-09-25 DIAGNOSIS — R0989 Other specified symptoms and signs involving the circulatory and respiratory systems: Secondary | ICD-10-CM

## 2012-09-25 DIAGNOSIS — J45909 Unspecified asthma, uncomplicated: Secondary | ICD-10-CM

## 2012-09-25 DIAGNOSIS — I509 Heart failure, unspecified: Secondary | ICD-10-CM

## 2012-09-25 DIAGNOSIS — I5032 Chronic diastolic (congestive) heart failure: Secondary | ICD-10-CM

## 2012-09-25 DIAGNOSIS — J984 Other disorders of lung: Secondary | ICD-10-CM

## 2012-09-25 NOTE — Progress Notes (Signed)
Subjective:    Patient ID: Julie Rich, female    DOB: Mar 01, 1922 .   MRN: [id number]  HPI 69 yowf never smoker  with sob/ cough starting around 2000 & stable pulm nodules for FU  03/2007 FEV1 49%, FVC 82%  second hand smoke exposure - ? chronic bronchitis vs late onset asthma.  stable BL indeterminate nodules since 5/08.  Complex cyst in liver on MRI 1/09 likley benign.     May 25, 2009   PFTs - improved - FEV1 59%, FVC 79%,   DLCO 68%   04/02/2011  Wert  tx to [place] [facility] cc doe x [employer] [facility] [employer]  Worse with extremes of cold or heat and when goes out does so  at a slow pace.  C/o sev months of L cp x few seconds positional in nature, minimal dry cough and hoarseness.  rec Work on inhaler technique:   Stay as active as you can See if using your ventolin improves your activity tolerance   Admit date: 06/27/2012  Discharge date: 07/03/2012  Discharge Diagnoses:  Principal Problem:  *COPD exacerbation  Active Problems:  HYPERLIPIDEMIA  HYPERTENSION  Atrial fibrillation  STROKE  Atrial flutter  Diastolic CHF, chronic  Feb 1-11/2012  - admission for copd flare/ a flutter, toprol increased - echo ? Clot , D/C 07/03/12 [facility]  Difficult to raise INR >2, finally had TEE cardioversion 2/28, re-admitted for CHF - lasix, rythmol changed to amio, now at [facility] - saw hochrein 3/21 - decreased amio to 200 bid  3/25 dr Ladona Ridgel - stopped coumadin, changed to xarelto, cardioversion scheduled for 4/21   09/10/2012 f/u ov/Wert re chronic asthma Chief Complaint  Patient presents with  . Acute Visit    Pt c/o increased SOB x 2 wks. She has also had a prod cough with minimal yellow sputum for the past 2 wks. Feels like mucus is choking her.   not improving on advair, using neb qid with harsh barking quality cough. No obvious daytime variabilty or assoc   cp or chest tightness, subjective wheeze overt sinus or hb symptoms. No unusual exp hx   >>rec stop advair , rec stop  advair  Steroid taper , begin buedsonide and perforomist.    09/18/2012 Follow up and med review  Patient returns for a one-week followup and medication review. She is accompanied by her daughter today who helps her with her medications. She had multiple questions regarding her medications. Unfortunately, they  did not bring her medications in today as requested and the daughter said that she is a Teacher, early years/pre and is well aware of her medications and her mother takes.  We reviewed the previous recommendation for her nebs w/ pt education.  Daughter thought the budesonide and perforomist were to be tapered off. Did not believe she needed to be on PPI/pepcid rx.  We discussed o2 use as well as she continues to desats with walking.   Pt says she does feel about the same. No dyspnea at rest on O2. Cough-dry is main complaint.  She is staying in bed a lot and eating meals in bed.  We discussed sitting upright for meals and moving around as tolerated.  CXR last ov with bibasilar atx, small bilateral plueral effusion and 9 mm LUL nodule.  She denies any orthopnea, chest pain, hemoptysis, abdominal pain, nausea, vomiting, or increased leg swelling >>added mycelex for candida, med calendar   09/25/12 Follow up  Returns for persistent symptoms of increased  DOE and cough since last ov, occ white/clear mucus, wheezing, chest tightness. Mucus is mainly clear/to white.  She is accompanied by her daughter . They have many questions regarding her condition.  We discussed her recent cxr showing a 9 mm LUL nodule and to proceed with CT scan.  Gets winded easily despite nebs.  No hemopytsis or orthopnea. Legs swell in evening more.     ROS   Constitutional:   No  weight loss, night sweats,  Fevers, chills,  +fatigue, or  lassitude.  HEENT:   No headaches,  Difficulty swallowing,  Tooth/dental problems, or  Sore throat,                No sneezing, itching, ear ache, nasal congestion, post nasal drip,    CV:  No chest pain,  Orthopnea, PND,   anasarca, dizziness, palpitations, syncope.   GI  No heartburn, indigestion, abdominal pain, nausea, vomiting, diarrhea, change in bowel habits, loss of appetite, bloody stools.   Resp:    No chest wall deformity  Skin: no rash or lesions.  GU: .  No flank pain, no hematuria   MS:  No joint  swelling.  Psych:  No change in mood or affect. No depression or anxiety.                Objective:   Physical Exam Gen. Pleasant, frail elderly in no distress ENT - no lesions, no post nasal drip, few white patches noted  Neck: No JVD, no thyromegaly, no carotid bruits Lungs: no use of accessory muscles, no dullness to percussion, clear without rales or rhonchi  Cardiovascular: Rhythm regular, heart sounds  normal, no murmurs or gallops, tr+  peripheral edema Musculoskeletal: No deformities, no cyanosis or clubbing   CXR  09/10/2012 :  Development of a small right pleural effusion with persistent or recurrent small left pleural effusion. Adjacent bibasilar opacities which are most consistent with atelectasis         Assessment & Plan:

## 2012-09-25 NOTE — Patient Instructions (Addendum)
I will call with lab results.  We are setting you up for a CT chest .  Continue on Budesonide Neb Twice daily   Continue on Perforomist Neb Twice daily  Continue on Prilosec 20mg  daily before meal  Continue on Pepcid 20mg  At bedtime   May use Delsym 2 tsp Twice daily  As needed  Cough  May use Mucinex Twice daily  W/ flutter valve . Continue on Oxygen 2 l/m at rest , 3l/m with activity  follow up with Dr. Vassie Loll  Next week as planned

## 2012-09-26 ENCOUNTER — Telehealth: Payer: Self-pay | Admitting: Pulmonary Disease

## 2012-09-26 DIAGNOSIS — J45909 Unspecified asthma, uncomplicated: Secondary | ICD-10-CM

## 2012-09-26 DIAGNOSIS — J441 Chronic obstructive pulmonary disease with (acute) exacerbation: Secondary | ICD-10-CM

## 2012-09-26 NOTE — Telephone Encounter (Signed)
I was called by the patient's daughter; she requested refills for perforomist and pulmicort; refills called in to the home health agency pharmacy; no [phone].

## 2012-09-28 LAB — BRAIN NATRIURETIC PEPTIDE: Pro B Natriuretic peptide (BNP): 233 pg/mL — ABNORMAL HIGH (ref 0.0–100.0)

## 2012-09-29 ENCOUNTER — Telehealth: Payer: Self-pay | Admitting: Pulmonary Disease

## 2012-09-29 ENCOUNTER — Ambulatory Visit: Payer: Medicare Other | Admitting: Internal Medicine

## 2012-09-29 LAB — TSH: TSH: 1.68 u[IU]/mL (ref 0.35–5.50)

## 2012-09-29 MED ORDER — FORMOTEROL FUMARATE 20 MCG/2ML IN NEBU: 20.0000 ug | INHALATION_SOLUTION | Freq: Two times a day (BID) | RESPIRATORY_TRACT | Status: DC

## 2012-09-29 MED ORDER — BUDESONIDE 0.25 MG/2ML IN SUSP: 0.2500 mg | Freq: Two times a day (BID) | RESPIRATORY_TRACT | Status: DC

## 2012-09-29 NOTE — Telephone Encounter (Signed)
Per TP: Result Note    Labs look ok    TSH is nml    BNP is improved    ESR is minimally elevated.    Cont w/ ov recs    Please contact office for sooner follow up if symptoms do not improve or worsen or seek emergency care       Called the p 5:30pm number listed above - [name] not there, will not return for another "10 mins or so" LM w/ pt's caregiver that I will call back  Will call back tomorrow [name] had asked that pt's labs be forwarded to Dr Waynard Edwards > done Also, TP said ok for pt to use Claritin 10mg  qd prn

## 2012-09-29 NOTE — Telephone Encounter (Signed)
Called spoke with daughter Advised lab results not yet available but will call when they are available Julie Rich okay with this Julie Rich does state that they have decided not to do the CT rec'd by TP at last ov for follow up lung nodule because pt saw PCP Dr Waynard Edwards yesterday and it was discussed that her lungs "are all gloppy" and a CT would be too difficult for pt.  Also, Julie Rich stated that she is trying to have her mother seriously consider Hospice care (did not indicate why specifically).    Julie Rich wondering if the May 8 appt with RA is necessary since pt is having so much difficulty leaving the home > per TP: if pt will be in Hospice's care, ok to cancel appt; if not, would be best for her to keep it.  Julie Rich also wondering if an anti-histamine would help pt w/ secretions.    Tammy please advise, thanks!

## 2012-09-29 NOTE — Telephone Encounter (Signed)
From 5.2.14 ov w/ TP; daughter was requesting to have nebs filled thru DME company > apologies, this should have been done at [facility]. Not currently established w/ a DME Order placed  Rx's printed for TP to sign  Called spoke with [name] She is aware rx/order has been been sent

## 2012-09-29 NOTE — Telephone Encounter (Signed)
See lab results.  

## 2012-09-29 NOTE — Telephone Encounter (Signed)
ATC number provided x3. [employer]

## 2012-09-30 NOTE — Telephone Encounter (Signed)
Julie Rich returned call Advised of lab results / recs as stated by Gaylan Gerold verbalized her understanding and denied any questions Will begin Claritin 10mg  otc Not under Hospice as of now, so will keep upcoming appt with RA tomorrow @ 1115 Advised Julie Rich that if anything further is needed prior to appt to please call the office Nothing further needed; will sign off.

## 2012-10-01 ENCOUNTER — Encounter: Payer: Self-pay | Admitting: Pulmonary Disease

## 2012-10-01 ENCOUNTER — Other Ambulatory Visit: Payer: Medicare Other

## 2012-10-01 ENCOUNTER — Ambulatory Visit (INDEPENDENT_AMBULATORY_CARE_PROVIDER_SITE_OTHER): Payer: Medicare Other | Admitting: Pulmonary Disease

## 2012-10-01 VITALS — BP 134/74 | HR 100 | Temp 98.3°F

## 2012-10-01 DIAGNOSIS — J984 Other disorders of lung: Secondary | ICD-10-CM

## 2012-10-01 DIAGNOSIS — I5031 Acute diastolic (congestive) heart failure: Secondary | ICD-10-CM

## 2012-10-01 DIAGNOSIS — J45909 Unspecified asthma, uncomplicated: Secondary | ICD-10-CM

## 2012-10-01 NOTE — Assessment & Plan Note (Signed)
Does not appear to fluid overloaded on diuretics Check BNP today

## 2012-10-01 NOTE — Progress Notes (Signed)
Quick Note:  Pt's daughter is aware of lab results / recs per 5.6.14 phone note. ______

## 2012-10-01 NOTE — Assessment & Plan Note (Signed)
Even though bnp has decreased from 3500 to 300, chest x-ray continues to show small bilateral effusions and she remains symptomatic. Ct daily lasix

## 2012-10-01 NOTE — Patient Instructions (Addendum)
Call me in 1-2 weeks with update OK to switch back to advair & spiriva OK tot ake claritin or ZYRTEC for allergies until June O2 satn 88% & above - may need continuous if pulse does not work

## 2012-10-01 NOTE — Progress Notes (Signed)
  Subjective:    Patient ID: Julie Rich, female    DOB: 11-03-1921, 79 y.o.   MRN: [id number]  HPI   69 yowf never smoker with sob/ cough starting around 2000 & stable pulm nodules for FU  03/2007 FEV1 49%, FVC 82%  second hand smoke exposure - Airway obstruction has been attributed to chronic bronchitis vs late onset asthma.  stable BL indeterminate nodules since 5/08.  Complex cyst in liver on MRI 1/09 likley benign.  December, 2010 PFTs - improved - FEV1 59%, FVC 79%, DLCO 68%   Feb 1-11/2012 - admission for copd flare/ a flutter, toprol increased -  D/C 07/03/12 to [facility]  Difficult to raise INR >2, finally had TEE cardioversion 2/28, re-admitted for CHF - lasix, rythmol changed to amio, now at [facility] - saw hochrein 3/21 - decreased amio to 200 bid  Saw dr Ladona Ridgel - stopped coumadin, changed to xarelto, cardioversion ' taken off the table', amiodarone stopped    10/01/2012 Saw Dr wert at [facility] a couple of times but now back with me. Started on budesonide and perforomist (instead of advair/spiriva) & completed prednisone taper --No difference in symptoms, cards eval note ( Dr Ladona Ridgel)  Pt activity level is less so SOB is less since she is in the bed more. But anytime she gets up and move she is SOB. Pt has lots of cough and brings up clear phlem. Denies any wheezing but does have chest  'congestion'  She has gone downhill in last 3 months since hosp admission & hospice is being discussed. Pulse oxygen does not keep up with her demands and she'll often drops to 88% sat Daughter had 6 written questions for me including treatment for allergies, , palliative care involvement, and switching back to Advair and Spiriva CXR - Development of a small right pleural effusion with persistent or  recurrent small left pleural effusion    Past Medical History  Diagnosis Date  . HTN (hypertension)   . A-fib   . Dyslipidemia   . Hypothyroidism   . Stroke   . Asthma   . Shortness of  breath   . CHF (congestive heart failure)   . GERD (gastroesophageal reflux disease)   . Anxiety   . COPD (chronic obstructive pulmonary disease)      Review of Systems neg for any significant sore throat, dysphagia, itching, sneezing, nasal congestion or excess/ purulent secretions, fever, chills, sweats, unintended wt loss, pleuritic or exertional cp, hempoptysis, orthopnea pnd or change in chronic leg swelling. Also denies presyncope, palpitations, heartburn, abdominal pain, nausea, vomiting, diarrhea or change in bowel or urinary habits, dysuria,hematuria, rash, arthralgias, visual complaints, headache, numbness weakness or ataxia.     Objective:   Physical Exam  Gen. Pleasant, well-nourished, in mild distress, normal affect ENT - no lesions, no post nasal drip, on 2l [place] Neck: No JVD, no thyromegaly, no carotid bruits Lungs: no use of accessory muscles, no dullness to percussion, decreased rt base without rales or rhonchi  Cardiovascular: Rhythm regular, heart sounds  normal, no murmurs or gallops, no peripheral edema Abdomen: soft and non-tender, no hepatosplenomegaly, BS normal. Musculoskeletal: No deformities, no cyanosis or clubbing Neuro:  alert, non focal       Assessment & Plan:

## 2012-10-01 NOTE — Assessment & Plan Note (Signed)
?  poorly controlled Asthma with triggers of Reflux  ? Fluid overload contributing to symptoms Will continue on current regimen  Check labs w/ BNP  CT scan set up to eval effusion and LUL nodule   Plan  I will call with lab results.  We are setting you up for a CT chest .  Continue on Budesonide Neb Twice daily   Continue on Perforomist Neb Twice daily  Continue on Prilosec 20mg  daily before meal  Continue on Pepcid 20mg  At bedtime   May use Delsym 2 tsp Twice daily  As needed  Cough  May use Mucinex Twice daily  W/ flutter valve . Continue on Oxygen 2 l/m at rest , 3l/m with activity  follow up with Dr. Vassie Loll  Next week as planned

## 2012-10-01 NOTE — Assessment & Plan Note (Signed)
Doubt need for FU imaging in v/o overall context & deterioration

## 2012-10-01 NOTE — Assessment & Plan Note (Signed)
I doubt that her current symptoms are attributable to airway obstruction- does not have audible bronchospasm, changing from dry powder inhaler such as Advair to combination of meds such as budesonide and Perforomist  has not provided significant relief. Pulse of steroids also did not seem to help. I feel that her symptoms are more likely attributed to diastolic heart failure, she certainly seems to do better when in sinus rhythm. Unfortunately we may not able to keep her in sinus rhythm since she did not tolerate amiodarone. As such I have conveyed to her daughter that she can go back on Advair and Spiriva She will take an antihistaminic for allergies She will stay on continuous oxygen except when she is on her doctor's visits and she can use portable pulse O2 we had a frank discussion about goals of care here - symptom management and quality of life are most important to her. DNR has been formalized. I would agree with hospice consult

## 2012-10-01 NOTE — Assessment & Plan Note (Signed)
Set up CT chest 

## 2012-10-16 ENCOUNTER — Ambulatory Visit: Payer: Medicare Other | Admitting: Internal Medicine

## 2012-10-16 ENCOUNTER — Telehealth: Payer: Self-pay | Admitting: Pulmonary Disease

## 2012-10-16 NOTE — Telephone Encounter (Signed)
I spoke w/ pt daughter [name]. She stated the advair and spiriva are working fine for pt. She seems to be doing well at this time but will call if they need [place]. Will forward to RA as an [place]

## 2013-04-01 ENCOUNTER — Other Ambulatory Visit: Payer: Self-pay | Admitting: Internal Medicine

## 2013-04-01 DIAGNOSIS — R109 Unspecified abdominal pain: Secondary | ICD-10-CM

## 2013-04-02 ENCOUNTER — Ambulatory Visit
Admission: RE | Admit: 2013-04-02 | Discharge: 2013-04-02 | Disposition: A | Discharge status: Home / Self Care | Payer: Medicare Other | Source: Ambulatory Visit | Attending: Internal Medicine | Admitting: Internal Medicine

## 2013-04-02 DIAGNOSIS — R109 Unspecified abdominal pain: Secondary | ICD-10-CM

## 2013-04-06 ENCOUNTER — Other Ambulatory Visit: Payer: Self-pay | Admitting: Internal Medicine

## 2013-04-06 DIAGNOSIS — R109 Unspecified abdominal pain: Secondary | ICD-10-CM

## 2013-04-09 ENCOUNTER — Ambulatory Visit
Admission: RE | Admit: 2013-04-09 | Discharge: 2013-04-09 | Disposition: A | Discharge status: Home / Self Care | Payer: Medicare Other | Source: Ambulatory Visit | Attending: Internal Medicine | Admitting: Internal Medicine

## 2013-04-09 DIAGNOSIS — R109 Unspecified abdominal pain: Secondary | ICD-10-CM

## 2013-04-09 MED ORDER — IOHEXOL 300 MG/ML  SOLN
100.0000 mL | Freq: Once | INTRAMUSCULAR | Status: AC | PRN
  Administered 2013-04-09: 100 mL via INTRAVENOUS

## 2013-04-16 ENCOUNTER — Encounter: Payer: Self-pay | Admitting: Physician Assistant

## 2013-04-16 ENCOUNTER — Ambulatory Visit (INDEPENDENT_AMBULATORY_CARE_PROVIDER_SITE_OTHER)
Admission: RE | Admit: 2013-04-16 | Discharge: 2013-04-16 | Disposition: A | Discharge status: Home / Self Care | Payer: Medicare Other | Source: Ambulatory Visit | Attending: Physician Assistant | Admitting: Physician Assistant

## 2013-04-16 ENCOUNTER — Telehealth: Payer: Self-pay | Admitting: Physician Assistant

## 2013-04-16 ENCOUNTER — Ambulatory Visit (INDEPENDENT_AMBULATORY_CARE_PROVIDER_SITE_OTHER): Payer: Medicare Other | Admitting: Physician Assistant

## 2013-04-16 VITALS — BP 108/60 | HR 64 | Ht 60.0 in | Wt 110.0 lb

## 2013-04-16 DIAGNOSIS — R1011 Right upper quadrant pain: Secondary | ICD-10-CM

## 2013-04-16 DIAGNOSIS — G8929 Other chronic pain: Secondary | ICD-10-CM

## 2013-04-16 DIAGNOSIS — R1013 Epigastric pain: Secondary | ICD-10-CM

## 2013-04-16 MED ORDER — DIAZEPAM 2 MG PO TABS: ORAL_TABLET | ORAL | Status: DC

## 2013-04-16 NOTE — Patient Instructions (Addendum)
We have given you a prescription for Valium to take to the pharmacy. Try Salon pas, you can get these at the pharmacy.  Use 1 for 12 hours a day. Go to our basement level x-ray department before leaving today.  Stay on the 20 mg Prilosec, 1 daily.

## 2013-04-16 NOTE — Progress Notes (Signed)
Subjective:    Patient ID: Julie Rich, female    DOB: [birth year], 79 y.o.   MRN: [id number]  HPI  Julie Rich is a pleasant 79 year old white female new to GI today referred by Dr. Waynard Edwards. Patient has significant O2 dependent COPD, chronic congestive heart failure, atrial fibrillation, she is anticoagulated with Xarelto. She has history of hyperlipidemia and hypertension and prior CVA. She presents with a two-month history of epigastric pain which has not been constant at its is somewhat progressive and present on a daily basis. Patient states that it feels like spasms. She was given a trial of Flexeril without any benefit. The patient states that she hurts generally with movement or with standing and feels best lying down. She cannot tell me whether she has any increased pain with coughing. She has no correlation and her mind with by mouth intake. No change in pain postprandially. Her appetite has been fine and her weight has been stable. She denies any dysphagia or odynophagia. There's been no nausea or vomiting. She does have some occasional constipation and uses laxatives as needed. No melena or hematochezia. She says the pain does radiate bilaterally across her abdomen but she's not feeling pain in her back. She has tried tramadol does not feel this offers much relief. Her daughter asks multiple questions about possible etiology of her pain.  Patient has had CT scan of the abdomen and pelvis done on 04/09/2013 she has numerous cysts in both kidneys which have been present since 2009. 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 There are also multiple additional hepatic cysts, sigmoid diverticulosis and degenerative disc disease in the lumbar spine.    Review of Systems  Constitutional: Negative.   HENT: Negative.   Eyes: Negative.   Respiratory: Positive for  shortness of breath.   Cardiovascular: Negative.   Gastrointestinal: Positive for abdominal pain.  Endocrine: Negative.   Genitourinary: Negative.   Musculoskeletal: Negative.   Skin: Negative.   Allergic/Immunologic: Negative.   Neurological: Negative.   Hematological: Negative.   Psychiatric/Behavioral: Negative.    Outpatient Prescriptions Prior to Visit  Medication Sig Dispense Refill  . albuterol (PROAIR HFA) 108 (90 BASE) MCG/ACT inhaler Inhale 2 puffs into the lungs every 6 (six) hours as needed for wheezing or shortness of breath.      . diltiazem (CARDIZEM CD) 300 MG 24 hr capsule Take 300 mg by mouth daily.      . famotidine (PEPCID) 20 MG tablet Take 20 mg by mouth at bedtime.      . furosemide (LASIX) 20 MG tablet Take 20 mg by mouth daily.      Marland Kitchen guaiFENesin (MUCINEX) 600 MG 12 hr tablet Take 1,200 mg by mouth every 12 (twelve) hours.      Marland Kitchen LORazepam (ATIVAN) 0.5 MG tablet Take 0.5 mg by mouth 3 (three) times daily as needed for anxiety.      Marland Kitchen omeprazole (PRILOSEC) 20 MG capsule Take 20 mg by mouth daily. 30-60 minutes before 1st meal of the day      . potassium chloride (KLOR-CON) 8 MEQ tablet Take 16 mEq by mouth daily.      . Rivaroxaban (XARELTO) 15 MG TABS tablet Take 15 mg by mouth daily with supper.       . senna-docusate (SENOKOT-S) 8.6-50 MG per tablet Take 2 tablets by mouth at bedtime.      Marland Kitchen  temazepam (RESTORIL) 15 MG capsule Take 15 mg by mouth at bedtime.      Marland Kitchen levothyroxine (SYNTHROID, LEVOTHROID) 25 MCG tablet Take 50 mcg by mouth daily. Patient take 50cg every day except she takes 75 mcg on Sunday and Wednesday      . atorvastatin (LIPITOR) 10 MG tablet Take 10 mg by mouth every other day. At bedtime      . budesonide (PULMICORT) 0.25 MG/2ML nebulizer solution Take 2 mLs (0.25 mg total) by nebulization 2 (two) times daily. Dx: 491.21, 493.90, 786.2  120 mL  11  . clotrimazole (MYCELEX) 10 MG troche Take 1 tablet (10 mg total) by mouth 5 (five) times daily.   35 tablet  0  . dextromethorphan (DELSYM) 30 MG/5ML liquid Take 60 mg by mouth at bedtime as needed for cough.      . formoterol (PERFOROMIST) 20 MCG/2ML nebulizer solution Take 2 mLs (20 mcg total) by nebulization 2 (two) times daily. Dx: 491.21, 493.90, 786.2  120 mL  11  . Probiotic Product (FLORA-Q PO) Take by mouth.       No facility-administered medications prior to visit.   Allergies  Allergen Reactions  . Ciprofloxacin     unknown  . Quinolones     unknown  . Sulfonamide Derivatives     unknown  . Zolpidem Tartrate     Hallucinations   Patient Active Problem List   Diagnosis Date Noted  . Acute on chronic diastolic CHF (congestive heart failure), NYHA class 4 08/03/2012  . Physical deconditioning 08/03/2012  . Chronic anticoagulation 08/03/2012  . Acute diastolic heart failure 07/25/2012  . COPD exacerbation 06/28/2012  . Diastolic CHF, chronic 06/28/2012  . Orthostatic hypotension 07/05/2011  . Chest pain 07/05/2011  . Dizziness 07/03/2011  . UTI (lower urinary tract infection) 06/14/2011  . Atrial flutter 06/03/2011  . Dyspnea 06/03/2011  . FATIGUE 04/04/2010  . Atrial fibrillation 05/25/2009  . Cough 10/05/2007  . HYPERLIPIDEMIA 04/14/2007  . HYPERTENSION 04/14/2007  . STROKE 04/14/2007  . PULMONARY NODULE 04/14/2007  . Asthma, chronic 04/14/2007  . HEPATIC CYST 04/14/2007   History  Substance Use Topics  . Smoking status: Never Smoker   . Smokeless tobacco: Never Used     Comment: does not smoke   . Alcohol Use: No   family history includes Breast cancer in her mother; Stroke in her brother. There is no history of Coronary artery disease.     Objective:   Physical Exam  well-developed elderly white female in no acute distress, accompanied by her daughter. Patient is on portable oxygen. Blood pressure 108/60 pulse 64 height 5 foot weight 110. HEENT; nontraumatic normocephalic EOMI PERRLA sclera anicteric, Supple; no JVD, Cardiovascular; irregular rate  and rhythm with S1-S2, Pulmonary; decreased breath sounds bilaterally, Abdomen; she is basically nontender there is no palpable mass or hepatosplenomegaly bowel sounds are active no audible bruit, on standing she remains a nontender to palpation and there is no abdominal wall hernia evident. Rectal; exam not done, Extremities; no clubbing cyanosis or edema skin warm and dry, Psych; mood and affect normal and appropriate        Assessment & Plan:  #40 79 year old female with 2 month history of epigastric and subxiphoid pain exacerbated by standing and movement. She has no correlating GI symptoms and I doubt that her pain is of GI origin. Recent CT as outlined above with no findings to explain her pain though she does have multiple hepatic cysts.  I suspect her pain is  of musculoskeletal etiology and will also rule out a compression fracture. #2 chronic anticoagulation #3 severe COPD O2 dependent #4 congestive heart failure #5 atrial fibrillation #6 hypertension #7 multiple renal cysts #8 multiple hepatic cysts chronic Plan; Will check  thoracic spine films Consider CT of the chest Continue Prilosec 20 mg by mouth every morning Trial of local pain control with Salon pas patches Daughter requests a trial of low-dose Valium for a muscle relaxer. She is cautioned to try 1 to 2 mg every 6-8 hours only when a family member will be present with the patient initially Can consider upper endoscopy dose she is definitely at higher risk for complications with sedation to age  and severe COPD Followup will be with Dr. Arlyce Dice

## 2013-04-16 NOTE — Progress Notes (Signed)
Reviewed and agree with management. Robert D. Kaplan, M.D., FACG  

## 2013-04-16 NOTE — Telephone Encounter (Signed)
Left a message for Julie Rich to call me. Unable to reach her on cell number.

## 2013-04-19 ENCOUNTER — Telehealth: Payer: Self-pay | Admitting: Physician Assistant

## 2013-04-19 NOTE — Telephone Encounter (Signed)
Asking for xray results. Please, advise.

## 2013-04-20 NOTE — Telephone Encounter (Signed)
Spoke with daughter

## 2013-04-20 NOTE — Telephone Encounter (Signed)
Spoke with patient's daughter and gave her results as per Mike Gip, PA , degenerative disc disease no acute issues. Patient's daughter states the pain patch and Valium has helped the pain. Also, told her per Mike Gip, PA that she is deferring to Dr. Waynard Edwards for other non GI workup. Radiology report sent to Dr. Waynard Edwards via [facility].

## 2013-04-20 NOTE — Telephone Encounter (Signed)
Ok, good!

## 2013-05-12 ENCOUNTER — Observation Stay (HOSPITAL_COMMUNITY)
Admission: EM | Admit: 2013-05-12 | Discharge: 2013-05-13 | Disposition: A | Discharge status: Home / Self Care | Payer: Medicare Other | Attending: Internal Medicine | Admitting: Internal Medicine

## 2013-05-12 ENCOUNTER — Emergency Department (HOSPITAL_COMMUNITY): Payer: Medicare Other

## 2013-05-12 ENCOUNTER — Encounter (HOSPITAL_COMMUNITY): Payer: Self-pay | Admitting: Emergency Medicine

## 2013-05-12 DIAGNOSIS — M6250 Muscle wasting and atrophy, not elsewhere classified, unspecified site: Secondary | ICD-10-CM | POA: Insufficient documentation

## 2013-05-12 DIAGNOSIS — I4892 Unspecified atrial flutter: Secondary | ICD-10-CM

## 2013-05-12 DIAGNOSIS — Z888 Allergy status to other drugs, medicaments and biological substances status: Secondary | ICD-10-CM | POA: Insufficient documentation

## 2013-05-12 DIAGNOSIS — R0989 Other specified symptoms and signs involving the circulatory and respiratory systems: Secondary | ICD-10-CM | POA: Insufficient documentation

## 2013-05-12 DIAGNOSIS — I4891 Unspecified atrial fibrillation: Secondary | ICD-10-CM | POA: Insufficient documentation

## 2013-05-12 DIAGNOSIS — I5033 Acute on chronic diastolic (congestive) heart failure: Secondary | ICD-10-CM | POA: Insufficient documentation

## 2013-05-12 DIAGNOSIS — J441 Chronic obstructive pulmonary disease with (acute) exacerbation: Secondary | ICD-10-CM

## 2013-05-12 DIAGNOSIS — Z7901 Long term (current) use of anticoagulants: Secondary | ICD-10-CM | POA: Insufficient documentation

## 2013-05-12 DIAGNOSIS — I5031 Acute diastolic (congestive) heart failure: Secondary | ICD-10-CM

## 2013-05-12 DIAGNOSIS — R5381 Other malaise: Secondary | ICD-10-CM | POA: Insufficient documentation

## 2013-05-12 DIAGNOSIS — K219 Gastro-esophageal reflux disease without esophagitis: Secondary | ICD-10-CM | POA: Insufficient documentation

## 2013-05-12 DIAGNOSIS — R05 Cough: Secondary | ICD-10-CM

## 2013-05-12 DIAGNOSIS — R42 Dizziness and giddiness: Secondary | ICD-10-CM | POA: Insufficient documentation

## 2013-05-12 DIAGNOSIS — R911 Solitary pulmonary nodule: Secondary | ICD-10-CM | POA: Insufficient documentation

## 2013-05-12 DIAGNOSIS — F411 Generalized anxiety disorder: Secondary | ICD-10-CM | POA: Insufficient documentation

## 2013-05-12 DIAGNOSIS — R079 Chest pain, unspecified: Secondary | ICD-10-CM

## 2013-05-12 DIAGNOSIS — G8929 Other chronic pain: Secondary | ICD-10-CM | POA: Insufficient documentation

## 2013-05-12 DIAGNOSIS — J45909 Unspecified asthma, uncomplicated: Secondary | ICD-10-CM

## 2013-05-12 DIAGNOSIS — J984 Other disorders of lung: Secondary | ICD-10-CM

## 2013-05-12 DIAGNOSIS — E785 Hyperlipidemia, unspecified: Secondary | ICD-10-CM | POA: Diagnosis present

## 2013-05-12 DIAGNOSIS — R0600 Dyspnea, unspecified: Secondary | ICD-10-CM

## 2013-05-12 DIAGNOSIS — I6350 Cerebral infarction due to unspecified occlusion or stenosis of unspecified cerebral artery: Secondary | ICD-10-CM | POA: Insufficient documentation

## 2013-05-12 DIAGNOSIS — I951 Orthostatic hypotension: Secondary | ICD-10-CM | POA: Insufficient documentation

## 2013-05-12 DIAGNOSIS — R109 Unspecified abdominal pain: Secondary | ICD-10-CM | POA: Insufficient documentation

## 2013-05-12 DIAGNOSIS — I10 Essential (primary) hypertension: Secondary | ICD-10-CM | POA: Insufficient documentation

## 2013-05-12 DIAGNOSIS — R059 Cough, unspecified: Secondary | ICD-10-CM | POA: Insufficient documentation

## 2013-05-12 DIAGNOSIS — R000 Tachycardia, unspecified: Secondary | ICD-10-CM | POA: Insufficient documentation

## 2013-05-12 DIAGNOSIS — I5032 Chronic diastolic (congestive) heart failure: Secondary | ICD-10-CM | POA: Diagnosis present

## 2013-05-12 DIAGNOSIS — K7689 Other specified diseases of liver: Secondary | ICD-10-CM | POA: Insufficient documentation

## 2013-05-12 DIAGNOSIS — N390 Urinary tract infection, site not specified: Secondary | ICD-10-CM | POA: Insufficient documentation

## 2013-05-12 DIAGNOSIS — R0609 Other forms of dyspnea: Secondary | ICD-10-CM | POA: Insufficient documentation

## 2013-05-12 DIAGNOSIS — R0789 Other chest pain: Principal | ICD-10-CM | POA: Insufficient documentation

## 2013-05-12 LAB — BASIC METABOLIC PANEL
CO2: 27 mEq/L (ref 19–32)
Chloride: 103 mEq/L (ref 96–112)
Potassium: 4.1 mEq/L (ref 3.5–5.1)
Sodium: 140 mEq/L (ref 135–145)

## 2013-05-12 LAB — PRO B NATRIURETIC PEPTIDE: Pro B Natriuretic peptide (BNP): 949 pg/mL — ABNORMAL HIGH (ref 0–450)

## 2013-05-12 LAB — CBC
MCHC: 31.7 g/dL (ref 30.0–36.0)
MCV: 91.7 fL (ref 78.0–100.0)
Platelets: 202 10*3/uL (ref 150–400)
RBC: 3.72 MIL/uL — ABNORMAL LOW (ref 3.87–5.11)
WBC: 5.2 10*3/uL (ref 4.0–10.5)

## 2013-05-12 LAB — POCT I-STAT TROPONIN I: Troponin i, poc: 0 ng/mL (ref 0.00–0.08)

## 2013-05-12 MED ORDER — LORAZEPAM 1 MG PO TABS: 0.5000 mg | ORAL_TABLET | Freq: Once | ORAL | Status: DC

## 2013-05-12 MED ORDER — MORPHINE SULFATE 4 MG/ML IJ SOLN
2.0000 mg | Freq: Once | INTRAMUSCULAR | Status: AC
  Administered 2013-05-12: 2 mg via INTRAVENOUS
  Filled 2013-05-12: qty 1

## 2013-05-12 MED ORDER — LORAZEPAM 2 MG/ML IJ SOLN
0.5000 mg | Freq: Once | INTRAMUSCULAR | Status: AC
  Administered 2013-05-12: 0.5 mg via INTRAVENOUS
  Filled 2013-05-12: qty 1

## 2013-05-12 MED ORDER — GI COCKTAIL ~~LOC~~
30.0000 mL | Freq: Once | ORAL | Status: AC
  Administered 2013-05-12: 30 mL via ORAL
  Filled 2013-05-12: qty 30

## 2013-05-12 NOTE — ED Provider Notes (Signed)
CSN: [id number]     Arrival date & time 05/12/13  1840 History   First MD Initiated Contact with Patient 05/12/13 1859     Chief Complaint  Patient presents with  . Chest Pain   (Consider location/radiation/quality/duration/timing/severity/associated sxs/prior Treatment) The history is provided by the patient, a relative and medical records.   Pt is an 79yo female with hx of afib, HTN, COPD, CHF, GERD and anxiety c/o chest pain and pressure that started around 4:30pm this afternoon when pt got up from a nap.  Pt states when she went to stand up, the pain started.  Pain is constant pressure in center of chest, 4/10.  EMS gave 3 nitro and 324 asa PTA w/o relief.  Pt still c/o chest pressure. Denies SOB or palpitations but does have hx of afib.  Pt is on Xarelto.  Pt and family member also report hx of upper abdominal pain for the last several months, however pt has had multiple labs and imaging studies including MRI and U/S w/o being able to find source of pt's upper abdominal discomfort.  Pt describes this pain and dull ache.  Pt currently takes daily ativan which initially helped with abdominal pain but not anymore. Denies recent illness. States she does have a cough but that is chronic due to her COPD. Denies recent travel or sick contacts. Denies increased leg swelling. Denies n/v/d, or diaphoresis.    Past Medical History  Diagnosis Date  . HTN (hypertension)   . A-fib   . Dyslipidemia   . Hypothyroidism   . Stroke   . Asthma   . Shortness of breath   . CHF (congestive heart failure)   . GERD (gastroesophageal reflux disease)   . Anxiety   . COPD (chronic obstructive pulmonary disease)    Past Surgical History  Procedure Laterality Date  . Total abdominal hysterectomy    . Breast surgery      45-50 YEARS AGO LUMP REMOVED  . Appendectomy    . Cardioversion  06/26/2011    Procedure: CARDIOVERSION;  Surgeon: Marca Ancona, MD;  Location: [facility] OR;  Service: Cardiovascular;  Laterality:  N/A;  . Tee without cardioversion N/A 07/24/2012    Procedure: TRANSESOPHAGEAL ECHOCARDIOGRAM (TEE);  Surgeon: Pricilla Riffle, MD;  Location: [facility] ENDOSCOPY;  Service: Cardiovascular;  Laterality: N/A;  . Cardioversion N/A 07/24/2012    Procedure: CARDIOVERSION;  Surgeon: Pricilla Riffle, MD;  Location: [facility] ENDOSCOPY;  Service: Cardiovascular;  Laterality: N/A;  . Cardioversion N/A 09/14/2012    Procedure: CARDIOVERSION;  Surgeon: Pricilla Riffle, MD;  Location: [facility] ENDOSCOPY;  Service: Cardiovascular;  Laterality: N/A;   Family History  Problem Relation Age of Onset  . Coronary artery disease Neg Hx   . Stroke Brother   . Breast cancer Mother    History  Substance Use Topics  . Smoking status: Never Smoker   . Smokeless tobacco: Never Used     Comment: does not smoke   . Alcohol Use: No   OB History   Grav Para Term Preterm Abortions TAB SAB Ect Mult Living                 Review of Systems  Constitutional: Negative for fever and chills.  Respiratory: Negative for shortness of breath.   Cardiovascular: Positive for chest pain ( "pressure"). Negative for palpitations and leg swelling.  Gastrointestinal: Positive for abdominal pain ( upper "chronic"). Negative for nausea and vomiting.  All other systems reviewed and are negative.  Allergies  Ciprofloxacin; Quinolones; Sulfonamide derivatives; and Zolpidem tartrate  Home Medications   No current outpatient prescriptions on file. BP 160/83  Pulse 121  Temp(Src) 98.3 F (36.8 C) (Oral)  Resp 16  SpO2 96% Physical Exam  Nursing note and vitals reviewed. Constitutional: She appears well-developed and well-nourished. No distress.  Pt lying comfortably in exam bed, NAD.   HENT:  Head: Normocephalic and atraumatic.  Eyes: Conjunctivae are normal. No scleral icterus.  Neck: Normal range of motion. Neck supple.  Cardiovascular: Normal heart sounds.  An irregularly irregular rhythm present. Tachycardia present.   Pulmonary/Chest:  Effort normal. No respiratory distress. She has no wheezes. She has rales ( left lower lung fields). She exhibits no tenderness.  No respiratory distress, able to speak in full sentences w/o difficulty.  Lungs: coarse breath sounds in left lower lung fields.  Abdominal: Soft. Bowel sounds are normal. She exhibits no distension and no mass. There is no tenderness. There is no rebound and no guarding.  Musculoskeletal: Normal range of motion.  Neurological: She is alert.  Skin: Skin is warm and dry. She is not diaphoretic.    ED Course  Procedures (including critical care time) Labs Review Labs Reviewed  CBC - Abnormal; Notable for the following:    RBC 3.72 (*)    Hemoglobin 10.8 (*)    HCT 34.1 (*)    All other components within normal limits  BASIC METABOLIC PANEL - Abnormal; Notable for the following:    Creatinine, Ser 1.15 (*)    GFR calc non Af Amer 41 (*)    GFR calc Af Amer 47 (*)    All other components within normal limits  PRO B NATRIURETIC PEPTIDE - Abnormal; Notable for the following:    Pro B Natriuretic peptide (BNP) 949.0 (*)    All other components within normal limits  TROPONIN I  POCT I-STAT TROPONIN I   Imaging Review Dg Chest 2 View  05/12/2013   CLINICAL DATA:  Shortness of breath, hypertension  EXAM: CHEST  2 VIEW  COMPARISON:  09/10/2012  FINDINGS: Cardiomediastinal silhouette is stable. Mild degenerative changes thoracic spine. No acute infiltrate or pleural effusion. No pulmonary edema.  IMPRESSION: No active cardiopulmonary disease.   Electronically Signed   By: Natasha Mead M.D.   On: 05/12/2013 20:38    EKG Interpretation    Date/Time:  Wednesday May 12 2013 18:53:08 EST Ventricular Rate:  116 PR Interval:    QRS Duration: 85 QT Interval:  336 QTC Calculation: 467 R Axis:   -26 Text Interpretation:  Atrial fibrillation Borderline left axis deviation Anteroseptal infarct, age indeterminate Confirmed by ALLEN  MD, ANTHONY ([id number]) on 05/12/2013  7:03:45 PM            MDM   1. Asthma, chronic   2. Atrial fibrillation   3. Chest pain   4. Chronic anticoagulation   5. Unspecified essential hypertension    Pt with hx of hx of afib and CHF c/o centralized, non-radiating chest pain, unresolved with nitro and aspirin.  Pt normally on 2L oxygen via .   Currently on same, vitals unremarkable.   EKG: consistent with previous, not concerning for ACS  Troponin: negative.  BNP: elevated but not concerning for acute CHF CXR: no active cardiopulmonary disease.  Discussed pt with Dr. Freida Busman who also examined pt, will consult hospitalist to admit pt for CP r/o.    Daughter states she does not take pain medications, normally takes 0.5mg  ativan for her  chest pain as pt becomes anxious when something is "different."   Cardiology and hospitalists consulted, pt will be admitted.    Junius Finner, PA-C 05/13/13 [phone]

## 2013-05-12 NOTE — ED Provider Notes (Signed)
Medical screening examination/treatment/procedure(s) were conducted as a shared visit with non-physician practitioner(s) and myself.  I personally evaluated the patient during the encounter.  EKG Interpretation    Date/Time:  Wednesday May 12 2013 18:53:08 EST Ventricular Rate:  116 PR Interval:    QRS Duration: 85 QT Interval:  336 QTC Calculation: 467 R Axis:   -26 Text Interpretation:  Atrial fibrillation Borderline left axis deviation Anteroseptal infarct, age indeterminate Confirmed by Payton Moder  MD, Kaylin Marcon ([id number]) on 05/12/2013 7:03:45 PM           Patient here with chest pain that began today at rest. She was given nitroglycerin by EMS without resolve her symptoms. Her chest pain has been nonradiating. Patient to be seen by triad hospitalist for possible admission. She has no signs of ACS at this time. Her EKG is nonacute.  Toy Baker, MD 05/12/13 [phone]

## 2013-05-12 NOTE — ED Notes (Addendum)
Patient transported to X-ray on stretcher with oxygen @ 2 liters via [place].

## 2013-05-12 NOTE — Consult Note (Signed)
Cardiology Consult Note   Patient ID: Julie Rich MRN: [id number], DOB/AGE: [birth year]   Admit date: 05/12/2013 Date of Consult: 05/12/2013  Primary Physician: Ezequiel Kayser, MD Primary Cardiologist: Dr. Ladona Ridgel, [facility]  Reason for consult:  Chest Pain  HPI: Julie Rich is a 79 y.o. female with PMHx of chronic lung disease/asthma or non-smoking related COPD with home O2 dependence, HFpEF on lasix, chronic AF on Xarelto (last 2D surface Echo in Feb. 2014 showing mild LA dilatation), mild age-related valvular heart disease with mild AR and mild MR, HTN, HLD, hypothyroidism, anxiety and GERD.  She follows with Dr. Vassie Loll for Pulmonary care and Dr. Ladona Ridgel for Cardiac follow-up of her AFib and HTN.   She was last seen by Dr. Ladona Ridgel in May 2014 at which time she was told to stop her amiodarone; it is clearly documented that patient's pulmonary history and symptoms far pre-date the short course of amiodarone she was on in early 2014--she was intolerant of amiodarone and therefore the decision was made to pursue a rate control rather than rhythm control strategy.  She last saw Pulmonary's Dr. Vassie Loll in May 2014 as well.  She recently saw GI Dr. Monica Becton for epigastric and subxyphoid pain exacerbated by movement and standing and this was deemed to likely be non-GI in etiology thus no further workup (i.e., EGD) was indicated-per GI note.  Today the patient presents to the [facility] ED for c/o substernal constant 7/10 chest pressure for the last 7 hours without any accompanying symptoms.  She was getting out of bed around 4:30pm when the symptom started.  No N/V/diaphoresis/dry heaves/dyspnea/palpitations/lightheadedness/back pain/visual changes/weakness.  She states that she also has some pressure in her belly and that she's had abdominal discomfort in the recent weeks for which she saw GI (as outlined above).  She is careful however to state that the chest pressure is new and has never occurred before.  She  denies any recent worsening cough, fevers, chills, travel, sick contacts, increasing O2 requirements or increasing inhaler use.  Her appetite and bowel/urinary habits are unchanged.  Her weight has been stable and she denies any recent leg swelling.  CAD Risk Factors:  Age, HTN, HLD  Key Labs in ED: BNP=949, Tn after 3.5 hrs of constant chest pain=0.00, Creat=1.15 (baseline), K=4.1, Hb=10.8, WBC=5.2, PLT=202  Problem List: Past Medical History  Diagnosis Date  . HTN (hypertension)   . A-fib   . Dyslipidemia   . Hypothyroidism   . Stroke   . Asthma   . Shortness of breath   . CHF (congestive heart failure)   . GERD (gastroesophageal reflux disease)   . Anxiety   . COPD (chronic obstructive pulmonary disease)     Past Surgical History  Procedure Laterality Date  . Total abdominal hysterectomy    . Breast surgery      45-50 YEARS AGO LUMP REMOVED  . Appendectomy    . Cardioversion  06/26/2011    Procedure: CARDIOVERSION;  Surgeon: Marca Ancona, MD;  Location: [facility] OR;  Service: Cardiovascular;  Laterality: N/A;  . Tee without cardioversion N/A 07/24/2012    Procedure: TRANSESOPHAGEAL ECHOCARDIOGRAM (TEE);  Surgeon: Pricilla Riffle, MD;  Location: [facility] ENDOSCOPY;  Service: Cardiovascular;  Laterality: N/A;  . Cardioversion N/A 07/24/2012    Procedure: CARDIOVERSION;  Surgeon: Pricilla Riffle, MD;  Location: [facility] ENDOSCOPY;  Service: Cardiovascular;  Laterality: N/A;  . Cardioversion N/A 09/14/2012    Procedure: CARDIOVERSION;  Surgeon: Pricilla Riffle, MD;  Location: [facility]  ENDOSCOPY;  Service: Cardiovascular;  Laterality: N/A;     Allergies:  Allergies  Allergen Reactions  . Ciprofloxacin     unknown  . Quinolones     unknown  . Sulfonamide Derivatives     unknown  . Zolpidem Tartrate     Hallucinations    Home Medications: Prior to Admission medications   Medication Sig Start Date End Date Taking? Authorizing Provider  albuterol (PROAIR HFA) 108 (90 BASE) MCG/ACT inhaler Inhale 2 puffs  into the lungs every 6 (six) hours as needed for wheezing or shortness of breath.   Yes Historical Provider, MD  cholecalciferol (VITAMIN D) 1000 UNITS tablet Take 1,000 Units by mouth daily.   Yes Historical Provider, MD  diazepam (VALIUM) 2 MG tablet Take 1-2 mg if needed for spasms. ( cut tab in half ) 04/16/13  Yes Amy S Esterwood, PA-C  diltiazem (CARDIZEM CD) 300 MG 24 hr capsule Take 300 mg by mouth daily.   Yes Historical Provider, MD  famotidine (PEPCID) 20 MG tablet Take 20 mg by mouth at bedtime.   Yes Historical Provider, MD  Fluticasone-Salmeterol (ADVAIR DISKUS) 250-50 MCG/DOSE AEPB Inhale 2 puffs into the lungs 2 (two) times daily.   Yes Historical Provider, MD  furosemide (LASIX) 20 MG tablet Take 20 mg by mouth daily.   Yes Historical Provider, MD  guaiFENesin (MUCINEX) 600 MG 12 hr tablet Take 1,200 mg by mouth every 12 (twelve) hours.   Yes Historical Provider, MD  levothyroxine (SYNTHROID, LEVOTHROID) 50 MCG tablet Take 50 mcg by mouth daily before breakfast. Take one tablet by mouth every day except 75 mcg on Sunday and Wednesday.   Yes Historical Provider, MD  LORazepam (ATIVAN) 0.5 MG tablet Take 0.5 mg by mouth 3 (three) times daily as needed for anxiety.   Yes Historical Provider, MD  omeprazole (PRILOSEC) 20 MG capsule Take 20 mg by mouth daily. 30-60 minutes before 1st meal of the day   Yes Historical Provider, MD  OXYGEN-HELIUM IN Inhale into the lungs daily. 2 Liters   Yes Historical Provider, MD  potassium chloride (KLOR-CON) 8 MEQ tablet Take 16 mEq by mouth daily.   Yes Historical Provider, MD  Rivaroxaban (XARELTO) 15 MG TABS tablet Take 15 mg by mouth daily with supper.  08/24/12  Yes Rollene Rotunda, MD  senna-docusate (SENOKOT-S) 8.6-50 MG per tablet Take 2 tablets by mouth at bedtime. 07/03/12  Yes Kathlen Mody, MD  temazepam (RESTORIL) 15 MG capsule Take 15 mg by mouth at bedtime. 08/03/12  Yes Rhonda G Barrett, PA-C  tiotropium (SPIRIVA) 18 MCG inhalation capsule  Place 36 mcg into inhaler and inhale daily.   Yes Historical Provider, MD    Inpatient Medications:  . gi cocktail  30 mL Oral Once  . LORazepam  0.5 mg Oral Once  . morphine  2 mg Intravenous Once    (Not in a hospital admission)  Family History  Problem Relation Age of Onset  . Coronary artery disease Neg Hx   . Stroke Brother   . Breast cancer Mother      History   Social History  . Marital Status: Divorced    Spouse Name: N/A    Number of Children: N/A  . Years of Education: N/A   Occupational History  . retired    Social History Main Topics  . Smoking status: Never Smoker   . Smokeless tobacco: Never Used     Comment: does not smoke   . Alcohol Use: No  .  Drug Use: No  . Sexual Activity: No   Other Topics Concern  . Not on file   Social History Narrative   Lives alone, has a daughter fairly close by, does not drink a lot of caffeine.      Review of Systems: All other systems reviewed and are otherwise negative except as noted above.  Physical Exam: Blood pressure 151/83, pulse 112, temperature 98.3 F (36.8 C), temperature source Oral, resp. rate 20, SpO2 100.00%. General: Well developed, well nourished, in no acute distress. Head: Normocephalic, atraumatic, sclera non-icteric, no xanthomas, nares are without discharge.  Neck: Negative for carotid bruits. JVD not elevated. Lungs: Clear bilaterally to auscultation without wheezes, rales, or rhonchi. Breathing is unlabored. Heart: Irregularly irregular with S1 S2. No murmurs, rubs, or gallops appreciated. Abdomen: Soft, non-tender, non-distended with normoactive bowel sounds. No hepatomegaly. No rebound/guarding. No obvious abdominal masses. Msk:  Strength and tone appears normal for age. Extremities: No clubbing, cyanosis or edema.  Distal pedal pulses are 2+ and equal bilaterally. Neuro: Alert and oriented X 3. Moves all extremities spontaneously. Psych:  Responds to questions appropriately with a  normal affect.  Labs: Recent Labs     05/12/13  1932  WBC  5.2  HGB  10.8*  HCT  34.1*  MCV  91.7  PLT  202   No results found for this basename: VITAMINB12, FOLATE, FERRITIN, TIBC, IRON, RETICCTPCT,  in the last 72 hours No results found for this basename: DDIMER,  in the last 72 hours  Recent Labs Lab 05/12/13 1932  NA 140  K 4.1  CL 103  CO2 27  BUN 20  CREATININE 1.15*  CALCIUM 9.0  GLUCOSE 92   No results found for this basename: HGBA1C,  in the last 72 hours No results found for this basename: CKTOTAL, CKMB, CKMBINDEX, TROPONINI,  in the last 72 hours No components found with this basename: POCBNP,  No results found for this basename: CHOL, HDL, LDLCALC, TRIG, CHOLHDL, LDLDIRECT,  in the last 72 hours No results found for this basename: TSH, T4TOTAL, FREET3, T3FREE, THYROIDAB,  in the last 72 hours  Radiology/Studies: Dg Chest 2 View  05/12/2013   CLINICAL DATA:  Shortness of breath, hypertension  EXAM: CHEST  2 VIEW  COMPARISON:  09/10/2012  FINDINGS: Cardiomediastinal silhouette is stable. Mild degenerative changes thoracic spine. No acute infiltrate or pleural effusion. No pulmonary edema.  IMPRESSION: No active cardiopulmonary disease.   Electronically Signed   By: Natasha Mead M.D.   On: 05/12/2013 20:38   Dg Thoracic Spine W/swimmers  04/16/2013   CLINICAL DATA:  Thoracic level back pain  EXAM: THORACIC SPINE - 2 VIEW + SWIMMERS  COMPARISON:  Chest radiograph dated 09/10/2012  FINDINGS: Dextroscoliosis identified within the thoracolumbar spine. There is no evidence of acute fracture nor dislocation. Degenerative disc disease changes appreciated within the mid thoracic spine.  IMPRESSION: Degenerative disc disease changes within the mid thoracic spine without evidence of acute osseous abnormalities. Dextroscoliosis within the thoracic spine.   Electronically Signed   By: Salome Holmes M.D.   On: 04/16/2013 14:26    05/12/13 12-lead ECG:   AFib at 116 bpm, septal  infarct noted previously, no ST-T abnormalities.  ASSESSMENT:  79 yo F with PMHx of chronic lung disease/asthma or non-smoking related COPD with home O2 dependence, HFpEF on lasix, chronic AF on Xarelto (last 2D surface Echo in Feb. 2014 showing mild LA dilatation), mild age-related valvular heart disease with mild AR and mild MR,  HTN, HLD, hypothyroidism, anxiety and GERD.  She presents with substernal lower chest pressure near junction of xyphoid and lower sternum.  Pressure is constant for 7 hours without ischemic ECG changes and negative Tn 3.5 hours into the pressure symptoms.  She is essentially a chronic/persistent AF patient with mild RVR and I do not believe that this mild degree of RVR is contributing to her symptoms.  There is no clinical or exam evidence of decompensated CHF.  Overall, at this point she appears to have undifferentiated chest pressure without any accompanying symptoms or objective data to support ACS.  Therefore, we suggest observation to Hospitalist service and we will follow closely in consultation.  Alternative DDx such as PE is unlikely as patient is on Xarelto at home for her AF.  RECOMMENDATIONS:  1-Agree with R/O ACS and observation to Hospitalist service.  [facility] will follow along. 2-May utilize NTG-paste or SL but chest pressure is atypical and unlikely to be relieved by NTG. 3-AF with RVR:  unlikely to cause her symptoms as the RVR is only mild and she's a chronic AF patient and has never had this symptom pattern before.  Continue Xarelto and dose evening rate-control medications which she hasn't had since this morning. 4-Agree with 2D surface Echo in AM (last study in Feb. 2014 showed normal LVEF, nml wall motion, mild age-related valvular regurgitations and possible abnormal diastology but overall diastolic pattern was indeterminate). 5-Would keep NPO after midnight in order to facilitate any additional non-invasive stress testing if indicated in AM.  Thank  you for allowing [place] to care for this patient.  Please call with questions or concerns. I discussed the recommendations with the on-call Hospitalist.  Signed, Christie Nottingham, MD Cardiology [place] [facility]  05/12/2013, 10:24 PM

## 2013-05-12 NOTE — ED Notes (Signed)
Pt alert, appears in no distress.  Daughter is very anxious, asking when the physician is going to come in to see pt. Explained we are very busy, he would be in as soon as possible to speak with her about her mother's disposition.  Daughter expressed her feelings that she wishes the pt to remain here over night, that she is exhausted caring for her mother.  I explained that would be up to the physician to say if she has a condition that would require her to remain hospitalized.

## 2013-05-12 NOTE — ED Notes (Signed)
Pt from home c/o chest pressure 4/10. Pt received 3 nitro w/out relief and 324 asa pta.

## 2013-05-13 ENCOUNTER — Encounter (HOSPITAL_COMMUNITY): Payer: Self-pay

## 2013-05-13 DIAGNOSIS — I4892 Unspecified atrial flutter: Secondary | ICD-10-CM

## 2013-05-13 DIAGNOSIS — I4891 Unspecified atrial fibrillation: Secondary | ICD-10-CM

## 2013-05-13 DIAGNOSIS — I369 Nonrheumatic tricuspid valve disorder, unspecified: Secondary | ICD-10-CM

## 2013-05-13 LAB — GLUCOSE, CAPILLARY
Glucose-Capillary: 102 mg/dL — ABNORMAL HIGH (ref 70–99)
Glucose-Capillary: 111 mg/dL — ABNORMAL HIGH (ref 70–99)
Glucose-Capillary: 116 mg/dL — ABNORMAL HIGH (ref 70–99)

## 2013-05-13 LAB — TROPONIN I
Troponin I: <0.3 ng/mL (ref <0.30)
Troponin I: <0.3 ng/mL (ref <0.30)

## 2013-05-13 MED ORDER — ONDANSETRON HCL 4 MG/2ML IJ SOLN: 4.0000 mg | Freq: Four times a day (QID) | INTRAMUSCULAR | Status: DC | PRN

## 2013-05-13 MED ORDER — DILTIAZEM HCL ER COATED BEADS 300 MG PO CP24
300.0000 mg | ORAL_CAPSULE | Freq: Every day | ORAL | Status: DC
  Administered 2013-05-13: 300 mg via ORAL
  Filled 2013-05-13: qty 1

## 2013-05-13 MED ORDER — SENNOSIDES-DOCUSATE SODIUM 8.6-50 MG PO TABS
2.0000 | ORAL_TABLET | Freq: Every day | ORAL | Status: DC
  Administered 2013-05-13: 2 via ORAL
  Filled 2013-05-13 (×2): qty 2

## 2013-05-13 MED ORDER — RIVAROXABAN 15 MG PO TABS
15.0000 mg | ORAL_TABLET | Freq: Every day | ORAL | Status: DC
  Filled 2013-05-13: qty 1

## 2013-05-13 MED ORDER — NITROGLYCERIN 2 % TD OINT
1.0000 [in_us] | TOPICAL_OINTMENT | Freq: Three times a day (TID) | TRANSDERMAL | Status: DC
  Administered 2013-05-13: 1 [in_us] via TOPICAL
  Filled 2013-05-13: qty 30

## 2013-05-13 MED ORDER — TEMAZEPAM 15 MG PO CAPS: 15.0000 mg | ORAL_CAPSULE | Freq: Every day | ORAL | Status: DC

## 2013-05-13 MED ORDER — LORAZEPAM 0.5 MG PO TABS: 0.5000 mg | ORAL_TABLET | Freq: Three times a day (TID) | ORAL | Status: DC | PRN

## 2013-05-13 MED ORDER — VITAMIN D3 25 MCG (1000 UNIT) PO TABS
1000.0000 [IU] | ORAL_TABLET | Freq: Every day | ORAL | Status: DC
  Administered 2013-05-13: 1000 [IU] via ORAL
  Filled 2013-05-13: qty 1

## 2013-05-13 MED ORDER — TIOTROPIUM BROMIDE MONOHYDRATE 18 MCG IN CAPS
36.0000 ug | ORAL_CAPSULE | Freq: Every day | RESPIRATORY_TRACT | Status: DC
  Administered 2013-05-13: 36 ug via RESPIRATORY_TRACT
  Filled 2013-05-13: qty 5

## 2013-05-13 MED ORDER — OMEPRAZOLE 20 MG PO CPDR: 20.0000 mg | DELAYED_RELEASE_CAPSULE | Freq: Two times a day (BID) | ORAL | Status: DC

## 2013-05-13 MED ORDER — PANTOPRAZOLE SODIUM 40 MG IV SOLR
40.0000 mg | Freq: Two times a day (BID) | INTRAVENOUS | Status: DC
  Administered 2013-05-13: 40 mg via INTRAVENOUS
  Filled 2013-05-13 (×3): qty 40

## 2013-05-13 MED ORDER — ACETAMINOPHEN 325 MG PO TABS: 650.0000 mg | ORAL_TABLET | ORAL | Status: DC | PRN

## 2013-05-13 MED ORDER — NITROGLYCERIN 0.4 MG SL SUBL: 0.4000 mg | SUBLINGUAL_TABLET | SUBLINGUAL | Status: DC | PRN

## 2013-05-13 MED ORDER — METOPROLOL TARTRATE 25 MG PO TABS
25.0000 mg | ORAL_TABLET | Freq: Two times a day (BID) | ORAL | Status: DC
  Administered 2013-05-13: 25 mg via ORAL
  Filled 2013-05-13 (×3): qty 1

## 2013-05-13 MED ORDER — GUAIFENESIN ER 600 MG PO TB12
1200.0000 mg | ORAL_TABLET | Freq: Two times a day (BID) | ORAL | Status: DC
  Administered 2013-05-13 (×2): 1200 mg via ORAL
  Filled 2013-05-13 (×3): qty 2

## 2013-05-13 MED ORDER — POTASSIUM CHLORIDE ER 8 MEQ PO TBCR
16.0000 meq | EXTENDED_RELEASE_TABLET | Freq: Every day | ORAL | Status: DC
  Administered 2013-05-13: 16 meq via ORAL
  Filled 2013-05-13: qty 2

## 2013-05-13 MED ORDER — ALBUTEROL SULFATE HFA 108 (90 BASE) MCG/ACT IN AERS
2.0000 | INHALATION_SPRAY | Freq: Four times a day (QID) | RESPIRATORY_TRACT | Status: DC | PRN
  Filled 2013-05-13: qty 6.7

## 2013-05-13 MED ORDER — ASPIRIN EC 325 MG PO TBEC
325.0000 mg | DELAYED_RELEASE_TABLET | Freq: Every day | ORAL | Status: DC
  Filled 2013-05-13: qty 1

## 2013-05-13 MED ORDER — FUROSEMIDE 20 MG PO TABS
20.0000 mg | ORAL_TABLET | Freq: Every day | ORAL | Status: DC
  Administered 2013-05-13: 20 mg via ORAL
  Filled 2013-05-13: qty 1

## 2013-05-13 MED ORDER — LEVOTHYROXINE SODIUM 50 MCG PO TABS
50.0000 ug | ORAL_TABLET | Freq: Every day | ORAL | Status: DC
  Administered 2013-05-13: 50 ug via ORAL
  Filled 2013-05-13 (×2): qty 1

## 2013-05-13 MED ORDER — DIAZEPAM 2 MG PO TABS: 2.0000 mg | ORAL_TABLET | Freq: Four times a day (QID) | ORAL | Status: DC | PRN

## 2013-05-13 NOTE — Progress Notes (Signed)
  Echocardiogram 2D Echocardiogram has been performed.  Julie Rich 05/13/2013, 2:09 PM

## 2013-05-13 NOTE — Discharge Summary (Signed)
Physician Discharge Summary  Patient ID: DYLLAN KATS MRN: [id number] DOB/AGE: August 19, 1921 79 y.o.  Admit date: 05/12/2013 Discharge date: 05/13/2013  Primary Care Physician:  Ezequiel Kayser, MD  Discharge Diagnoses:    . atypical Chest pain resolved  . HYPERTENSION . HYPERLIPIDEMIA . Diastolic CHF, chronic . Atrial fibrillation  Consults:  Cardiology, Dr. Antoine Poche   Recommendations for Outpatient Follow-up:  Please schedule a GI followup outpatient  Allergies:   Allergies  Allergen Reactions  . Ciprofloxacin     unknown  . Quinolones     unknown  . Sulfonamide Derivatives     unknown  . Zolpidem Tartrate     Hallucinations     Discharge Medications:   Medication List         ADVAIR DISKUS 250-50 MCG/DOSE Aepb  Generic drug:  Fluticasone-Salmeterol  Inhale 2 puffs into the lungs 2 (two) times daily.     cholecalciferol 1000 UNITS tablet  Commonly known as:  VITAMIN D  Take 1,000 Units by mouth daily.     diazepam 2 MG tablet  Commonly known as:  VALIUM  Take 1-2 mg if needed for spasms. ( cut tab in half )     diltiazem 300 MG 24 hr capsule  Commonly known as:  CARDIZEM CD  Take 300 mg by mouth daily.     famotidine 20 MG tablet  Commonly known as:  PEPCID  Take 20 mg by mouth at bedtime.     furosemide 20 MG tablet  Commonly known as:  LASIX  Take 20 mg by mouth daily.     guaiFENesin 600 MG 12 hr tablet  Commonly known as:  MUCINEX  Take 1,200 mg by mouth every 12 (twelve) hours.     levothyroxine 50 MCG tablet  Commonly known as:  SYNTHROID, LEVOTHROID  Take 50 mcg by mouth daily before breakfast. Take one tablet by mouth every day except 75 mcg on Sunday and Wednesday.     LORazepam 0.5 MG tablet  Commonly known as:  ATIVAN  Take 0.5 mg by mouth 3 (three) times daily as needed for anxiety.     nitroGLYCERIN 0.4 MG SL tablet  Commonly known as:  NITROSTAT  Place 1 tablet (0.4 mg total) under the tongue every 5 (five) minutes as needed  for chest pain.     omeprazole 20 MG capsule  Commonly known as:  PRILOSEC  Take 1 capsule (20 mg total) by mouth 2 (two) times daily before a meal.     OXYGEN-HELIUM IN  Inhale into the lungs daily. 2 Liters     potassium chloride 8 MEQ tablet  Commonly known as:  KLOR-CON  Take 16 mEq by mouth daily.     PROAIR HFA 108 (90 BASE) MCG/ACT inhaler  Generic drug:  albuterol  Inhale 2 puffs into the lungs every 6 (six) hours as needed for wheezing or shortness of breath.     Rivaroxaban 15 MG Tabs tablet  Commonly known as:  XARELTO  Take 15 mg by mouth daily with supper.     senna-docusate 8.6-50 MG per tablet  Commonly known as:  Senokot-S  Take 2 tablets by mouth at bedtime.     temazepam 15 MG capsule  Commonly known as:  RESTORIL  Take 15 mg by mouth at bedtime.     tiotropium 18 MCG inhalation capsule  Commonly known as:  SPIRIVA  Place 36 mcg into inhaler and inhale daily.         Brief  H and P: For complete details please refer to admission H and P, but in brief the patient is a 79 year old female with history of hypertension, A. fib, hyperlipidemia, hypothyroidism, GERD presented with substernal chest pain without any radiation. Patient also reported chronic epigastric abdominal pain for months which has not helped with Pepcid. She reported epigastric abdominal pain and in her chest. Patient was given GI cocktail, morphine, Ativan with much improvement in her pain at the time of admission.  Hospital Course:  Atypical chest pain: Patient was admitted and ruled out for acute ACS. Cardiac enzymes remained negative. Cardiology was consulted and did not recommend any invasive or noninvasive interventions. EKG did not show a cute ST-T wave changes congestive of ischemia. 2-D echo showed EF of 55-60%, no regional wall motion abnormalities, moderate TR. Patient did mentioned worsening acid reflux, patient was started on PPI BID, for now continued on Pepcid. She had no further  symptoms of chest pain or epigastric pain during the hospitalization. I strongly recommend outpatient GI evaluation if her symptoms continue. Patient was DC'd home in good condition, she was eating her diet without any difficulty, without any GERD symptoms.   Day of Discharge BP 131/60  Pulse 94  Temp(Src) 98.3 F (36.8 C) (Oral)  Resp 18  Ht 5' (1.524 m)  Wt 49.714 kg (109 lb 9.6 oz)  BMI 21.40 kg/m2  SpO2 98%  Physical Exam: General: Alert and awake oriented x3 not in any acute distress. CVS: S1-S2 clear no murmur rubs or gallops Chest: clear to auscultation bilaterally, no wheezing rales or rhonchi Abdomen: soft nontender, nondistended, normal bowel sounds Extremities: no cyanosis, clubbing or edema noted bilaterally Neuro: Cranial nerves II-XII intact, no focal neurological deficits   The results of significant diagnostics from this hospitalization (including imaging, microbiology, ancillary and laboratory) are listed below for reference.    LAB RESULTS: Basic Metabolic Panel:  Recent Labs Lab 05/12/13 1932  NA 140  K 4.1  CL 103  CO2 27  GLUCOSE 92  BUN 20  CREATININE 1.15*  CALCIUM 9.0   Liver Function Tests: No results found for this basename: AST, ALT, ALKPHOS, BILITOT, PROT, ALBUMIN,  in the last 168 hours No results found for this basename: LIPASE, AMYLASE,  in the last 168 hours No results found for this basename: AMMONIA,  in the last 168 hours CBC:  Recent Labs Lab 05/12/13 1932  WBC 5.2  HGB 10.8*  HCT 34.1*  MCV 91.7  PLT 202   Cardiac Enzymes:  Recent Labs Lab 05/13/13 0930 05/13/13 1210  TROPONINI <0.30 <0.30   BNP: No components found with this basename: POCBNP,  CBG:  Recent Labs Lab 05/13/13 0736 05/13/13 1155  GLUCAP 102* 111*    Significant Diagnostic Studies:  Dg Chest 2 View  05/12/2013   CLINICAL DATA:  Shortness of breath, hypertension  EXAM: CHEST  2 VIEW  COMPARISON:  09/10/2012  FINDINGS: Cardiomediastinal  silhouette is stable. Mild degenerative changes thoracic spine. No acute infiltrate or pleural effusion. No pulmonary edema.  IMPRESSION: No active cardiopulmonary disease.   Electronically Signed   By: Natasha Mead M.D.   On: 05/12/2013 20:38    2D ECHO:  Study Conclusions  - Left ventricle: The cavity size was normal. Wall thickness was normal. Systolic function was normal. The estimated ejection fraction was in the range of 55% to 60%. Wall motion was normal; there were no regional wall motion abnormalities. - Aortic valve: Mild regurgitation. - Mitral valve: Calcified annulus. Mild  regurgitation. - Left atrium: The atrium was moderately dilated. - Right atrium: The atrium was moderately dilated. - Tricuspid valve: Moderate regurgitation. - Pulmonary arteries: Systolic pressure was mildly to moderately increased. PA peak pressure: 40mm Hg (S). - Pericardium, extracardiac: A trivial pericardial effusion was identified.   Disposition and Follow-up:     Discharge Orders   Future Orders Complete By Expires   Diet - low sodium heart healthy  As directed    Increase activity slowly  As directed        DISPOSITION: Home DIET: Heart healthy diet   DISCHARGE FOLLOW-UP Follow-up Information   Follow up with PERINI,MARK A, MD. Schedule an appointment as soon as possible for a visit in 10 days. (for hospital followup)    Specialty:  Internal Medicine   Contact information:   [address] [place] [address] [phone]       Follow up with Lewayne Bunting, MD. Schedule an appointment as soon as possible for a visit in 2 weeks. [facility] followup)    Specialty:  Cardiology   Contact information:   [address] [place] [address] [phone]       Time spent on Discharge: 35 minutes  Signed:   Ayo Smoak M.D. [facility] 05/13/2013, 3:26 PM Pager: [phone]

## 2013-05-13 NOTE — ED Notes (Signed)
Report to [facility] on 3W.

## 2013-05-13 NOTE — H&P (Signed)
PCP:   Ezequiel Kayser, MD   Chief Complaint:  cp  HPI: 79 yo female with sscp without radiation since 430p.  Pt has had chronic epigastric abd pain for months, not helped with home pepcid.  She is now having epigastric abd pain but now its also in her chest.  No sob.  No n/v.  No le edema or swelling.  Has not had egd.  No fevers.  No cough.  Pain not relieved or worse with eating.  Pain was not relieved with sl ntg or asa.  She has just been given a gi cocktail, morphine, and ativan and has had much improvement in her pain now.    Review of Systems:  Positive and negative as per HPI otherwise all other systems are negative  Past Medical History: Past Medical History  Diagnosis Date  . HTN (hypertension)   . A-fib   . Dyslipidemia   . Hypothyroidism   . Stroke   . Asthma   . Shortness of breath   . CHF (congestive heart failure)   . GERD (gastroesophageal reflux disease)   . Anxiety   . COPD (chronic obstructive pulmonary disease)    Past Surgical History  Procedure Laterality Date  . Total abdominal hysterectomy    . Breast surgery      45-50 YEARS AGO LUMP REMOVED  . Appendectomy    . Cardioversion  06/26/2011    Procedure: CARDIOVERSION;  Surgeon: Marca Ancona, MD;  Location: [facility] OR;  Service: Cardiovascular;  Laterality: N/A;  . Tee without cardioversion N/A 07/24/2012    Procedure: TRANSESOPHAGEAL ECHOCARDIOGRAM (TEE);  Surgeon: Pricilla Riffle, MD;  Location: [facility] ENDOSCOPY;  Service: Cardiovascular;  Laterality: N/A;  . Cardioversion N/A 07/24/2012    Procedure: CARDIOVERSION;  Surgeon: Pricilla Riffle, MD;  Location: [facility] ENDOSCOPY;  Service: Cardiovascular;  Laterality: N/A;  . Cardioversion N/A 09/14/2012    Procedure: CARDIOVERSION;  Surgeon: Pricilla Riffle, MD;  Location: [facility] ENDOSCOPY;  Service: Cardiovascular;  Laterality: N/A;    Medications: Prior to Admission medications   Medication Sig Start Date End Date Taking? Authorizing Provider  albuterol (PROAIR HFA) 108 (90  BASE) MCG/ACT inhaler Inhale 2 puffs into the lungs every 6 (six) hours as needed for wheezing or shortness of breath.   Yes Historical Provider, MD  cholecalciferol (VITAMIN D) 1000 UNITS tablet Take 1,000 Units by mouth daily.   Yes Historical Provider, MD  diazepam (VALIUM) 2 MG tablet Take 1-2 mg if needed for spasms. ( cut tab in half ) 04/16/13  Yes Amy S Esterwood, PA-C  diltiazem (CARDIZEM CD) 300 MG 24 hr capsule Take 300 mg by mouth daily.   Yes Historical Provider, MD  famotidine (PEPCID) 20 MG tablet Take 20 mg by mouth at bedtime.   Yes Historical Provider, MD  Fluticasone-Salmeterol (ADVAIR DISKUS) 250-50 MCG/DOSE AEPB Inhale 2 puffs into the lungs 2 (two) times daily.   Yes Historical Provider, MD  furosemide (LASIX) 20 MG tablet Take 20 mg by mouth daily.   Yes Historical Provider, MD  guaiFENesin (MUCINEX) 600 MG 12 hr tablet Take 1,200 mg by mouth every 12 (twelve) hours.   Yes Historical Provider, MD  levothyroxine (SYNTHROID, LEVOTHROID) 50 MCG tablet Take 50 mcg by mouth daily before breakfast. Take one tablet by mouth every day except 75 mcg on Sunday and Wednesday.   Yes Historical Provider, MD  LORazepam (ATIVAN) 0.5 MG tablet Take 0.5 mg by mouth 3 (three) times daily as needed for anxiety.  Yes Historical Provider, MD  omeprazole (PRILOSEC) 20 MG capsule Take 20 mg by mouth daily. 30-60 minutes before 1st meal of the day   Yes Historical Provider, MD  OXYGEN-HELIUM IN Inhale into the lungs daily. 2 Liters   Yes Historical Provider, MD  potassium chloride (KLOR-CON) 8 MEQ tablet Take 16 mEq by mouth daily.   Yes Historical Provider, MD  Rivaroxaban (XARELTO) 15 MG TABS tablet Take 15 mg by mouth daily with supper.  08/24/12  Yes Rollene Rotunda, MD  senna-docusate (SENOKOT-S) 8.6-50 MG per tablet Take 2 tablets by mouth at bedtime. 07/03/12  Yes Kathlen Mody, MD  temazepam (RESTORIL) 15 MG capsule Take 15 mg by mouth at bedtime. 08/03/12  Yes Rhonda G Barrett, PA-C  tiotropium  (SPIRIVA) 18 MCG inhalation capsule Place 36 mcg into inhaler and inhale daily.   Yes Historical Provider, MD    Allergies:   Allergies  Allergen Reactions  . Ciprofloxacin     unknown  . Quinolones     unknown  . Sulfonamide Derivatives     unknown  . Zolpidem Tartrate     Hallucinations    Social History:  reports that she has never smoked. She has never used smokeless tobacco. She reports that she does not drink alcohol or use illicit drugs.  Family History: Family History  Problem Relation Age of Onset  . Coronary artery disease Neg Hx   . Stroke Brother   . Breast cancer Mother     Physical Exam: Filed Vitals:   05/12/13 1945 05/12/13 2100 05/12/13 2130 05/12/13 2145  BP: 109/56 151/83 139/66 160/83  Pulse: 124 112 106 121  Temp:      TempSrc:      Resp: 23 20 28 16   SpO2: 100% 100% 100% 96%   General appearance: alert, cooperative and no distress Head: Normocephalic, without obvious abnormality, atraumatic Eyes: negative Nose: Nares normal. Septum midline. Mucosa normal. No drainage or sinus tenderness. Neck: no JVD and supple, symmetrical, trachea midline Lungs: clear to auscultation bilaterally Heart: regular rate and rhythm, S1, S2 normal, no murmur, click, rub or gallop Abdomen: soft, non-tender; bowel sounds normal; no masses,  no organomegaly Extremities: extremities normal, atraumatic, no cyanosis or edema Pulses: 2+ and symmetric Skin: Skin color, texture, turgor normal. No rashes or lesions Neurologic: Grossly normal    Labs on Admission:   Recent Labs  05/12/13 1932  NA 140  K 4.1  CL 103  CO2 27  GLUCOSE 92  BUN 20  CREATININE 1.15*  CALCIUM 9.0    Recent Labs  05/12/13 1932  WBC 5.2  HGB 10.8*  HCT 34.1*  MCV 91.7  PLT 202    Radiological Exams on Admission: Dg Chest 2 View  05/12/2013   CLINICAL DATA:  Shortness of breath, hypertension  EXAM: CHEST  2 VIEW  COMPARISON:  09/10/2012  FINDINGS: Cardiomediastinal  silhouette is stable. Mild degenerative changes thoracic spine. No acute infiltrate or pleural effusion. No pulmonary edema.  IMPRESSION: No active cardiopulmonary disease.   Electronically Signed   By: Natasha Mead M.D.   On: 05/12/2013 20:38   Assessment/Plan  79 yo female with atypical chest pain  Principal Problem:   Chest pain- romi.  Echo in am.  Keep npo after midnight for possible stress testing.  Cardiology has seen and evaluated pt, do not think this is cardiac in etiology.  Will place ntg paste, put on protonix bid.  May need egd in future (told before they would not  do an egd on her due to her age).  Asa.     Active Problems:   HYPERLIPIDEMIA   HYPERTENSION   Atrial fibrillation   Diastolic CHF, chronic    Teller Wakefield A 05/13/2013, 12:14 AM

## 2013-05-13 NOTE — Progress Notes (Signed)
SUBJECTIVE:  No chest pain.  No SOB.     PHYSICAL EXAM Filed Vitals:   05/12/13 2130 05/12/13 2145 05/13/13 0048 05/13/13 0558  BP: 139/66 160/83 138/76 101/60  Pulse: 106 121 100 97  Temp:   98.2 F (36.8 C) 98.1 F (36.7 C)  TempSrc:   Oral Oral  Resp: 28 16  16   Height:   5' (1.524 m)   Weight:   109 lb 9.6 oz (49.714 kg)   SpO2: 100% 96% 100% 100%   General:  No distress Lungs:  Clear Heart:  Irregular Abdomen:  Positive bowel sounds, no rebound no guarding Extremities:  No edema  LABS: Lab Results  Component Value Date   TROPONINI <0.30 05/13/2013   Results for orders placed during the hospital encounter of 05/12/13 (from the past 24 hour(s))  CBC     Status: Abnormal   Collection Time    05/12/13  7:32 PM      Result Value Range   WBC 5.2  4.0 - 10.5 K/uL   RBC 3.72 (*) 3.87 - 5.11 MIL/uL   Hemoglobin 10.8 (*) 12.0 - 15.0 g/dL   HCT 16.1 (*) 09.6 - 04.5 %   MCV 91.7  78.0 - 100.0 fL   MCH 29.0  26.0 - 34.0 pg   MCHC 31.7  30.0 - 36.0 g/dL   RDW 40.9  81.1 - 91.4 %   Platelets 202  150 - 400 K/uL  BASIC METABOLIC PANEL     Status: Abnormal   Collection Time    05/12/13  7:32 PM      Result Value Range   Sodium 140  135 - 145 mEq/L   Potassium 4.1  3.5 - 5.1 mEq/L   Chloride 103  96 - 112 mEq/L   CO2 27  19 - 32 mEq/L   Glucose, Bld 92  70 - 99 mg/dL   BUN 20  6 - 23 mg/dL   Creatinine, Ser 7.82 (*) 0.50 - 1.10 mg/dL   Calcium 9.0  8.4 - 95.6 mg/dL   GFR calc non Af Amer 41 (*) >90 mL/min   GFR calc Af Amer 47 (*) >90 mL/min  PRO B NATRIURETIC PEPTIDE     Status: Abnormal   Collection Time    05/12/13  7:32 PM      Result Value Range   Pro B Natriuretic peptide (BNP) 949.0 (*) 0 - 450 pg/mL  POCT I-STAT TROPONIN I     Status: None   Collection Time    05/12/13  7:38 PM      Result Value Range   Troponin i, poc 0.00  0.00 - 0.08 ng/mL   Comment 3           TROPONIN I     Status: None   Collection Time    05/12/13 11:35 PM      Result Value  Range   Troponin I <0.30  <0.30 ng/mL  GLUCOSE, CAPILLARY     Status: Abnormal   Collection Time    05/13/13 12:52 AM      Result Value Range   Glucose-Capillary 116 (*) 70 - 99 mg/dL   Comment 1 Notify RN    TROPONIN I     Status: None   Collection Time    05/13/13  1:46 AM      Result Value Range   Troponin I <0.30  <0.30 ng/mL  GLUCOSE, CAPILLARY     Status: Abnormal   Collection  Time    05/13/13  7:36 AM      Result Value Range   Glucose-Capillary 102 (*) 70 - 99 mg/dL   Comment 1 Notify RN     No intake or output data in the 24 hours ending 05/13/13 0810   ASSESSMENT AND PLAN:  CHEST PAIN:  Enzymes negative x 2.  No objective evidence of ischemia.  I am not planning any further invasive or noninvasive studies.  No further work up is planned.  ATRIAL FIB:  Rate is controlled.  Continue with current therapy.     Rollene Rotunda 05/13/2013 8:10 AM

## 2013-05-15 NOTE — ED Provider Notes (Signed)
Medical screening examination/treatment/procedure(s) were conducted as a shared visit with non-physician practitioner(s) and myself.  I personally evaluated the patient during the encounter.  EKG Interpretation    Date/Time:  Wednesday May 12 2013 18:53:08 EST Ventricular Rate:  116 PR Interval:    QRS Duration: 85 QT Interval:  336 QTC Calculation: 467 R Axis:   -26 Text Interpretation:  Atrial fibrillation Borderline left axis deviation Anteroseptal infarct, age indeterminate Confirmed by Freida Busman  MD, Tashaun Obey ([id number]) on 05/12/2013 7:03:45 PM             Toy Baker, MD 05/15/13 [phone]

## 2013-05-21 ENCOUNTER — Encounter: Payer: Self-pay | Admitting: Nurse Practitioner

## 2013-05-21 ENCOUNTER — Ambulatory Visit (INDEPENDENT_AMBULATORY_CARE_PROVIDER_SITE_OTHER): Payer: Medicare Other | Admitting: Nurse Practitioner

## 2013-05-21 VITALS — BP 122/66 | HR 94 | Temp 97.1°F | Ht 60.0 in

## 2013-05-21 DIAGNOSIS — J441 Chronic obstructive pulmonary disease with (acute) exacerbation: Secondary | ICD-10-CM

## 2013-05-21 MED ORDER — AZITHROMYCIN 250 MG PO TABS: ORAL_TABLET | ORAL | Status: DC

## 2013-05-21 MED ORDER — BENZONATATE 100 MG PO CAPS: 100.0000 mg | ORAL_CAPSULE | Freq: Two times a day (BID) | ORAL | Status: DC | PRN

## 2013-05-21 NOTE — Progress Notes (Signed)
   Subjective:    Patient ID: Julie Rich, female    DOB: 17-Sep-1921, 79 y.o.   MRN: [id number]  HPI Patient in today with c/o nasal congestion and cough- productive- has changed from clear to thick green.    Review of Systems  Constitutional: Negative for fever, chills and appetite change.  HENT: Positive for congestion, rhinorrhea and sinus pressure. Negative for sore throat and trouble swallowing.   Respiratory: Positive for cough (productive).        Objective:   Physical Exam  Constitutional: She is oriented to person, place, and time. She appears well-developed and well-nourished.  HENT:  Right Ear: Hearing, tympanic membrane, external ear and ear canal normal.  Left Ear: Hearing, tympanic membrane, external ear and ear canal normal.  Nose: Mucosal edema and rhinorrhea present. Right sinus exhibits no maxillary sinus tenderness and no frontal sinus tenderness. Left sinus exhibits no maxillary sinus tenderness and no frontal sinus tenderness.  Mouth/Throat: Uvula is midline, oropharynx is clear and moist and mucous membranes are normal.  Cardiovascular: Normal rate and normal heart sounds.   Pulmonary/Chest: Effort normal and breath sounds normal. She has no wheezes.  Deep dry cough   Abdominal: Soft. Bowel sounds are normal.  Neurological: She is alert and oriented to person, place, and time.  Skin: Skin is warm.    BP 122/66  Pulse 94  Temp(Src) 97.1 F (36.2 C) (Oral)  Ht 5' (1.524 m)       Assessment & Plan:   1. Bronchitis, chronic obstructive, with exacerbation    Meds ordered this encounter  Medications  . azithromycin (ZITHROMAX Z-PAK) 250 MG tablet    Sig: As directed    Dispense:  6 each    Refill:  0    Order Specific Question:  Supervising Provider    Answer:  Ernestina Penna [[id number]]  . benzonatate (TESSALON) 100 MG capsule    Sig: Take 1 capsule (100 mg total) by mouth 2 (two) times daily as needed for cough.    Dispense:  20 capsule    Refill:   0    Order Specific Question:  Supervising Provider    Answer:  Ernestina Penna [[id number]]   1. Take meds as prescribed 2. Use a cool mist humidifier especially during the winter months and when heat has  been humid. 3. Use saline nose sprays frequently 4. Saline irrigations of the nose can be very helpful if done frequently.  * 4X daily for 1 week*  * Use of a nettie pot can be helpful with this. Follow directions with this* 5. Drink plenty of fluids 6. Keep thermostat turn down low 7.For any cough or congestion  Use plain Mucinex- regular strength or max strength is fine   * Children- consult with Pharmacist for dosing 8. For fever or aces or pains- take tylenol or ibuprofen appropriate for age and weight.  * for fevers greater than 101 orally you may alternate ibuprofen and tylenol every  3 hours.   Mary-Margaret Daphine Deutscher, FNP

## 2013-05-21 NOTE — Patient Instructions (Signed)

## 2013-05-25 ENCOUNTER — Telehealth: Payer: Self-pay | Admitting: Nurse Practitioner

## 2013-05-25 NOTE — Telephone Encounter (Signed)
appt with dr,wong at 12:20 on wed 12/31

## 2013-05-26 ENCOUNTER — Ambulatory Visit (INDEPENDENT_AMBULATORY_CARE_PROVIDER_SITE_OTHER): Payer: Medicare Other | Admitting: Family Medicine

## 2013-05-26 ENCOUNTER — Ambulatory Visit (INDEPENDENT_AMBULATORY_CARE_PROVIDER_SITE_OTHER): Payer: Medicare Other

## 2013-05-26 ENCOUNTER — Encounter: Payer: Self-pay | Admitting: Family Medicine

## 2013-05-26 VITALS — BP 133/65 | HR 74 | Temp 97.7°F | Ht 61.0 in | Wt 109.0 lb

## 2013-05-26 DIAGNOSIS — R059 Cough, unspecified: Secondary | ICD-10-CM

## 2013-05-26 DIAGNOSIS — R05 Cough: Secondary | ICD-10-CM

## 2013-05-26 DIAGNOSIS — K219 Gastro-esophageal reflux disease without esophagitis: Secondary | ICD-10-CM

## 2013-05-26 DIAGNOSIS — J449 Chronic obstructive pulmonary disease, unspecified: Secondary | ICD-10-CM

## 2013-05-26 MED ORDER — PANTOPRAZOLE SODIUM 40 MG PO TBEC: 40.0000 mg | DELAYED_RELEASE_TABLET | Freq: Every day | ORAL | Status: DC

## 2013-05-26 MED ORDER — ROFLUMILAST 500 MCG PO TABS: 500.0000 ug | ORAL_TABLET | Freq: Every day | ORAL | Status: DC

## 2013-05-26 NOTE — Patient Instructions (Signed)
Gastroesophageal Reflux Disease, Adult  Gastroesophageal reflux disease (GERD) happens when acid from your stomach flows up into the esophagus. When acid comes in contact with the esophagus, the acid causes soreness (inflammation) in the esophagus. Over time, GERD may create small holes (ulcers) in the lining of the esophagus.  CAUSES   · Increased body weight. This puts pressure on the stomach, making acid rise from the stomach into the esophagus.  · Smoking. This increases acid production in the stomach.  · Drinking alcohol. This causes decreased pressure in the lower esophageal sphincter (valve or ring of muscle between the esophagus and stomach), allowing acid from the stomach into the esophagus.  · Late evening meals and a full stomach. This increases pressure and acid production in the stomach.  · A malformed lower esophageal sphincter.  Sometimes, no cause is found.  SYMPTOMS   · Burning pain in the lower part of the mid-chest behind the breastbone and in the mid-stomach area. This may occur twice a week or more often.  · Trouble swallowing.  · Sore throat.  · Dry cough.  · Asthma-like symptoms including chest tightness, shortness of breath, or wheezing.  DIAGNOSIS   Your caregiver may be able to diagnose GERD based on your symptoms. In some cases, X-rays and other tests may be done to check for complications or to check the condition of your stomach and esophagus.  TREATMENT   Your caregiver may recommend over-the-counter or prescription medicines to help decrease acid production. Ask your caregiver before starting or adding any new medicines.   HOME CARE INSTRUCTIONS   · Change the factors that you can control. Ask your caregiver for guidance concerning weight loss, quitting smoking, and alcohol consumption.  · Avoid foods and drinks that make your symptoms worse, such as:  · Caffeine or alcoholic drinks.  · Chocolate.  · Peppermint or mint flavorings.  · Garlic and onions.  · Spicy foods.  · Citrus fruits,  such as oranges, lemons, or limes.  · Tomato-based foods such as sauce, chili, salsa, and pizza.  · Fried and fatty foods.  · Avoid lying down for the 3 hours prior to your bedtime or prior to taking a nap.  · Eat small, frequent meals instead of large meals.  · Wear loose-fitting clothing. Do not wear anything tight around your waist that causes pressure on your stomach.  · Raise the head of your bed 6 to 8 inches with wood blocks to help you sleep. Extra pillows will not help.  · Only take over-the-counter or prescription medicines for pain, discomfort, or fever as directed by your caregiver.  · Do not take aspirin, ibuprofen, or other nonsteroidal anti-inflammatory drugs (NSAIDs).  SEEK IMMEDIATE MEDICAL CARE IF:   · You have pain in your arms, neck, jaw, teeth, or back.  · Your pain increases or changes in intensity or duration.  · You develop nausea, vomiting, or sweating (diaphoresis).  · You develop shortness of breath, or you faint.  · Your vomit is green, yellow, black, or looks like coffee grounds or blood.  · Your stool is red, bloody, or black.  These symptoms could be signs of other problems, such as heart disease, gastric bleeding, or esophageal bleeding.  MAKE SURE YOU:   · Understand these instructions.  · Will watch your condition.  · Will get help right away if you are not doing well or get worse.  Document Released: 02/20/2005 Document Revised: 08/05/2011 Document Reviewed: 11/30/2010  [employer]® Patient   Information ©2014 [employer].

## 2013-05-26 NOTE — Progress Notes (Signed)
Patient ID: Julie Rich, female   DOB: May 03, 1922, 79 y.o.   MRN: [id number] SUBJECTIVE: CC: Chief Complaint  Patient presents with  . Cough    HPI: Seen recent for cough , bronchitis and COPD, by another provider. See that note. Cough is persisting. No audible wheezes. No chest pain, no pedal edema, no chest pain Also having reflux symptoms whenever she lies back, and the food doesn't feel like it goes down when she lies back.  Past Medical History  Diagnosis Date  . HTN (hypertension)   . A-fib   . Dyslipidemia   . Hypothyroidism   . Stroke   . Asthma   . Shortness of breath   . CHF (congestive heart failure)   . GERD (gastroesophageal reflux disease)   . Anxiety   . COPD (chronic obstructive pulmonary disease)    Past Surgical History  Procedure Laterality Date  . Total abdominal hysterectomy    . Breast surgery      45-50 YEARS AGO LUMP REMOVED  . Appendectomy    . Cardioversion  06/26/2011    Procedure: CARDIOVERSION;  Surgeon: Marca Ancona, MD;  Location: [facility] OR;  Service: Cardiovascular;  Laterality: N/A;  . Tee without cardioversion N/A 07/24/2012    Procedure: TRANSESOPHAGEAL ECHOCARDIOGRAM (TEE);  Surgeon: Pricilla Riffle, MD;  Location: [facility] ENDOSCOPY;  Service: Cardiovascular;  Laterality: N/A;  . Cardioversion N/A 07/24/2012    Procedure: CARDIOVERSION;  Surgeon: Pricilla Riffle, MD;  Location: [facility] ENDOSCOPY;  Service: Cardiovascular;  Laterality: N/A;  . Cardioversion N/A 09/14/2012    Procedure: CARDIOVERSION;  Surgeon: Pricilla Riffle, MD;  Location: [facility] ENDOSCOPY;  Service: Cardiovascular;  Laterality: N/A;   History   Social History  . Marital Status: Divorced    Spouse Name: N/A    Number of Children: N/A  . Years of Education: N/A   Occupational History  . retired    Social History Main Topics  . Smoking status: Never Smoker   . Smokeless tobacco: Never Used     Comment: does not smoke   . Alcohol Use: No  . Drug Use: No  . Sexual Activity: No   Other  Topics Concern  . Not on file   Social History Narrative   Lives alone, has a daughter fairly close by, does not drink a lot of caffeine.    Family History  Problem Relation Age of Onset  . Coronary artery disease Neg Hx   . Stroke Brother   . Breast cancer Mother    Current Outpatient Prescriptions on File Prior to Visit  Medication Sig Dispense Refill  . albuterol (PROAIR HFA) 108 (90 BASE) MCG/ACT inhaler Inhale 2 puffs into the lungs every 6 (six) hours as needed for wheezing or shortness of breath.      Marland Kitchen azithromycin (ZITHROMAX Z-PAK) 250 MG tablet As directed  6 each  0  . benzonatate (TESSALON) 100 MG capsule Take 1 capsule (100 mg total) by mouth 2 (two) times daily as needed for cough.  20 capsule  0  . cholecalciferol (VITAMIN D) 1000 UNITS tablet Take 1,000 Units by mouth daily.      . diazepam (VALIUM) 2 MG tablet Take 1-2 mg if needed for spasms. ( cut tab in half )  20 tablet  0  . diltiazem (CARDIZEM CD) 300 MG 24 hr capsule Take 300 mg by mouth daily.      . Fluticasone-Salmeterol (ADVAIR DISKUS) 250-50 MCG/DOSE AEPB Inhale 2 puffs into the lungs 2 (  two) times daily.      . furosemide (LASIX) 20 MG tablet Take 20 mg by mouth daily.      Marland Kitchen guaiFENesin (MUCINEX) 600 MG 12 hr tablet Take 1,200 mg by mouth every 12 (twelve) hours.      Marland Kitchen levothyroxine (SYNTHROID, LEVOTHROID) 50 MCG tablet Take 50 mcg by mouth daily before breakfast. Take one tablet by mouth every day except 75 mcg on Sunday and Wednesday.      Marland Kitchen LORazepam (ATIVAN) 0.5 MG tablet Take 0.5 mg by mouth 3 (three) times daily as needed for anxiety.      . nitroGLYCERIN (NITROSTAT) 0.4 MG SL tablet Place 1 tablet (0.4 mg total) under the tongue every 5 (five) minutes as needed for chest pain.  30 tablet  3  . OXYGEN-HELIUM IN Inhale into the lungs daily. 2 Liters      . potassium chloride (KLOR-CON) 8 MEQ tablet Take 16 mEq by mouth daily.      . Rivaroxaban (XARELTO) 15 MG TABS tablet Take 15 mg by mouth daily with  supper.       . senna-docusate (SENOKOT-S) 8.6-50 MG per tablet Take 2 tablets by mouth at bedtime.      . temazepam (RESTORIL) 15 MG capsule Take 15 mg by mouth at bedtime.      Marland Kitchen tiotropium (SPIRIVA) 18 MCG inhalation capsule Place 36 mcg into inhaler and inhale daily.       No current facility-administered medications on file prior to visit.   Allergies  Allergen Reactions  . Ciprofloxacin     unknown  . Quinolones     unknown  . Sulfonamide Derivatives     unknown  . Zolpidem Tartrate     Hallucinations   Immunization History  Administered Date(s) Administered  . Influenza Split 01/27/2012  . Influenza Whole 02/26/2010   Prior to Admission medications   Medication Sig Start Date End Date Taking? Authorizing Provider  albuterol (PROAIR HFA) 108 (90 BASE) MCG/ACT inhaler Inhale 2 puffs into the lungs every 6 (six) hours as needed for wheezing or shortness of breath.    Historical Provider, MD  azithromycin (ZITHROMAX Z-PAK) 250 MG tablet As directed 05/21/13   Mary-Margaret Daphine Deutscher, FNP  benzonatate (TESSALON) 100 MG capsule Take 1 capsule (100 mg total) by mouth 2 (two) times daily as needed for cough. 05/21/13   Mary-Margaret Daphine Deutscher, FNP  cholecalciferol (VITAMIN D) 1000 UNITS tablet Take 1,000 Units by mouth daily.    Historical Provider, MD  diazepam (VALIUM) 2 MG tablet Take 1-2 mg if needed for spasms. ( cut tab in half ) 04/16/13   Amy S Esterwood, PA-C  diltiazem (CARDIZEM CD) 300 MG 24 hr capsule Take 300 mg by mouth daily.    Historical Provider, MD  famotidine (PEPCID) 20 MG tablet Take 20 mg by mouth at bedtime.    Historical Provider, MD  Fluticasone-Salmeterol (ADVAIR DISKUS) 250-50 MCG/DOSE AEPB Inhale 2 puffs into the lungs 2 (two) times daily.    Historical Provider, MD  furosemide (LASIX) 20 MG tablet Take 20 mg by mouth daily.    Historical Provider, MD  guaiFENesin (MUCINEX) 600 MG 12 hr tablet Take 1,200 mg by mouth every 12 (twelve) hours.    Historical  Provider, MD  levothyroxine (SYNTHROID, LEVOTHROID) 50 MCG tablet Take 50 mcg by mouth daily before breakfast. Take one tablet by mouth every day except 75 mcg on Sunday and Wednesday.    Historical Provider, MD  LORazepam (ATIVAN) 0.5 MG tablet  Take 0.5 mg by mouth 3 (three) times daily as needed for anxiety.    Historical Provider, MD  nitroGLYCERIN (NITROSTAT) 0.4 MG SL tablet Place 1 tablet (0.4 mg total) under the tongue every 5 (five) minutes as needed for chest pain. 05/13/13   Ripudeep Jenna Luo, MD  omeprazole (PRILOSEC) 20 MG capsule Take 1 capsule (20 mg total) by mouth 2 (two) times daily before a meal. 05/13/13   Ripudeep Jenna Luo, MD  OXYGEN-HELIUM IN Inhale into the lungs daily. 2 Liters    Historical Provider, MD  potassium chloride (KLOR-CON) 8 MEQ tablet Take 16 mEq by mouth daily.    Historical Provider, MD  Rivaroxaban (XARELTO) 15 MG TABS tablet Take 15 mg by mouth daily with supper.  08/24/12   Rollene Rotunda, MD  senna-docusate (SENOKOT-S) 8.6-50 MG per tablet Take 2 tablets by mouth at bedtime. 07/03/12   Kathlen Mody, MD  temazepam (RESTORIL) 15 MG capsule Take 15 mg by mouth at bedtime. 08/03/12   Rhonda G Barrett, PA-C  tiotropium (SPIRIVA) 18 MCG inhalation capsule Place 36 mcg into inhaler and inhale daily.    Historical Provider, MD     ROS: As above in the HPI. All other systems are stable or negative.  OBJECTIVE: APPEARANCE:  Patient in no acute distress.The patient appeared well nourished and normally developed. Acyanotic. Waist: VITAL SIGNS:BP 133/65  Pulse 74  Temp(Src) 97.7 F (36.5 C) (Oral)  Ht 5\' 1"  (1.549 m)  Wt 109 lb (49.442 kg)  BMI 20.61 kg/m2  SpO2 96% WF  SKIN: warm and  Dry without overt rashes, tattoos and scars  HEAD and Neck: without JVD, Head and scalp: normal Eyes:No scleral icterus. Fundi normal, eye movements normal. Ears: Auricle normal, canal normal, Tympanic membranes normal, insufflation normal. Nose: normal Throat: normal Neck &  thyroid: normal  CHEST & LUNGS: Chest wall: normal Lungs: Clear  CVS: Reveals the PMI to be normally located. Regular rhythm, First and Second Heart sounds are normal,  absence of murmurs, rubs or gallops. Peripheral vasculature: Radial pulses: normal Dorsal pedis pulses: normal Posterior pulses: normal  ABDOMEN:  Appearance: normal Benign, no organomegaly, no masses, no Abdominal Aortic enlargement. No Guarding , no rebound. No Bruits. Bowel sounds: normal  RECTAL: N/A GU: N/A  EXTREMETIES: nonedematous.  MUSCULOSKELETAL:  Spine: normal Joints: intact  NEUROLOGIC: oriented to time,place and person; nonfocal. Strength is normal Sensory is normal Reflexes are normal Cranial Nerves are normal.  ASSESSMENT: Cough - Plan: DG Chest 2 View, roflumilast (DALIRESP) 500 MCG TABS tablet  COPD (chronic obstructive pulmonary disease) - Plan: roflumilast (DALIRESP) 500 MCG TABS tablet  GERD (gastroesophageal reflux disease) - Plan: pantoprazole (PROTONIX) 40 MG tablet  PLAN:  Orders Placed This Encounter  Procedures  . DG Chest 2 View    Standing Status: Future     Number of Occurrences: 1     Standing Expiration Date: 07/26/2014    Order Specific Question:  Reason for Exam (SYMPTOM  OR DIAGNOSIS REQUIRED)    Answer:  cough    Order Specific Question:  Preferred imaging location?    Answer:  Internal   [facility] reading (PRIMARY) by  Dr. Modesto Charon: hyperinflation, COPD. No acute findings.                            Handout on GERD and  Discussed with patient.  Meds ordered this encounter  Medications  . roflumilast (DALIRESP) 500 MCG TABS tablet  Sig: Take 1 tablet (500 mcg total) by mouth daily.    Dispense:  30 tablet    Refill:  3  . pantoprazole (PROTONIX) 40 MG tablet    Sig: Take 1 tablet (40 mg total) by mouth daily.    Dispense:  30 tablet    Refill:  3   Medications Discontinued During This Encounter  Medication Reason  . famotidine (PEPCID) 20 MG tablet  Change in therapy  . omeprazole (PRILOSEC) 20 MG capsule Change in therapy   Return in about 4 weeks (around 06/23/2013) for Recheck medical problems.  Maicol Bowland P. Modesto Charon, M.D.

## 2013-05-29 ENCOUNTER — Telehealth: Payer: Self-pay | Admitting: Family Medicine

## 2013-05-29 ENCOUNTER — Other Ambulatory Visit: Payer: Self-pay | Admitting: Family Medicine

## 2013-05-29 MED ORDER — ROFLUMILAST 500 MCG PO TABS: 500.0000 ug | ORAL_TABLET | Freq: Every day | ORAL | Status: DC

## 2013-05-29 NOTE — Telephone Encounter (Signed)
Patient's daughter called requesting a prescription for Daliresp. The previous prescription was called in for 30 to a different pharmacy. Approval for this medication has not been obtained. Family wants to try 7 pills of this medication. Attempt to get approval will be continued. Because the regular drugstore that she normally gets her medications at, was closed, a prescription was called in to the [facility] in the patient's hometown. Once again for only 7 pills.

## 2013-06-30 ENCOUNTER — Encounter: Payer: Self-pay | Admitting: General Practice

## 2013-06-30 ENCOUNTER — Ambulatory Visit (INDEPENDENT_AMBULATORY_CARE_PROVIDER_SITE_OTHER): Payer: Medicare Other | Admitting: General Practice

## 2013-06-30 VITALS — BP 145/69 | HR 91 | Temp 97.4°F | Ht 61.0 in | Wt 108.0 lb

## 2013-06-30 DIAGNOSIS — R059 Cough, unspecified: Secondary | ICD-10-CM

## 2013-06-30 DIAGNOSIS — R05 Cough: Secondary | ICD-10-CM

## 2013-06-30 MED ORDER — PREDNISONE (PAK) 10 MG PO TABS: ORAL_TABLET | ORAL | Status: DC

## 2013-06-30 NOTE — Progress Notes (Signed)
   Subjective:    Patient ID: Julie Rich, female    DOB: 06/09/1921, 80 y.o.   MRN: [id number]  Cough This is a new problem. The current episode started in the past 7 days. The problem has been unchanged. The cough is non-productive. Pertinent negatives include no chest pain, chills, fever, headaches, sore throat or shortness of breath. Nothing aggravates the symptoms. She has tried ipratropium inhaler and a beta-agonist inhaler for the symptoms. Her past medical history is significant for COPD.  Patient daughter verbalized [place] and has been managing COPD, but they were unable to go there today. She reports they have appointment scheduled next week.      Review of Systems  Constitutional: Negative for fever and chills.  HENT: Negative for sore throat.   Respiratory: Positive for cough. Negative for chest tightness and shortness of breath.   Cardiovascular: Negative for chest pain and palpitations.  Neurological: Negative for dizziness and headaches.       Objective:   Physical Exam  Constitutional: She appears well-developed.  Cardiovascular: Normal rate, regular rhythm and normal heart sounds.   Pulmonary/Chest: No respiratory distress. She has wheezes in the right upper field and the left upper field. She exhibits no tenderness.  Diminished breath sounds throughout, oxygen dependent  Neurological: She is alert.  Skin: Skin is warm and dry.  Psychiatric: She has a normal mood and affect.          Assessment & Plan:  1. Cough - predniSONE (STERAPRED UNI-PAK) 10 MG tablet; Take as directed  Dispense: 21 tablet; Refill: 0 -maintain scheduled [place] -may seek emergency medical treatment -Patient and caregiver verbalized understanding Coralie KeensMae E. Bowden Boody, FNP-C

## 2013-06-30 NOTE — Patient Instructions (Signed)

## 2013-07-03 ENCOUNTER — Emergency Department (HOSPITAL_COMMUNITY): Payer: Medicare Other

## 2013-07-03 ENCOUNTER — Inpatient Hospital Stay (HOSPITAL_COMMUNITY)
Admission: EM | Admit: 2013-07-03 | Discharge: 2013-07-08 | DRG: 189 | Disposition: A | Discharge status: Home / Self Care | Payer: Medicare Other | Attending: Internal Medicine | Admitting: Internal Medicine

## 2013-07-03 ENCOUNTER — Encounter (HOSPITAL_COMMUNITY): Payer: Self-pay | Admitting: Emergency Medicine

## 2013-07-03 DIAGNOSIS — Z66 Do not resuscitate: Secondary | ICD-10-CM | POA: Diagnosis present

## 2013-07-03 DIAGNOSIS — F411 Generalized anxiety disorder: Secondary | ICD-10-CM

## 2013-07-03 DIAGNOSIS — Z9981 Dependence on supplemental oxygen: Secondary | ICD-10-CM

## 2013-07-03 DIAGNOSIS — J441 Chronic obstructive pulmonary disease with (acute) exacerbation: Secondary | ICD-10-CM

## 2013-07-03 DIAGNOSIS — I5032 Chronic diastolic (congestive) heart failure: Secondary | ICD-10-CM

## 2013-07-03 DIAGNOSIS — R5381 Other malaise: Secondary | ICD-10-CM

## 2013-07-03 DIAGNOSIS — J440 Chronic obstructive pulmonary disease with acute lower respiratory infection: Secondary | ICD-10-CM | POA: Diagnosis present

## 2013-07-03 DIAGNOSIS — I4892 Unspecified atrial flutter: Secondary | ICD-10-CM

## 2013-07-03 DIAGNOSIS — J45909 Unspecified asthma, uncomplicated: Secondary | ICD-10-CM

## 2013-07-03 DIAGNOSIS — I951 Orthostatic hypotension: Secondary | ICD-10-CM

## 2013-07-03 DIAGNOSIS — J209 Acute bronchitis, unspecified: Secondary | ICD-10-CM | POA: Diagnosis present

## 2013-07-03 DIAGNOSIS — Z7901 Long term (current) use of anticoagulants: Secondary | ICD-10-CM

## 2013-07-03 DIAGNOSIS — J45901 Unspecified asthma with (acute) exacerbation: Secondary | ICD-10-CM

## 2013-07-03 DIAGNOSIS — I10 Essential (primary) hypertension: Secondary | ICD-10-CM

## 2013-07-03 DIAGNOSIS — R059 Cough, unspecified: Secondary | ICD-10-CM

## 2013-07-03 DIAGNOSIS — R0600 Dyspnea, unspecified: Secondary | ICD-10-CM

## 2013-07-03 DIAGNOSIS — Z8673 Personal history of transient ischemic attack (TIA), and cerebral infarction without residual deficits: Secondary | ICD-10-CM

## 2013-07-03 DIAGNOSIS — K219 Gastro-esophageal reflux disease without esophagitis: Secondary | ICD-10-CM | POA: Diagnosis present

## 2013-07-03 DIAGNOSIS — J984 Other disorders of lung: Secondary | ICD-10-CM

## 2013-07-03 DIAGNOSIS — J9620 Acute and chronic respiratory failure, unspecified whether with hypoxia or hypercapnia: Principal | ICD-10-CM | POA: Diagnosis present

## 2013-07-03 DIAGNOSIS — R079 Chest pain, unspecified: Secondary | ICD-10-CM

## 2013-07-03 DIAGNOSIS — R42 Dizziness and giddiness: Secondary | ICD-10-CM

## 2013-07-03 DIAGNOSIS — E039 Hypothyroidism, unspecified: Secondary | ICD-10-CM | POA: Diagnosis present

## 2013-07-03 DIAGNOSIS — I509 Heart failure, unspecified: Secondary | ICD-10-CM | POA: Diagnosis present

## 2013-07-03 DIAGNOSIS — R05 Cough: Secondary | ICD-10-CM

## 2013-07-03 DIAGNOSIS — K7689 Other specified diseases of liver: Secondary | ICD-10-CM

## 2013-07-03 DIAGNOSIS — Z87891 Personal history of nicotine dependence: Secondary | ICD-10-CM

## 2013-07-03 DIAGNOSIS — N390 Urinary tract infection, site not specified: Secondary | ICD-10-CM

## 2013-07-03 DIAGNOSIS — I6350 Cerebral infarction due to unspecified occlusion or stenosis of unspecified cerebral artery: Secondary | ICD-10-CM

## 2013-07-03 DIAGNOSIS — I5031 Acute diastolic (congestive) heart failure: Secondary | ICD-10-CM

## 2013-07-03 DIAGNOSIS — R5383 Other fatigue: Secondary | ICD-10-CM

## 2013-07-03 DIAGNOSIS — E785 Hyperlipidemia, unspecified: Secondary | ICD-10-CM

## 2013-07-03 DIAGNOSIS — I5033 Acute on chronic diastolic (congestive) heart failure: Secondary | ICD-10-CM

## 2013-07-03 DIAGNOSIS — I4891 Unspecified atrial fibrillation: Secondary | ICD-10-CM

## 2013-07-03 HISTORY — DX: Cardiac arrhythmia, unspecified: I49.9

## 2013-07-03 HISTORY — DX: Unspecified osteoarthritis, unspecified site: M19.90

## 2013-07-03 LAB — COMPREHENSIVE METABOLIC PANEL
ALBUMIN: 3.8 g/dL (ref 3.5–5.2)
ALK PHOS: 163 U/L — AB (ref 39–117)
ALT: 13 U/L (ref 0–35)
AST: 13 U/L (ref 0–37)
BUN: 29 mg/dL — ABNORMAL HIGH (ref 6–23)
CHLORIDE: 101 meq/L (ref 96–112)
CO2: 25 mEq/L (ref 19–32)
Calcium: 9.4 mg/dL (ref 8.4–10.5)
Creatinine, Ser: 1.03 mg/dL (ref 0.50–1.10)
GFR calc Af Amer: 54 mL/min — ABNORMAL LOW (ref >90)
GFR calc non Af Amer: 47 mL/min — ABNORMAL LOW (ref >90)
Glucose, Bld: 143 mg/dL — ABNORMAL HIGH (ref 70–99)
POTASSIUM: 4.9 meq/L (ref 3.7–5.3)
Sodium: 139 mEq/L (ref 137–147)
TOTAL PROTEIN: 7.5 g/dL (ref 6.0–8.3)

## 2013-07-03 LAB — CBC WITH DIFFERENTIAL/PLATELET
BASOS ABS: 0 10*3/uL (ref 0.0–0.1)
BASOS PCT: 0 % (ref 0–1)
Eosinophils Absolute: 0 10*3/uL (ref 0.0–0.7)
Eosinophils Relative: 0 % (ref 0–5)
HCT: 37 % (ref 36.0–46.0)
Hemoglobin: 12.3 g/dL (ref 12.0–15.0)
Lymphocytes Relative: 4 % — ABNORMAL LOW (ref 12–46)
Lymphs Abs: 0.4 10*3/uL — ABNORMAL LOW (ref 0.7–4.0)
MCH: 29.8 pg (ref 26.0–34.0)
MCHC: 33.2 g/dL (ref 30.0–36.0)
MCV: 89.6 fL (ref 78.0–100.0)
Monocytes Absolute: 0.5 10*3/uL (ref 0.1–1.0)
Monocytes Relative: 5 % (ref 3–12)
NEUTROS ABS: 8.6 10*3/uL — AB (ref 1.7–7.7)
NEUTROS PCT: 91 % — AB (ref 43–77)
PLATELETS: 282 10*3/uL (ref 150–400)
RBC: 4.13 MIL/uL (ref 3.87–5.11)
RDW: 15 % (ref 11.5–15.5)
WBC: 9.4 10*3/uL (ref 4.0–10.5)

## 2013-07-03 LAB — PRO B NATRIURETIC PEPTIDE: PRO B NATRI PEPTIDE: 1877 pg/mL — AB (ref 0–450)

## 2013-07-03 LAB — TROPONIN I

## 2013-07-03 MED ORDER — DILTIAZEM HCL 25 MG/5ML IV SOLN
10.0000 mg | Freq: Once | INTRAVENOUS | Status: AC
  Administered 2013-07-03: 10 mg via INTRAVENOUS
  Filled 2013-07-03: qty 5

## 2013-07-03 MED ORDER — DILTIAZEM HCL 100 MG IV SOLR
5.0000 mg/h | INTRAVENOUS | Status: DC
  Administered 2013-07-04: 10 mg/h via INTRAVENOUS
  Administered 2013-07-04: 5 mg/h via INTRAVENOUS
  Administered 2013-07-04: 15 mg/h via INTRAVENOUS

## 2013-07-03 MED ORDER — NITROGLYCERIN 0.4 MG SL SUBL
0.4000 mg | SUBLINGUAL_TABLET | Freq: Once | SUBLINGUAL | Status: AC
  Administered 2013-07-03: 0.4 mg via SUBLINGUAL
  Filled 2013-07-03: qty 25

## 2013-07-03 MED ORDER — ASPIRIN 325 MG PO TABS
325.0000 mg | ORAL_TABLET | Freq: Once | ORAL | Status: DC
  Filled 2013-07-03: qty 1

## 2013-07-03 MED ORDER — NITROGLYCERIN 0.4 MG SL SUBL
0.4000 mg | SUBLINGUAL_TABLET | SUBLINGUAL | Status: DC | PRN
  Administered 2013-07-06 (×2): 0.4 mg via SUBLINGUAL
  Filled 2013-07-03: qty 25

## 2013-07-03 MED ORDER — DILTIAZEM LOAD VIA INFUSION
10.0000 mg | Freq: Once | INTRAVENOUS | Status: AC
  Filled 2013-07-03: qty 10

## 2013-07-03 NOTE — ED Notes (Signed)
Pt back from x-ray.

## 2013-07-03 NOTE — ED Provider Notes (Signed)
CSN: [id number]     Arrival date & time 07/03/13  2117 History   First MD Initiated Contact with Patient 07/03/13 2158     Chief Complaint  Patient presents with  . Shortness of Breath   (Consider location/radiation/quality/duration/timing/severity/associated sxs/prior Treatment) HPI Comments: Pt presents for wheezing which started today, but denies SOB in my history taking. She has chronic unchanged productive cough.  No fever, CP, leg swelling, n/v, d/a.  No sick contacts.  She has been compliant w/ meds.   Patient is a 80 y.o. female presenting with wheezing. The history is provided by the patient. No language interpreter was used.  Wheezing Severity:  Moderate Severity compared to prior episodes:  Similar Onset quality:  Unable to specify Duration:  1 day Timing:  Intermittent Progression:  Waxing and waning Chronicity:  Recurrent Relieved by:  Nothing Worsened by:  Nothing tried Ineffective treatments:  None tried Associated symptoms: cough and sputum production   Associated symptoms: no chest pain, no chest tightness, no fatigue, no fever, no foot swelling, no headaches, no rhinorrhea, no shortness of breath and no sore throat   Cough:    Cough characteristics:  Productive (chronic, unchanged)   Sputum characteristics:  Manson PasseyBrown   Severity:  Moderate   Progression:  Unchanged   Chronicity:  Chronic   Past Medical History  Diagnosis Date  . HTN (hypertension)   . A-fib   . Dyslipidemia   . Hypothyroidism   . Stroke   . Asthma   . Shortness of breath   . CHF (congestive heart failure)   . GERD (gastroesophageal reflux disease)   . Anxiety   . COPD (chronic obstructive pulmonary disease)    Past Surgical History  Procedure Laterality Date  . Total abdominal hysterectomy    . Breast surgery      45-50 YEARS AGO LUMP REMOVED  . Appendectomy    . Cardioversion  06/26/2011    Procedure: CARDIOVERSION;  Surgeon: Marca Anconaalton McLean, MD;  [facility];  Service:  Cardiovascular;  Laterality: N/A;  . Tee without cardioversion N/A 07/24/2012    Procedure: TRANSESOPHAGEAL ECHOCARDIOGRAM (TEE);  Surgeon: Pricilla RifflePaula V Ross, MD;  [facility];  Service: Cardiovascular;  Laterality: N/A;  . Cardioversion N/A 07/24/2012    Procedure: CARDIOVERSION;  Surgeon: Pricilla RifflePaula V Ross, MD;  [facility];  Service: Cardiovascular;  Laterality: N/A;  . Cardioversion N/A 09/14/2012    Procedure: CARDIOVERSION;  Surgeon: Pricilla RifflePaula V Ross, MD;  [facility];  Service: Cardiovascular;  Laterality: N/A;   Family History  Problem Relation Age of Onset  . Coronary artery disease Neg Hx   . Stroke Brother   . Breast cancer Mother    History  Substance Use Topics  . Smoking status: Never Smoker   . Smokeless tobacco: Never Used     Comment: does not smoke   . Alcohol Use: No   OB History   Grav Para Term Preterm Abortions TAB SAB Ect Mult Living                 Review of Systems  Constitutional: Negative for fever, chills, diaphoresis, activity change, appetite change and fatigue.  HENT: Negative for congestion, facial swelling, rhinorrhea and sore throat.   Eyes: Negative for photophobia and discharge.  Respiratory: Positive for cough, sputum production and wheezing. Negative for chest tightness and shortness of breath.   Cardiovascular: Negative for chest pain, palpitations and leg swelling.  Gastrointestinal: Negative for nausea, vomiting, abdominal pain and diarrhea.  Endocrine: Negative for polydipsia and polyuria.  Genitourinary: Negative for dysuria, frequency, difficulty urinating and pelvic pain.  Musculoskeletal: Negative for arthralgias, back pain, neck pain and neck stiffness.  Skin: Negative for color change and wound.  Allergic/Immunologic: Negative for immunocompromised state.  Neurological: Negative for facial asymmetry, weakness, numbness and headaches.  Hematological: Does not bruise/bleed easily.  Psychiatric/Behavioral: Negative  for confusion and agitation.    Allergies  Ciprofloxacin; Quinolones; Sulfonamide derivatives; and Zolpidem tartrate  Home Medications   No current outpatient prescriptions on file. BP 164/58  Pulse 92  Temp(Src) 98.1 F (36.7 C) (Oral)  Resp 29  Ht 5' (1.524 m)  Wt 103 lb 9.9 oz (47 kg)  BMI 20.24 kg/m2  SpO2 94% Physical Exam  Constitutional: She is oriented to person, place, and time. She appears well-developed and well-nourished. No distress.  HENT:  Head: Normocephalic and atraumatic.  Mouth/Throat: No oropharyngeal exudate.  Eyes: Pupils are equal, round, and reactive to light.  Neck: Normal range of motion. Neck supple.  Cardiovascular: Normal rate, regular rhythm and normal heart sounds.  Exam reveals no gallop and no friction rub.   No murmur heard. Pulmonary/Chest: Effort normal and breath sounds normal. No respiratory distress. She has no wheezes. She has no rales.  Abdominal: Soft. Bowel sounds are normal. She exhibits no distension and no mass. There is no tenderness. There is no rebound and no guarding.  Musculoskeletal: Normal range of motion. She exhibits no edema and no tenderness.  Neurological: She is alert and oriented to person, place, and time.  Skin: Skin is warm and dry.  Psychiatric: She has a normal mood and affect.    ED Course  Procedures (including critical care time) Labs Review Labs Reviewed  CBC WITH DIFFERENTIAL - Abnormal; Notable for the following:    Neutrophils Relative % 91 (*)    Neutro Abs 8.6 (*)    Lymphocytes Relative 4 (*)    Lymphs Abs 0.4 (*)    All other components within normal limits  COMPREHENSIVE METABOLIC PANEL - Abnormal; Notable for the following:    Glucose, Bld 143 (*)    BUN 29 (*)    Alkaline Phosphatase 163 (*)    Total Bilirubin <0.2 (*)    GFR calc non Af Amer 47 (*)    GFR calc Af Amer 54 (*)    All other components within normal limits  PRO B NATRIURETIC PEPTIDE - Abnormal; Notable for the following:     Pro B Natriuretic peptide (BNP) 1877.0 (*)    All other components within normal limits  BASIC METABOLIC PANEL - Abnormal; Notable for the following:    Glucose, Bld 202 (*)    BUN 30 (*)    GFR calc non Af Amer 47 (*)    GFR calc Af Amer 54 (*)    All other components within normal limits  MRSA PCR SCREENING  TROPONIN I  CBC  MAGNESIUM  TROPONIN I  TROPONIN I  TROPONIN I  TSH   Imaging Review Dg Chest 2 View  07/03/2013   CLINICAL DATA:  80 year old with shortness of breath.  EXAM: CHEST  2 VIEW  COMPARISON:  06/25/2012 and prior chest radiographs  FINDINGS: Cardiomegaly and hyperinflation again noted.  There is no evidence of focal airspace disease, pulmonary edema, suspicious pulmonary nodule/mass, pleural effusion, or pneumothorax.  No acute bony abnormalities are identified.  IMPRESSION: Cardiomegaly without evidence of acute cardiopulmonary disease.   Electronically Signed   By: Dolores Frame.D.  On: 07/03/2013 23:07     Date: 07/04/2013  Rate: 131  Rhythm: atrial fibrillation  QRS Axis: indeterminate  Intervals: indeterminate  ST/T Wave abnormalities: nonspecific ST/T changes  Conduction Disutrbances:none  Narrative Interpretation:   Old EKG Reviewed: changes noted, increased rate    MDM   1. Atrial fibrillation with RVR   2. Atrial fibrillation   3. Chest pain   4. COPD exacerbation   5. Dyspnea   6. Acute on chronic diastolic CHF (congestive heart failure), NYHA class 4   7. Atrial flutter   8. Chronic anticoagulation   9. Diastolic CHF, chronic    Pt is a 80 y.o. female with Pmhx as above who presents with report of wheezing. Pt poor historian.  She denies SOB, but states she was wheezing at home & thought she should "have it checked out".  She has had cough "for forever".  Denies fever, inc leg pain/swelling, n/v, d/a.  She initially denies CP, but in repeat exam had episodes of dull CP which resolved w/ 1 SL NTG.  Trop not elevaed, BNP mildly elevated, CXR w/  cardiomegaly but no overt edema. On PE, pt in afib w/ RVR with rate 130-160s.  Lungs clear, no significant LE edema.  HR improved briefly with bolus of 10mg  IV dilt, but quickly returned to >130's.  Dilt gtt started and titrated up to 10 with HR in 110-120's. BP remained stable.  Pt admitted to step down under [facility].  CRITICAL CARE Performed by: Toy Cookey, E Total critical care time: 30 Critical care time was exclusive of separately billable procedures and treating other patients. Critical care was necessary to treat or prevent imminent or life-threatening deterioration. Critical care was time spent personally by me on the following activities: development of treatment plan with patient and/or surrogate as well as nursing, discussions with consultants, evaluation of patient's response to treatment, examination of patient, obtaining history from patient or surrogate, ordering and performing treatments and interventions, ordering and review of laboratory studies, ordering and review of radiographic studies, pulse oximetry, titrated of continuous drip, and re-evaluation of patient's condition.         Shanna Cisco, MD 07/04/13 [id number]

## 2013-07-03 NOTE — ED Notes (Signed)
Pt arrives via EMS from home. Pt states she has had SOB all day. Hx COPD, afib (afib w RVR on EMS arrival). Pt felt like she was wheezing. EMS reports clear lung sounds bilat. Prod cough present and generalized weakness x 2 days. Pt wears 2L [place] at home. Arrives on 4L [place]. HR 107-150. Pt takes cardizem. 22 RH.

## 2013-07-03 NOTE — ED Notes (Signed)
Pt taken to xray 

## 2013-07-04 DIAGNOSIS — J441 Chronic obstructive pulmonary disease with (acute) exacerbation: Secondary | ICD-10-CM

## 2013-07-04 DIAGNOSIS — I4892 Unspecified atrial flutter: Secondary | ICD-10-CM

## 2013-07-04 DIAGNOSIS — R0989 Other specified symptoms and signs involving the circulatory and respiratory systems: Secondary | ICD-10-CM

## 2013-07-04 DIAGNOSIS — I4891 Unspecified atrial fibrillation: Secondary | ICD-10-CM

## 2013-07-04 DIAGNOSIS — R079 Chest pain, unspecified: Secondary | ICD-10-CM

## 2013-07-04 DIAGNOSIS — R0609 Other forms of dyspnea: Secondary | ICD-10-CM

## 2013-07-04 LAB — BASIC METABOLIC PANEL
BUN: 30 mg/dL — ABNORMAL HIGH (ref 6–23)
CALCIUM: 9.1 mg/dL (ref 8.4–10.5)
CHLORIDE: 98 meq/L (ref 96–112)
CO2: 25 meq/L (ref 19–32)
Creatinine, Ser: 1.03 mg/dL (ref 0.50–1.10)
GFR calc Af Amer: 54 mL/min — ABNORMAL LOW (ref >90)
GFR calc non Af Amer: 47 mL/min — ABNORMAL LOW (ref >90)
Glucose, Bld: 202 mg/dL — ABNORMAL HIGH (ref 70–99)
Potassium: 4.5 mEq/L (ref 3.7–5.3)
Sodium: 137 mEq/L (ref 137–147)

## 2013-07-04 LAB — TROPONIN I: Troponin I: <0.3 ng/mL (ref <0.30)

## 2013-07-04 LAB — MAGNESIUM: Magnesium: 2.3 mg/dL (ref 1.5–2.5)

## 2013-07-04 LAB — CBC
HEMATOCRIT: 38.2 % (ref 36.0–46.0)
Hemoglobin: 12.4 g/dL (ref 12.0–15.0)
MCH: 29.5 pg (ref 26.0–34.0)
MCHC: 32.5 g/dL (ref 30.0–36.0)
MCV: 91 fL (ref 78.0–100.0)
Platelets: 300 10*3/uL (ref 150–400)
RBC: 4.2 MIL/uL (ref 3.87–5.11)
RDW: 15 % (ref 11.5–15.5)
WBC: 8.6 10*3/uL (ref 4.0–10.5)

## 2013-07-04 LAB — MRSA PCR SCREENING: MRSA BY PCR: NEGATIVE

## 2013-07-04 MED ORDER — LORAZEPAM 0.5 MG PO TABS
0.5000 mg | ORAL_TABLET | Freq: Three times a day (TID) | ORAL | Status: DC
  Administered 2013-07-04 – 2013-07-06 (×7): 0.5 mg via ORAL
  Filled 2013-07-04 (×7): qty 1

## 2013-07-04 MED ORDER — FAMOTIDINE 20 MG PO TABS
20.0000 mg | ORAL_TABLET | Freq: Every day | ORAL | Status: DC
  Administered 2013-07-04: 20 mg via ORAL
  Filled 2013-07-04 (×2): qty 1

## 2013-07-04 MED ORDER — ONDANSETRON HCL 4 MG/2ML IJ SOLN
4.0000 mg | Freq: Four times a day (QID) | INTRAMUSCULAR | Status: DC | PRN
  Administered 2013-07-04: 4 mg via INTRAVENOUS
  Filled 2013-07-04: qty 2

## 2013-07-04 MED ORDER — LEVALBUTEROL HCL 0.63 MG/3ML IN NEBU
0.6300 mg | INHALATION_SOLUTION | Freq: Four times a day (QID) | RESPIRATORY_TRACT | Status: DC
  Administered 2013-07-04 (×2): 0.63 mg via RESPIRATORY_TRACT
  Filled 2013-07-04 (×6): qty 3

## 2013-07-04 MED ORDER — DILTIAZEM HCL ER COATED BEADS 360 MG PO CP24
360.0000 mg | ORAL_CAPSULE | Freq: Every day | ORAL | Status: DC
  Administered 2013-07-04 – 2013-07-08 (×5): 360 mg via ORAL
  Filled 2013-07-04 (×5): qty 1

## 2013-07-04 MED ORDER — MOMETASONE FURO-FORMOTEROL FUM 100-5 MCG/ACT IN AERO
2.0000 | INHALATION_SPRAY | Freq: Two times a day (BID) | RESPIRATORY_TRACT | Status: DC
  Administered 2013-07-04 – 2013-07-08 (×9): 2 via RESPIRATORY_TRACT
  Filled 2013-07-04: qty 8.8

## 2013-07-04 MED ORDER — ONDANSETRON HCL 4 MG/2ML IJ SOLN: 4.0000 mg | Freq: Three times a day (TID) | INTRAMUSCULAR | Status: DC | PRN

## 2013-07-04 MED ORDER — SODIUM CHLORIDE 0.9 % IJ SOLN
3.0000 mL | Freq: Two times a day (BID) | INTRAMUSCULAR | Status: DC
  Administered 2013-07-04 – 2013-07-08 (×9): 3 mL via INTRAVENOUS

## 2013-07-04 MED ORDER — DEXTROSE 5 % IV SOLN
1.0000 g | INTRAVENOUS | Status: DC
  Administered 2013-07-04 – 2013-07-07 (×4): 1 g via INTRAVENOUS
  Filled 2013-07-04 (×4): qty 10

## 2013-07-04 MED ORDER — BISACODYL 10 MG RE SUPP: 10.0000 mg | Freq: Every day | RECTAL | Status: DC | PRN

## 2013-07-04 MED ORDER — FUROSEMIDE 20 MG PO TABS
20.0000 mg | ORAL_TABLET | Freq: Every day | ORAL | Status: DC
  Administered 2013-07-04 – 2013-07-08 (×5): 20 mg via ORAL
  Filled 2013-07-04 (×5): qty 1

## 2013-07-04 MED ORDER — DILTIAZEM HCL 90 MG PO TABS
90.0000 mg | ORAL_TABLET | Freq: Four times a day (QID) | ORAL | Status: DC
  Filled 2013-07-04 (×5): qty 1

## 2013-07-04 MED ORDER — ALUM & MAG HYDROXIDE-SIMETH 200-200-20 MG/5ML PO SUSP: 30.0000 mL | Freq: Four times a day (QID) | ORAL | Status: DC | PRN

## 2013-07-04 MED ORDER — RIVAROXABAN 15 MG PO TABS
15.0000 mg | ORAL_TABLET | Freq: Every day | ORAL | Status: DC
  Administered 2013-07-04 – 2013-07-07 (×4): 15 mg via ORAL
  Filled 2013-07-04 (×5): qty 1

## 2013-07-04 MED ORDER — GUAIFENESIN ER 600 MG PO TB12
1200.0000 mg | ORAL_TABLET | Freq: Two times a day (BID) | ORAL | Status: DC
  Filled 2013-07-04 (×2): qty 2

## 2013-07-04 MED ORDER — SENNOSIDES-DOCUSATE SODIUM 8.6-50 MG PO TABS
2.0000 | ORAL_TABLET | Freq: Every day | ORAL | Status: DC
  Administered 2013-07-04 – 2013-07-07 (×4): 2 via ORAL
  Filled 2013-07-04 (×5): qty 2

## 2013-07-04 MED ORDER — SODIUM CHLORIDE 0.9 % IV SOLN: INTRAVENOUS | Status: AC

## 2013-07-04 MED ORDER — DILTIAZEM HCL ER COATED BEADS 300 MG PO CP24
300.0000 mg | ORAL_CAPSULE | Freq: Every day | ORAL | Status: DC
  Filled 2013-07-04: qty 1

## 2013-07-04 MED ORDER — LEVOTHYROXINE SODIUM 50 MCG PO TABS: 50.0000 ug | ORAL_TABLET | Freq: Every day | ORAL | Status: DC

## 2013-07-04 MED ORDER — AZITHROMYCIN 250 MG PO TABS
250.0000 mg | ORAL_TABLET | Freq: Every day | ORAL | Status: AC
  Administered 2013-07-05 – 2013-07-08 (×4): 250 mg via ORAL
  Filled 2013-07-04 (×5): qty 1

## 2013-07-04 MED ORDER — LORAZEPAM 1 MG PO TABS
1.0000 mg | ORAL_TABLET | ORAL | Status: DC | PRN
  Administered 2013-07-04 – 2013-07-07 (×2): 1 mg via ORAL
  Filled 2013-07-04: qty 1
  Filled 2013-07-04: qty 2

## 2013-07-04 MED ORDER — LEVALBUTEROL HCL 0.63 MG/3ML IN NEBU
0.6300 mg | INHALATION_SOLUTION | RESPIRATORY_TRACT | Status: DC | PRN
  Administered 2013-07-04 (×2): 0.63 mg via RESPIRATORY_TRACT
  Filled 2013-07-04 (×2): qty 3

## 2013-07-04 MED ORDER — DM-GUAIFENESIN ER 30-600 MG PO TB12
1.0000 | ORAL_TABLET | Freq: Two times a day (BID) | ORAL | Status: DC
  Administered 2013-07-04 – 2013-07-06 (×5): 1 via ORAL
  Filled 2013-07-04 (×8): qty 1

## 2013-07-04 MED ORDER — AZITHROMYCIN 500 MG PO TABS
500.0000 mg | ORAL_TABLET | Freq: Every day | ORAL | Status: AC
  Administered 2013-07-04: 500 mg via ORAL
  Filled 2013-07-04: qty 1

## 2013-07-04 MED ORDER — POTASSIUM CHLORIDE ER 8 MEQ PO TBCR
16.0000 meq | EXTENDED_RELEASE_TABLET | Freq: Every day | ORAL | Status: DC
  Administered 2013-07-04 – 2013-07-08 (×5): 16 meq via ORAL
  Filled 2013-07-04 (×7): qty 2

## 2013-07-04 MED ORDER — PANTOPRAZOLE SODIUM 40 MG PO TBEC
40.0000 mg | DELAYED_RELEASE_TABLET | Freq: Every day | ORAL | Status: DC
  Administered 2013-07-04 – 2013-07-08 (×5): 40 mg via ORAL
  Filled 2013-07-04 (×5): qty 1

## 2013-07-04 MED ORDER — LEVOTHYROXINE SODIUM 75 MCG PO TABS
75.0000 ug | ORAL_TABLET | ORAL | Status: DC
  Administered 2013-07-04 – 2013-07-07 (×2): 75 ug via ORAL
  Filled 2013-07-04 (×3): qty 1

## 2013-07-04 MED ORDER — METHYLPREDNISOLONE SODIUM SUCC 125 MG IJ SOLR
60.0000 mg | Freq: Three times a day (TID) | INTRAMUSCULAR | Status: DC
  Administered 2013-07-04 – 2013-07-05 (×4): 60 mg via INTRAVENOUS
  Filled 2013-07-04 (×2): qty 0.96
  Filled 2013-07-04 (×2): qty 2
  Filled 2013-07-04 (×4): qty 0.96

## 2013-07-04 MED ORDER — LEVALBUTEROL HCL 0.63 MG/3ML IN NEBU
0.6300 mg | INHALATION_SOLUTION | Freq: Two times a day (BID) | RESPIRATORY_TRACT | Status: DC
  Administered 2013-07-04 – 2013-07-08 (×8): 0.63 mg via RESPIRATORY_TRACT
  Filled 2013-07-04 (×15): qty 3

## 2013-07-04 MED ORDER — TIOTROPIUM BROMIDE MONOHYDRATE 18 MCG IN CAPS
18.0000 ug | ORAL_CAPSULE | Freq: Every day | RESPIRATORY_TRACT | Status: DC
  Filled 2013-07-04: qty 5

## 2013-07-04 MED ORDER — VITAMIN D3 25 MCG (1000 UNIT) PO TABS
1000.0000 [IU] | ORAL_TABLET | Freq: Every day | ORAL | Status: DC
  Administered 2013-07-04 – 2013-07-08 (×5): 1000 [IU] via ORAL
  Filled 2013-07-04 (×6): qty 1

## 2013-07-04 MED ORDER — SENNOSIDES-DOCUSATE SODIUM 8.6-50 MG PO TABS
1.0000 | ORAL_TABLET | Freq: Every evening | ORAL | Status: DC | PRN
  Administered 2013-07-06: 1 via ORAL
  Filled 2013-07-04: qty 2
  Filled 2013-07-04 (×2): qty 1

## 2013-07-04 MED ORDER — ACETAMINOPHEN 325 MG PO TABS: 650.0000 mg | ORAL_TABLET | Freq: Four times a day (QID) | ORAL | Status: DC | PRN

## 2013-07-04 MED ORDER — ACETAMINOPHEN 650 MG RE SUPP: 650.0000 mg | Freq: Four times a day (QID) | RECTAL | Status: DC | PRN

## 2013-07-04 MED ORDER — TEMAZEPAM 15 MG PO CAPS
15.0000 mg | ORAL_CAPSULE | Freq: Every day | ORAL | Status: DC
  Administered 2013-07-04 – 2013-07-07 (×5): 15 mg via ORAL
  Filled 2013-07-04 (×5): qty 1

## 2013-07-04 MED ORDER — IPRATROPIUM BROMIDE 0.02 % IN SOLN
0.5000 mg | Freq: Four times a day (QID) | RESPIRATORY_TRACT | Status: DC
  Administered 2013-07-04 (×2): 0.5 mg via RESPIRATORY_TRACT
  Filled 2013-07-04 (×2): qty 2.5

## 2013-07-04 MED ORDER — LORAZEPAM 1 MG PO TABS
0.5000 mg | ORAL_TABLET | Freq: Once | ORAL | Status: AC
  Administered 2013-07-04: 0.5 mg via ORAL
  Filled 2013-07-04: qty 1

## 2013-07-04 MED ORDER — IPRATROPIUM BROMIDE 0.02 % IN SOLN
0.5000 mg | Freq: Two times a day (BID) | RESPIRATORY_TRACT | Status: DC
  Administered 2013-07-04 – 2013-07-08 (×8): 0.5 mg via RESPIRATORY_TRACT
  Filled 2013-07-04 (×7): qty 2.5

## 2013-07-04 MED ORDER — DILTIAZEM HCL ER COATED BEADS 360 MG PO CP24
360.0000 mg | ORAL_CAPSULE | Freq: Every day | ORAL | Status: DC
  Filled 2013-07-04: qty 1

## 2013-07-04 MED ORDER — ALBUTEROL SULFATE (2.5 MG/3ML) 0.083% IN NEBU: 2.5000 mg | INHALATION_SOLUTION | RESPIRATORY_TRACT | Status: DC | PRN

## 2013-07-04 MED ORDER — LEVOTHYROXINE SODIUM 50 MCG PO TABS
50.0000 ug | ORAL_TABLET | ORAL | Status: DC
  Administered 2013-07-05 – 2013-07-08 (×3): 50 ug via ORAL
  Filled 2013-07-04 (×5): qty 1

## 2013-07-04 MED ORDER — ONDANSETRON HCL 4 MG PO TABS
4.0000 mg | ORAL_TABLET | Freq: Four times a day (QID) | ORAL | Status: DC | PRN
  Filled 2013-07-04: qty 1

## 2013-07-04 NOTE — H&P (Signed)
Patient Demographics  Julie Rich, is a 80 y.o. female  MRN: [id number]   DOB - 1922-01-30  Admit Date - 07/03/2013  Outpatient Primary MD for the patient is Rudi Heap, MD   With History of -  Past Medical History  Diagnosis Date  . HTN (hypertension)   . A-fib   . Dyslipidemia   . Hypothyroidism   . Stroke   . Asthma   . Shortness of breath   . CHF (congestive heart failure)   . GERD (gastroesophageal reflux disease)   . Anxiety   . COPD (chronic obstructive pulmonary disease)       Past Surgical History  Procedure Laterality Date  . Total abdominal hysterectomy    . Breast surgery      45-50 YEARS AGO LUMP REMOVED  . Appendectomy    . Cardioversion  06/26/2011    Procedure: CARDIOVERSION;  Surgeon: Marca Ancona, MD;  Location: [facility] OR;  Service: Cardiovascular;  Laterality: N/A;  . Tee without cardioversion N/A 07/24/2012    Procedure: TRANSESOPHAGEAL ECHOCARDIOGRAM (TEE);  Surgeon: Pricilla Riffle, MD;  Location: [facility] ENDOSCOPY;  Service: Cardiovascular;  Laterality: N/A;  . Cardioversion N/A 07/24/2012    Procedure: CARDIOVERSION;  Surgeon: Pricilla Riffle, MD;  Location: [facility] ENDOSCOPY;  Service: Cardiovascular;  Laterality: N/A;  . Cardioversion N/A 09/14/2012    Procedure: CARDIOVERSION;  Surgeon: Pricilla Riffle, MD;  Location: [facility] ENDOSCOPY;  Service: Cardiovascular;  Laterality: N/A;    in for   Chief Complaint  Patient presents with  . Shortness of Breath     HPI  Julie Rich  is a 80 y.o. female, with known history of  atrial fibrillation, on Xarelto, usually controlled with by mouth Cardizem, as well known history of COPD, on 2 L nasal cannula at baseline, patient reports shortness of breath over the last couple of weeks, has been prescribed 2 courses of by mouth prednisone, patient presents with  shortness of breath and wheezing today, patient was noticed to be in A. fib with RVR with heart rate in the 140s, with transient improvement after IV Cardizem push, currently on Cardizem drip, has cough with clear phlegm, chest x-ray does not show any opacity or infiltrate or volume overload, patient complains of brief episode of chest pain, currently resolved, her troponins were negative, hospitalist requested to admit   Review of Systems    In addition to the HPI above,  No Fever-chills, No Headache, No changes with Vision or hearing, No problems swallowing food or Liquids, Brief episode of Chest pain, complaints of Cough and Shortness of Breath, No Abdominal pain, No Nausea or Vommitting, Bowel movements are regular, No Blood in stool or Urine, No dysuria, No new skin rashes or bruises, No new joints pains-aches,  No new weakness, tingling, numbness in any extremity, but complains of generalized weakness No recent weight gain or loss, No polyuria, polydypsia or polyphagia, No significant Mental Stressors.  A full 10  point Review of Systems was done, except as stated above, all other Review of Systems were negative.   Social History History  Substance Use Topics  . Smoking status: Never Smoker   . Smokeless tobacco: Never Used     Comment: does not smoke   . Alcohol Use: No     Family History Family History  Problem Relation Age of Onset  . Coronary artery disease Neg Hx   . Stroke Brother   . Breast cancer Mother      Prior to Admission medications   Medication Sig Start Date End Date Taking? Authorizing Provider  albuterol (PROAIR HFA) 108 (90 BASE) MCG/ACT inhaler Inhale 2 puffs into the lungs every 6 (six) hours as needed for wheezing or shortness of breath.   Yes Historical Provider, MD  cholecalciferol (VITAMIN D) 1000 UNITS tablet Take 1,000 Units by mouth daily.   Yes Historical Provider, MD  diltiazem (CARDIZEM CD) 300 MG 24 hr capsule Take 300 mg by mouth  daily.   Yes Historical Provider, MD  famotidine (PEPCID) 20 MG tablet Take 1 tablet by mouth daily. 06/07/13  Yes Historical Provider, MD  Fluticasone-Salmeterol (ADVAIR DISKUS) 250-50 MCG/DOSE AEPB Inhale 2 puffs into the lungs 2 (two) times daily.   Yes Historical Provider, MD  furosemide (LASIX) 20 MG tablet Take 20 mg by mouth daily.   Yes Historical Provider, MD  guaiFENesin (MUCINEX) 600 MG 12 hr tablet Take 1,200 mg by mouth every 12 (twelve) hours.   Yes Historical Provider, MD  levothyroxine (SYNTHROID, LEVOTHROID) 50 MCG tablet Take 50-75 mcg by mouth daily before breakfast. Take one tablet ( ) by mouth every day except Sunday and Wednesday, take 1 1/2 tablets ( )   Yes Historical Provider, MD  LORazepam (ATIVAN) 0.5 MG tablet Take 0.5 mg by mouth 3 (three) times daily. Scheduled doses to prevent panic/fear episodes   Yes Historical Provider, MD  omeprazole (PRILOSEC) 20 MG capsule Take 20 mg by mouth daily.   Yes Historical Provider, MD  potassium chloride (KLOR-CON) 8 MEQ tablet Take 16 mEq by mouth daily.   Yes Historical Provider, MD  predniSONE (STERAPRED UNI-PAK) 10 MG tablet Take as directed 06/30/13  Yes Coralie Keens, FNP  Rivaroxaban (XARELTO) 15 MG TABS tablet Take 15 mg by mouth daily with supper.  08/24/12  Yes Rollene Rotunda, MD  senna-docusate (SENOKOT-S) 8.6-50 MG per tablet Take 2 tablets by mouth at bedtime. 07/03/12  Yes Kathlen Mody, MD  temazepam (RESTORIL) 15 MG capsule Take 15 mg by mouth at bedtime. 08/03/12  Yes Rhonda G Barrett, PA-C  tiotropium (SPIRIVA) 18 MCG inhalation capsule Place 18 mcg into inhaler and inhale daily. Inhale 2 puffs from 1 capsule   Yes Historical Provider, MD  OXYGEN-HELIUM IN Inhale into the lungs daily. 2 Liters    Historical Provider, MD    Allergies  Allergen Reactions  . Ciprofloxacin Other (See Comments)    unknown  . Quinolones Other (See Comments)    unknown  . Sulfonamide Derivatives Other (See Comments)    unknown  .  Zolpidem Tartrate Other (See Comments)    Hallucinations    Physical Exam  Vitals  Blood pressure 162/98, pulse 109, temperature 97.8 F (36.6 C), temperature source Oral, resp. rate 25, height 5' (1.524 m), weight 49.896 kg (110 lb), SpO2 96.00%.   1. General elderly chronically ill-appearing frail female lying in bed in NAD,    2. Normal affect and insight, Not Suicidal or Homicidal, Awake Alert, Oriented X  3.  3. No F.N deficits, ALL C.Nerves Intact, Strength 5/5 all 4 extremities, Sensation intact all 4 extremities, Plantars down going.  4. Ears and Eyes appear Normal, Conjunctivae clear, PERRLA. Moist Oral Mucosa.  5. Supple Neck, No JVD, No cervical lymphadenopathy appriciated, No Carotid Bruits.  6. Symmetrical Chest wall movement, fair air movement bilaterally, no wheezing.  7. irregular irregular, tachycardic, No Gallops, Rubs or Murmurs, No Parasternal Heave.  8. Positive Bowel Sounds, Abdomen Soft, Non tender, No organomegaly appriciated,No rebound -guarding or rigidity.  9.  No Cyanosis, Normal Skin Turgor, No Skin Rash or Bruise.  10. Good muscle tone,  joints appear normal , no effusions, Normal ROM.  11. No Palpable Lymph Nodes in Neck or Axillae    Data Review  CBC  Recent Labs Lab 07/03/13 2218  WBC 9.4  HGB 12.3  HCT 37.0  PLT 282  MCV 89.6  MCH 29.8  MCHC 33.2  RDW 15.0  LYMPHSABS 0.4*  MONOABS 0.5  EOSABS 0.0  BASOSABS 0.0   ------------------------------------------------------------------------------------------------------------------  Chemistries   Recent Labs Lab 07/03/13 2218  NA 139  K 4.9  CL 101  CO2 25  GLUCOSE 143*  BUN 29*  CREATININE 1.03  CALCIUM 9.4  AST 13  ALT 13  ALKPHOS 163*  BILITOT <0.2*   ------------------------------------------------------------------------------------------------------------------ estimated creatinine clearance is 26.6 ml/min (by C-G formula based on Cr of  1.03). ------------------------------------------------------------------------------------------------------------------ No results found for this basename: TSH, T4TOTAL, FREET3, T3FREE, THYROIDAB,  in the last 72 hours   Coagulation profile No results found for this basename: INR, PROTIME,  in the last 168 hours ------------------------------------------------------------------------------------------------------------------- No results found for this basename: DDIMER,  in the last 72 hours -------------------------------------------------------------------------------------------------------------------  Cardiac Enzymes  Recent Labs Lab 07/03/13 2200  TROPONINI <0.30   ------------------------------------------------------------------------------------------------------------------ No components found with this basename: POCBNP,    ---------------------------------------------------------------------------------------------------------------  Urinalysis    Component Value Date/Time   COLORURINE YELLOW 07/29/2012 0204   APPEARANCEUR CLOUDY* 07/29/2012 0204   LABSPEC 1.018 07/29/2012 0204   PHURINE 5.0 07/29/2012 0204   GLUCOSEU NEGATIVE 07/29/2012 0204   HGBUR NEGATIVE 07/29/2012 0204   BILIRUBINUR NEGATIVE 07/29/2012 0204   KETONESUR NEGATIVE 07/29/2012 0204   PROTEINUR NEGATIVE 07/29/2012 0204   UROBILINOGEN 0.2 07/29/2012 0204   NITRITE NEGATIVE 07/29/2012 0204   LEUKOCYTESUR LARGE* 07/29/2012 0204    ----------------------------------------------------------------------------------------------------------------  Imaging results:   Dg Chest 2 View  07/03/2013   CLINICAL DATA:  80 year old with shortness of breath.  EXAM: CHEST  2 VIEW  COMPARISON:  06/25/2012 and prior chest radiographs  FINDINGS: Cardiomegaly and hyperinflation again noted.  There is no evidence of focal airspace disease, pulmonary edema, suspicious pulmonary nodule/mass, pleural effusion, or pneumothorax.  No acute bony  abnormalities are identified.  IMPRESSION: Cardiomegaly without evidence of acute cardiopulmonary disease.   Electronically Signed   By: Laveda AbbeJeff  Hu M.D.   On: 07/03/2013 23:07    My personal review of EKG: Rhythm A. fib with RVR at 131 beats per minute   Assessment & Plan  Active Problems:   Atrial fibrillation with RVR   HYPERLIPIDEMIA   HYPERTENSION   Atrial fibrillation   Dyspnea   Chest pain   COPD exacerbation   Physical deconditioning    1. chronic respiratory failure and shortness of breath: Patient on baseline 2 L nasal cannula at home, has worsening shortness of breath most likely due to to her A. fib with RVR, and COPD exacerbation, even though patient has no wheezing, but she has been  on the by mouth prednisone for a few days. 2. A. fib with RVR: Patient will be continued on Cardizem drip, was admitted to step down, would have morning team consult cardiology service, she will be continued on her by mouth Cardizem 300 mg extended release daily, at this point we'll avoid any beta blockers giving her COPD exacerbation, will leave it up to cardiology to decide if patient needs to be resumed back on her amiodarone which was stopped last year. 3. COPD exacerbation: Will change by mouth prednisone to Solu-Medrol, would have her on ipratropium and Xopenex every 6 hours, will resume her back on Advair and Spiriva, and given the fact she does not have any green or yellow productive sputum, there is no indication for any antibiotics 4. Hyperlipidemia. Continue with statin 5. Hypothyroidism. Continue with Synthroid 6. Hypertension. Blood pressure mildly elevated, continue with home medication, as well patient was started on Cardizem drip so I would anticipate a drop in her blood pressure 7. Chest pain. Patient had brief episode, most likely related to her dyspnea and A. fib, first troponin is negative, already given 324 mg of aspirin, continue to cycle cardiac enzymes and follow the  trend  DVT Prophylaxis on Xarelto  AM Labs Ordered, also please review Full Orders  Family Communication: Admission, patients condition and plan of care including tests being ordered have been discussed with the patient and daughter over the phone who indicate understanding and agree with the plan and Code Status.  Code Status DO NOT RESUSCITATE, this was confirmed by patient and her daughter,  Likely DC to home  Condition GUARDED   Time spent in minutes : 60 minutes    Julie Rich M.D on 07/04/2013 at 1:31 AM    [facility] Office  [phone]

## 2013-07-04 NOTE — Consult Note (Addendum)
CARDIOLOGY CONSULT NOTE   Patient ID: Julie OharaWanda G Pinkett MRN: [id number], DOB/AGE: 78/19/1923   Admit date: 07/03/2013 Date of Consult: 07/04/2013   Primary Physician: Rudi HeapMOORE, DONALD, MD Primary Cardiologist: Lewayne BuntingGregg Taylor, MD  Pt. Profile  80 yo F with PMHx of chronic lung disease/asthma or non-smoking related COPD with home O2 dependence, HFpEF on lasix, chronic AF on Xarelto, mild age-related valvular heart disease with mild AR and mild MR, HTN, HLD, hypothyroidism, anxiety and GERD who is admitted with AF with RVR in setting of worsened pulmonary status over the past few weeks.    Problem List  Past Medical History  Diagnosis Date  . HTN (hypertension)   . A-fib   . Dyslipidemia   . Hypothyroidism   . Stroke   . Asthma   . Shortness of breath   . CHF (congestive heart failure)   . GERD (gastroesophageal reflux disease)   . Anxiety   . COPD (chronic obstructive pulmonary disease)     Past Surgical History  Procedure Laterality Date  . Total abdominal hysterectomy    . Breast surgery      45-50 YEARS AGO LUMP REMOVED  . Appendectomy    . Cardioversion  06/26/2011    Procedure: CARDIOVERSION;  Surgeon: Marca Anconaalton McLean, MD;  [facility];  Service: Cardiovascular;  Laterality: N/A;  . Tee without cardioversion N/A 07/24/2012    Procedure: TRANSESOPHAGEAL ECHOCARDIOGRAM (TEE);  Surgeon: Pricilla RifflePaula V Ross, MD;  [facility];  Service: Cardiovascular;  Laterality: N/A;  . Cardioversion N/A 07/24/2012    Procedure: CARDIOVERSION;  Surgeon: Pricilla RifflePaula V Ross, MD;  [facility];  Service: Cardiovascular;  Laterality: N/A;  . Cardioversion N/A 09/14/2012    Procedure: CARDIOVERSION;  Surgeon: Pricilla RifflePaula V Ross, MD;  [facility];  Service: Cardiovascular;  Laterality: N/A;     Allergies  Allergies  Allergen Reactions  . Ciprofloxacin Other (See Comments)    unknown  . Quinolones Other (See Comments)    unknown  . Sulfonamide Derivatives Other (See Comments)   unknown  . Zolpidem Tartrate Other (See Comments)    Hallucinations    HPI   Julie Rich is a 80 y.o. female with PMHx of chronic lung disease/asthma or non-smoking related COPD with home O2 dependence, HFpEF on lasix, chronic AF on Xarelto (last 2D surface Echo in Feb. 2014 showing mild LA dilatation), mild age-related valvular heart disease with mild AR and mild MR, HTN, HLD, hypothyroidism, anxiety and GERD. She follows with Dr. Vassie LollAlva for Pulmonary care and Dr. Ladona Ridgelaylor for Cardiac follow-up of her AFib and HTN. She was seen by Dr. Ladona Ridgelaylor in May 2014 at which time she was told to stop her amiodarone; it is clearly documented that patient's pulmonary history and symptoms far pre-date the short course of amiodarone she was on in early 2014--she was intolerant of amiodarone and therefore the decision was made to pursue a rate control rather than rhythm control strategy.  Julie Rich reports that she has had increased issues with dyspnea and wheezing over the past few weeks. She has had a cough that is minimally productive. No fevers, chills, or sweats. Oxygen requirement has been stable at 2L. She has been treated with courses of steroids without improvement. Today she was found to be in AF with RVR to 140s. She was wheezing today but did not have symptoms that she attributed to her AF with RVR. She reports that over the past few weeks she has had periods of RVR. She  has tolerated her rivaroxaban well without bleeding issues.   Based on elevated HR and wheezing she was sent to the [facility] ED. She was started on a diltiazem gtt and admitted to the hospitalist service. Cardiology was consulted for assistance in managing AF with RVR.    ROS notable for no PND, orthopnea, or LE edema. States she has lost a few pounds over the last few days but cannot determine why.    Inpatient Medications  . aspirin  325 mg Oral Once  . cholecalciferol  1,000 Units Oral Daily  . diltiazem  300 mg Oral Daily  . famotidine  20  mg Oral Daily  . guaiFENesin  1,200 mg Oral Q12H  . ipratropium  0.5 mg Nebulization Q6H  . levalbuterol  0.63 mg Nebulization Q6H  . [START ON 07/05/2013] levothyroxine  50 mcg Oral Custom   And  . levothyroxine  75 mcg Oral Custom  . LORazepam  0.5 mg Oral TID  . methylPREDNISolone (SOLU-MEDROL) injection  60 mg Intravenous Q8H  . mometasone-formoterol  2 puff Inhalation BID  . pantoprazole  40 mg Oral Daily  . Rivaroxaban  15 mg Oral Q supper  . senna-docusate  2 tablet Oral QHS  . sodium chloride  3 mL Intravenous Q12H  . temazepam  15 mg Oral QHS    Family History Family History  Problem Relation Age of Onset  . Coronary artery disease Neg Hx   . Stroke Brother   . Breast cancer Mother      Social History History   Social History  . Marital Status: Divorced    Spouse Name: N/A    Number of Children: N/A  . Years of Education: N/A   Occupational History  . retired    Social History Main Topics  . Smoking status: Never Smoker   . Smokeless tobacco: Never Used     Comment: does not smoke   . Alcohol Use: No  . Drug Use: No  . Sexual Activity: No   Other Topics Concern  . Not on file   Social History Narrative   Lives alone, has a daughter fairly close by, does not drink a lot of caffeine.      Review of Systems All other systems reviewed and are otherwise negative except as noted above.  Physical Exam  Blood pressure 170/74, pulse 92, temperature 97.8 F (36.6 C), temperature source Oral, resp. rate 31, height 5' (1.524 m), weight 47 kg (103 lb 9.9 oz), SpO2 97.00%.  General: Pleasant, NAD. Thin frail.  Psych: Normal affect. Neuro: Alert and oriented X 3. Moves all extremities spontaneously. HEENT: Normal  Neck: Supple without bruits or JVD. Lungs:  Moderately tachypneic, able to complete sentences. Coarse scattered crackles with mid and apical lung field predominance. Somewhat decreased air movement. No wheezes.  Heart: Irregularly irregular.  Tachycardic no s3, s4, or murmurs. Abdomen: Soft, non-tender, non-distended, BS + x 4.  Extremities: No clubbing, cyanosis or edema. DP/PT/Radials/femorals 2+ and equal bilaterally.  Labs   Recent Labs  07/03/13 2200 07/04/13 0220  TROPONINI <0.30 <0.30   Lab Results  Component Value Date   WBC 8.6 07/04/2013   HGB 12.4 07/04/2013   HCT 38.2 07/04/2013   MCV 91.0 07/04/2013   PLT 300 07/04/2013    Recent Labs Lab 07/03/13 2218 07/04/13 0220  NA 139 137  K 4.9 4.5  CL 101 98  CO2 25 25  BUN 29* 30*  CREATININE 1.03 1.03  CALCIUM 9.4 9.1  PROT 7.5  --   BILITOT <0.2*  --   ALKPHOS 163*  --   ALT 13  --   AST 13  --   GLUCOSE 143* 202*   No results found for this basename: CHOL, HDL, LDLCALC, TRIG   Lab Results  Component Value Date   DDIMER <0.27 06/27/2012    Radiology/Studies  Dg Chest 2 View  07/03/2013   CLINICAL DATA:  80 year old with shortness of breath.  EXAM: CHEST  2 VIEW  COMPARISON:  06/25/2012 and prior chest radiographs  FINDINGS: Cardiomegaly and hyperinflation again noted.  There is no evidence of focal airspace disease, pulmonary edema, suspicious pulmonary nodule/mass, pleural effusion, or pneumothorax.  No acute bony abnormalities are identified.  IMPRESSION: Cardiomegaly without evidence of acute cardiopulmonary disease.   Electronically Signed   By: Laveda Abbe M.D.   On: 07/03/2013 23:07    ECG  AF with RVR to 130s  ASSESSMENT AND PLAN  1. AF with RVR in setting of progressive pulmonary disease. She is asymptomatic even at faster rates. She has failed 1c agents and cannot tolerate amiodarone due to dyspnea.  Continue rate control strategy for now. Could be considered for dofetilide if rhythm control option is desired as it may be the only way to keep her rates controlled.  - Increase oral diltiazem 90mg  q 6h - Continue diltiazem drip; wean as tolerated - Continue Xarelto - Check TSH - K>4, Mg>2  2. Hypertension. In setting of steroid pulse. Improved  on higher dose of diltiazem for now.   3. HFpEF. Euvolemic despite AF with RVR.   Thank you for the opportunity to participate in the care of this patient.   Julie Seat, MD 07/04/2013, 3:56 AM

## 2013-07-04 NOTE — Progress Notes (Signed)
Admitted after midnight.  Chart reviewed. Patient examined.  Tele a fib rate about 90. Remains on cardizem gtt and oral cardizem.  Still dyspneic, coughing. No CP.  TSH pending. Add antibiotics, antitussive. Continue SDU., nebs, steroids.  Crista Curborinna Gisselle Galvis, M.D. [facility] [phone]

## 2013-07-04 NOTE — Progress Notes (Signed)
Patient ID: [name], female   DOB: 1922-02-24, 80 y.o.   MRN: [id number] Patient admitted earlier this morning with atrial fibrillation/RVR in setting of COPD exacerbation.  She is on antibiotics, nebs, steroids.  HR now in 80s on diltiazem gtt.  Will see if we can transition over to diltiazem CD 360 mg daily starting this afternoon.  She is on Xarelto, will continue.  BP stable.   Marca AnconaDalton McLean 07/04/2013 11:33 AM

## 2013-07-04 NOTE — Discharge Instructions (Signed)
Information on my medicine - XARELTO® (Rivaroxaban) ° °This medication education was reviewed with me or my healthcare representative as part of my discharge preparation.  The pharmacist that spoke with me during my hospital stay was:  Cleveland Paiz B, RPH ° °Why was Xarelto® prescribed for you? °Xarelto® was prescribed for you to reduce the risk of a blood clot forming that can cause a stroke if you have a medical condition called atrial fibrillation (a type of irregular heartbeat). ° °What do you need to know about xarelto® ? °Take your Xarelto® ONCE DAILY at the same time every day with your evening meal. °If you have difficulty swallowing the tablet whole, you may crush it and mix in applesauce just prior to taking your dose. ° °Take Xarelto® exactly as prescribed by your doctor and DO NOT stop taking Xarelto® without talking to the doctor who prescribed the medication.  Stopping without other stroke prevention medication to take the place of Xarelto® may increase your risk of developing a clot that causes a stroke.  Refill your prescription before you run out. ° °After discharge, you should have regular check-up appointments with your healthcare provider that is prescribing your Xarelto®.  In the future your dose may need to be changed if your kidney function or weight changes by a significant amount. ° °What do you do if you miss a dose? °If you are taking Xarelto® ONCE DAILY and you miss a dose, take it as soon as you remember on the same day then continue your regularly scheduled once daily regimen the next day. Do not take two doses of Xarelto® at the same time or on the same day.  ° °Important Safety Information °A possible side effect of Xarelto® is bleeding. You should call your healthcare provider right away if you experience any of the following: °  Bleeding from an injury or your nose that does not stop. °  Unusual colored urine (red or dark brown) or unusual colored stools (red or black). °  Unusual  bruising for unknown reasons. °  A serious fall or if you hit your head (even if there is no bleeding). ° °Some medicines may interact with Xarelto® and might increase your risk of bleeding while on Xarelto®. To help avoid this, consult your healthcare provider or pharmacist prior to using any new prescription or non-prescription medications, including herbals, vitamins, non-steroidal anti-inflammatory drugs (NSAIDs) and supplements. ° °This website has more information on Xarelto®: www.xarelto.com. ° ° °

## 2013-07-05 ENCOUNTER — Encounter (HOSPITAL_COMMUNITY): Payer: Self-pay | Admitting: Family Medicine

## 2013-07-05 DIAGNOSIS — I5032 Chronic diastolic (congestive) heart failure: Secondary | ICD-10-CM

## 2013-07-05 DIAGNOSIS — F411 Generalized anxiety disorder: Secondary | ICD-10-CM | POA: Diagnosis present

## 2013-07-05 LAB — GLUCOSE, CAPILLARY
GLUCOSE-CAPILLARY: 142 mg/dL — AB (ref 70–99)
Glucose-Capillary: 121 mg/dL — ABNORMAL HIGH (ref 70–99)
Glucose-Capillary: 129 mg/dL — ABNORMAL HIGH (ref 70–99)

## 2013-07-05 LAB — TSH: TSH: 0.657 u[IU]/mL (ref 0.350–4.500)

## 2013-07-05 MED ORDER — BIOTENE DRY MOUTH MT LIQD
15.0000 mL | Freq: Two times a day (BID) | OROMUCOSAL | Status: DC
  Administered 2013-07-06 – 2013-07-08 (×5): 15 mL via OROMUCOSAL

## 2013-07-05 MED ORDER — DIGOXIN 0.25 MG/ML IJ SOLN: 0.2500 mg | Freq: Once | INTRAMUSCULAR | Status: DC

## 2013-07-05 MED ORDER — PREDNISONE 20 MG PO TABS
40.0000 mg | ORAL_TABLET | Freq: Two times a day (BID) | ORAL | Status: DC
  Administered 2013-07-05 – 2013-07-06 (×3): 40 mg via ORAL
  Filled 2013-07-05 (×5): qty 2

## 2013-07-05 MED ORDER — DIGOXIN 250 MCG PO TABS
0.2500 mg | ORAL_TABLET | Freq: Once | ORAL | Status: AC
  Administered 2013-07-05: 0.25 mg via ORAL
  Filled 2013-07-05: qty 1

## 2013-07-05 MED ORDER — INSULIN ASPART 100 UNIT/ML ~~LOC~~ SOLN
0.0000 [IU] | Freq: Three times a day (TID) | SUBCUTANEOUS | Status: DC
  Administered 2013-07-05 (×2): 1 [IU] via SUBCUTANEOUS
  Administered 2013-07-06: 2 [IU] via SUBCUTANEOUS

## 2013-07-05 MED ORDER — BENZONATATE 100 MG PO CAPS
100.0000 mg | ORAL_CAPSULE | Freq: Once | ORAL | Status: AC
  Administered 2013-07-05: 100 mg via ORAL
  Filled 2013-07-05: qty 1

## 2013-07-05 MED ORDER — DIGOXIN 0.0625 MG HALF TABLET
0.0625 mg | ORAL_TABLET | Freq: Every day | ORAL | Status: DC
  Administered 2013-07-06 – 2013-07-08 (×3): 0.0625 mg via ORAL
  Filled 2013-07-05 (×3): qty 1

## 2013-07-05 NOTE — Progress Notes (Signed)
    Subjective:  Still short of breath. No chest pain or other complaints. Feels weak.  Objective:  Vital Signs in the last 24 hours: Temp:  [97.3 F (36.3 C)-98.1 F (36.7 C)] 97.3 F (36.3 C) (02/09 0815) Pulse Rate:  [78-114] 99 (02/09 0400) Resp:  [18-37] 20 (02/09 0815) BP: (130-164)/(41-99) 151/99 mmHg (02/09 0815) SpO2:  [94 %-100 %] 96 % (02/09 0819) FiO2 (%):  [4 %-36 %] 36 % (02/08 2039)  Intake/Output from previous day: 02/08 0701 - 02/09 0700 In: 690 [P.O.:600; I.V.:90] Out: 425 [Urine:425]  Physical Exam: Pt is alert and oriented, elderly, frail-appearing woman in NAD HEENT: normal Neck: JVP - normal Lungs: decreased air movement bilaterally, end-expiratory wheezing CV: irregular, tachy without murmur or gallop Abd: soft, NT, Positive BS, no hepatomegaly Ext: no C/C/E Skin: warm/dry no rash   Lab Results:  Recent Labs  07/03/13 2218 07/04/13 0220  WBC 9.4 8.6  HGB 12.3 12.4  PLT 282 300    Recent Labs  07/03/13 2218 07/04/13 0220  NA 139 137  K 4.9 4.5  CL 101 98  CO2 25 25  GLUCOSE 143* 202*  BUN 29* 30*  CREATININE 1.03 1.03    Recent Labs  07/04/13 1213 07/04/13 1420  TROPONINI <0.30 <0.30    Cardiac Studies: 2D echo 05/13/2013: Study Conclusions  - Left ventricle: The cavity size was normal. Wall thickness was normal. Systolic function was normal. The estimated ejection fraction was in the range of 55% to 60%. Wall motion was normal; there were no regional wall motion abnormalities. - Aortic valve: Mild regurgitation. - Mitral valve: Calcified annulus. Mild regurgitation. - Left atrium: The atrium was moderately dilated. - Right atrium: The atrium was moderately dilated. - Tricuspid valve: Moderate regurgitation. - Pulmonary arteries: Systolic pressure was mildly to moderately increased. PA peak pressure: 40mm Hg (S). - Pericardium, extracardiac: A trivial pericardial effusion was identified.  Tele: Atrial  fibrillation, heart rate about 120 bpm  Assessment/Plan:  1. Atrial fibrillation with RVR: rate increased in setting of acute respiratory illness. Now off of IV diltiazem. Continue oral diltiazem 360 mg daily. Not good options: no beta-blocker in setting lung disease/acute exacerbation, intolerant to amio. Recommend add low-dose digoxin. IV dig on backorder so not available per pharmacy. Will have to tolerate heart rates in the low 100's while she is ill I suspect.  She is anticoagulated with Xarelto. Will stop Aspirin.  2. Acute on chronic respiratory failure - COPD exacerbation  3. Chronic diastolic CHF - stable.  Tonny BollmanMichael Cythia Bachtel, M.D. 07/05/2013, 8:28 AM

## 2013-07-05 NOTE — Progress Notes (Signed)
Pt transferred [facility].  VSS, pt stable. Experiencing strong cough.

## 2013-07-05 NOTE — Progress Notes (Signed)
[facility] PROGRESS NOTE  Julie Rich ZOX:[id number] DOB: [birth year] DOA: 07/03/2013 PCP: Rudi Heap, MD  Assessment/Plan:   COPD exacerbation/acute bronchitis, slowly improving. Change steroids to by mouth. Continue antibiotics, nebulizers. Active Problems:   Atrial fibrillation with RVR: has been off Cardizem drip for over 24 hours. We'll transfer to telemetry. Digoxin and Cardizem per cardiology.   HYPERLIPIDEMIA   HYPERTENSION Physical deconditioning r/o dysphagia. RN reports cough with eating. Speech consult  Code Status:  dnr Family Communication:   Disposition Plan:    HPI/Subjective: Breathing easier. Had a lot of anxiety yesterday. Cough improved. No chest pain or palpitations.  Objective: Filed Vitals:   07/05/13 0815  BP: 151/99  Pulse:   Temp: 97.3 F (36.3 C)  Resp: 20    Intake/Output Summary (Last 24 hours) at 07/05/13 0951 Last data filed at 07/05/13 0810  Gross per 24 hour  Intake    640 ml  Output    525 ml  Net    115 ml   Filed Weights   07/03/13 2126 07/04/13 0215  Weight: 49.896 kg (110 lb) 47 kg (103 lb 9.9 oz)   Telemetry: Atrial fibrillation. Rate about 100.  Exam:   General:  More comfortable today. Appears less anxious. Less cough.  Cardiovascular: Irregularly irregular without murmurs gallops rubs  Respiratory: Bronchial breath sounds on the left. Diminished on the right. No wheeze rhonchi or rales  Abdomen: Soft nontender nondistended  Ext: No clubbing cyanosis or edema  Basic Metabolic Panel:  Recent Labs Lab 07/03/13 2218 07/04/13 0220  NA 139 137  K 4.9 4.5  CL 101 98  CO2 25 25  GLUCOSE 143* 202*  BUN 29* 30*  CREATININE 1.03 1.03  CALCIUM 9.4 9.1  MG  --  2.3   Liver Function Tests:  Recent Labs Lab 07/03/13 2218  AST 13  ALT 13  ALKPHOS 163*  BILITOT <0.2*  PROT 7.5  ALBUMIN 3.8   No results found for this basename: LIPASE, AMYLASE,  in the last 168 hours No results found for this  basename: AMMONIA,  in the last 168 hours CBC:  Recent Labs Lab 07/03/13 2218 07/04/13 0220  WBC 9.4 8.6  NEUTROABS 8.6*  --   HGB 12.3 12.4  HCT 37.0 38.2  MCV 89.6 91.0  PLT 282 300   Cardiac Enzymes:  Recent Labs Lab 07/03/13 2200 07/04/13 0220 07/04/13 1213 07/04/13 1420  TROPONINI <0.30 <0.30 <0.30 <0.30   BNP (last 3 results)  Recent Labs  09/25/12 1731 05/12/13 1932 07/03/13 2200  PROBNP 233.0* 949.0* 1877.0*   CBG: No results found for this basename: GLUCAP,  in the last 168 hours  Recent Results (from the past 240 hour(s))  MRSA PCR SCREENING     Status: None   Collection Time    07/04/13  2:05 AM      Result Value Range Status   MRSA by PCR NEGATIVE  NEGATIVE Final   Comment:            The GeneXpert MRSA Assay (FDA     approved for NASAL specimens     only), is one component of a     comprehensive MRSA colonization     surveillance program. It is not     intended to diagnose MRSA     infection nor to guide or     monitor treatment for     MRSA infections.     Studies: Dg Chest 2 View  07/03/2013  CLINICAL DATA:  80 year old with shortness of breath.  EXAM: CHEST  2 VIEW  COMPARISON:  06/25/2012 and prior chest radiographs  FINDINGS: Cardiomegaly and hyperinflation again noted.  There is no evidence of focal airspace disease, pulmonary edema, suspicious pulmonary nodule/mass, pleural effusion, or pneumothorax.  No acute bony abnormalities are identified.  IMPRESSION: Cardiomegaly without evidence of acute cardiopulmonary disease.   Electronically Signed   By: Laveda AbbeJeff  Hu M.D.   On: 07/03/2013 23:07    Scheduled Meds: . azithromycin  250 mg Oral Daily  . cefTRIAXone (ROCEPHIN)  IV  1 g Intravenous Q24H  . cholecalciferol  1,000 Units Oral Daily  . dextromethorphan-guaiFENesin  1 tablet Oral BID  . [START ON 07/06/2013] digoxin  0.0625 mg Oral Daily  . digoxin  0.25 mg Oral Once  . diltiazem  360 mg Oral Daily  . furosemide  20 mg Oral Daily  .  ipratropium  0.5 mg Nebulization BID  . levalbuterol  0.63 mg Nebulization BID  . levothyroxine  50 mcg Oral Custom   And  . levothyroxine  75 mcg Oral Custom  . LORazepam  0.5 mg Oral TID  . methylPREDNISolone (SOLU-MEDROL) injection  60 mg Intravenous Q8H  . mometasone-formoterol  2 puff Inhalation BID  . pantoprazole  40 mg Oral Daily  . potassium chloride  16 mEq Oral Daily  . Rivaroxaban  15 mg Oral Q supper  . senna-docusate  2 tablet Oral QHS  . sodium chloride  3 mL Intravenous Q12H  . temazepam  15 mg Oral QHS   Continuous Infusions:   Time spent: 35 minutes  Kelley Polinsky L  [facility] Pager [phone]. If 7PM-7AM, please contact night-coverage at [email or web address], password [email or web address] 07/05/2013, 9:51 AM  LOS: 2 days

## 2013-07-05 NOTE — Evaluation (Signed)
Clinical/Bedside Swallow Evaluation Patient Details  Name: DANELL VAZQUEZ MRN: [id number] Date of Birth: [birth year]  Today's Date: 07/05/2013 Time: 7829-5621 SLP Time Calculation (min): 14 min  Past Medical History:  Past Medical History  Diagnosis Date  . HTN (hypertension)   . A-fib   . Dyslipidemia   . Hypothyroidism   . Stroke   . Asthma   . Shortness of breath   . CHF (congestive heart failure)   . GERD (gastroesophageal reflux disease)   . Anxiety   . COPD (chronic obstructive pulmonary disease)    Past Surgical History:  Past Surgical History  Procedure Laterality Date  . Total abdominal hysterectomy    . Breast surgery      45-50 YEARS AGO LUMP REMOVED  . Appendectomy    . Cardioversion  06/26/2011    Procedure: CARDIOVERSION;  Surgeon: Marca Ancona, MD;  [facility];  Service: Cardiovascular;  Laterality: N/A;  . Tee without cardioversion N/A 07/24/2012    Procedure: TRANSESOPHAGEAL ECHOCARDIOGRAM (TEE);  Surgeon: Pricilla Riffle, MD;  Location: [facility] ENDOSCOPY;  Service: Cardiovascular;  Laterality: N/A;  . Cardioversion N/A 07/24/2012    Procedure: CARDIOVERSION;  Surgeon: Pricilla Riffle, MD;  Location: [facility] ENDOSCOPY;  Service: Cardiovascular;  Laterality: N/A;  . Cardioversion N/A 09/14/2012    Procedure: CARDIOVERSION;  Surgeon: Pricilla Riffle, MD;  [facility] ENDOSCOPY;  Service: Cardiovascular;  Laterality: N/A;   HPI:  80 y.o. female, with known history of atrial fibrillation, COPD, on 2 L nasal cannula at baseline, patient reports shortness of breath over the last couple of weeks, has been prescribed 2 courses of by mouth prednisone, patient presents with shortness of breath and wheezing today, patient was noticed to be in A. fib with RVR with heart rate in the 140s, with transient improvement after IV Cardizem push, currently on Cardizem drip, has cough with clear phlegm, chest x-ray cardiomegaly without evidence of acute cardiopulmonary disease.   Assessment / Plan /  Recommendation Clinical Impression  Pt. exhibited functional oropharyngeal swallow overall, with one cough after approximately 8 trials thin water (cup/straw).  Mildly delayed oral mastication and transit with cracker.  Educated pt. in increased risk of (silent) aspiration with COPD and techniques to decrease risk (do not eat when SOB, sit upright, small bites/sips, rest breaks).  SLP does not recommend modifying diet at this time but will follow up once to ensure safety with diet/liquids and further education.      Aspiration Risk  Moderate    Diet Recommendation Regular;Thin liquid   Liquid Administration via: Cup;Straw Medication Administration: Whole meds with puree Supervision: Patient able to self feed;Intermittent supervision to cue for compensatory strategies Compensations: Slow rate;Small sips/bites (do not eat when SOB, rest breaks) Postural Changes and/or Swallow Maneuvers: Seated upright 90 degrees    Other  Recommendations Oral Care Recommendations: Oral care BID   Follow Up Recommendations  None    Frequency and Duration min 1 x/week  2 weeks   Pertinent Vitals/Pain WDL        Swallow Study         Oral/Motor/Sensory Function Overall Oral Motor/Sensory Function: Appears within functional limits for tasks assessed   Ice Chips Ice chips: Not tested   Thin Liquid Thin Liquid: Impaired Presentation: Straw;Cup Pharyngeal  Phase Impairments: Cough - Immediate (x 1)    Nectar Thick Nectar Thick Liquid: Not tested   Honey Thick Honey Thick Liquid: Not tested   Puree Puree: Within functional limits   Solid  GO    Solid: Impaired Oral Phase Impairments: Reduced lingual movement/coordination Oral Phase Functional Implications:  (mild transit delay)       Darrow BussingLisa Willis Giani Betzold M.Ed [employer]SLP Pager [phone]  07/05/2013

## 2013-07-06 ENCOUNTER — Inpatient Hospital Stay (HOSPITAL_COMMUNITY): Payer: Medicare Other

## 2013-07-06 DIAGNOSIS — R5381 Other malaise: Secondary | ICD-10-CM

## 2013-07-06 DIAGNOSIS — F411 Generalized anxiety disorder: Secondary | ICD-10-CM

## 2013-07-06 DIAGNOSIS — I509 Heart failure, unspecified: Secondary | ICD-10-CM

## 2013-07-06 DIAGNOSIS — I5033 Acute on chronic diastolic (congestive) heart failure: Secondary | ICD-10-CM

## 2013-07-06 LAB — BLOOD GAS, ARTERIAL
Acid-Base Excess: 1.8 mmol/L (ref 0.0–2.0)
Bicarbonate: 25.9 mEq/L — ABNORMAL HIGH (ref 20.0–24.0)
Drawn by: 222511
O2 Content: 4 L/min
O2 Saturation: 97 %
Patient temperature: 97.7
TCO2: 27.1 mmol/L (ref 0–100)
pCO2 arterial: 39.9 mmHg (ref 35.0–45.0)
pH, Arterial: 7.425 (ref 7.350–7.450)
pO2, Arterial: 89.8 mmHg (ref 80.0–100.0)

## 2013-07-06 LAB — GLUCOSE, CAPILLARY
Glucose-Capillary: 117 mg/dL — ABNORMAL HIGH (ref 70–99)
Glucose-Capillary: 189 mg/dL — ABNORMAL HIGH (ref 70–99)
Glucose-Capillary: 108 mg/dL — ABNORMAL HIGH (ref 70–99)
Glucose-Capillary: 136 mg/dL — ABNORMAL HIGH (ref 70–99)

## 2013-07-06 MED ORDER — GUAIFENESIN ER 600 MG PO TB12
600.0000 mg | ORAL_TABLET | Freq: Two times a day (BID) | ORAL | Status: DC | PRN
  Administered 2013-07-06 (×2): 600 mg via ORAL
  Filled 2013-07-06 (×3): qty 1

## 2013-07-06 MED ORDER — LORAZEPAM 0.5 MG PO TABS
0.5000 mg | ORAL_TABLET | Freq: Three times a day (TID) | ORAL | Status: DC
  Administered 2013-07-06 – 2013-07-08 (×6): 0.5 mg via ORAL
  Filled 2013-07-06 (×6): qty 1

## 2013-07-06 MED ORDER — PREDNISONE 20 MG PO TABS
40.0000 mg | ORAL_TABLET | Freq: Every day | ORAL | Status: DC
  Administered 2013-07-07: 40 mg via ORAL
  Filled 2013-07-06 (×2): qty 2

## 2013-07-06 MED ORDER — BENZONATATE 100 MG PO CAPS
200.0000 mg | ORAL_CAPSULE | Freq: Three times a day (TID) | ORAL | Status: DC | PRN
  Administered 2013-07-06 – 2013-07-07 (×2): 200 mg via ORAL
  Filled 2013-07-06 (×2): qty 2

## 2013-07-06 MED ORDER — ENSURE COMPLETE PO LIQD
237.0000 mL | Freq: Two times a day (BID) | ORAL | Status: DC
  Administered 2013-07-06 – 2013-07-07 (×3): 237 mL via ORAL

## 2013-07-06 NOTE — Progress Notes (Signed)
Speech Language Pathology Treatment: Dysphagia  Patient Details Name: Julie Rich MRN: [id number] DOB: 06/06/1921 Today's Date: 07/06/2013 Time: 1540-1615 SLP Time Calculation (min): 35 min  Assessment / Plan / Recommendation Clinical Impression  Pt observed to have increased coughing today, with cough following more than 50% of sips of water. Suspect intermittent penetration aspiration with sensation and strong cough. Pt does not have history of pna, X-Rays have been clear. Discussed options for treatment: MBS, diet modification, strategies. Daughter presents agrees that pt unlikely to follow any diet modification since POs are important to QOL.  Agreed to stay on regular diet and thin liquids with mild risk.  SLP offered aspiration precautions and strategies to mitigate risk (verbla and written). No f/u needed will sign off.    HPI HPI: 80 y.o. female, with known history of atrial fibrillation, COPD, on 2 L nasal cannula at baseline, patient reports shortness of breath over the last couple of weeks, has been prescribed 2 courses of by mouth prednisone, patient presents with shortness of breath and wheezing today, patient was noticed to be in A. fib with RVR with heart rate in the 140s, with transient improvement after IV Cardizem push, currently on Cardizem drip, has cough with clear phlegm, chest x-ray cardiomegaly without evidence of acute cardiopulmonary disease.   Pertinent Vitals NA  SLP Plan  All goals met    Recommendations Diet recommendations: Regular;Thin liquid Liquids provided via: Cup;No straw Medication Administration: Whole meds with puree Supervision: Patient able to self feed;Intermittent supervision to cue for compensatory strategies Compensations: Slow rate;Small sips/bites;Follow solids with liquid Postural Changes and/or Swallow Maneuvers: Seated upright 90 degrees              Oral Care Recommendations: Oral care BID Follow up Recommendations: None Plan: All goals  met    GO     , MA CCC-SLP [phone]  ,  Caroline 07/06/2013, 4:17 PM    

## 2013-07-06 NOTE — Progress Notes (Signed)
Subjective:  Called by RN to see stat for ? SOB and decreased breath sounds on exam.  Objective:  Vital Signs in the last 24 hours: Temp:  [97.3 F (36.3 C)-97.8 F (36.6 C)] 97.7 F (36.5 C) (02/10 0901) Pulse Rate:  [84-122] 122 (02/10 0901) Resp:  [20-24] 22 (02/10 0901) BP: (132-183)/(64-95) 137/87 mmHg (02/10 0901) SpO2:  [91 %-97 %] 96 % (02/10 0901) Weight:  [103 lb 9.9 oz (47 kg)-104 lb 15 oz (47.6 kg)] 103 lb 9.9 oz (47 kg) (02/10 0518)  Intake/Output from previous day:  Intake/Output Summary (Last 24 hours) at 07/06/13 0921 Last data filed at 07/06/13 0844  Gross per 24 hour  Intake    243 ml  Output    525 ml  Net   -282 ml    Physical Exam: General appearance: alert, cooperative, no distress and elderly, frail Lungs: clear, breath sounds more prominent on Rt Heart: irregularly irregular rhythm   Rate: 110  Rhythm: atrial fibrillation  Lab Results:  Recent Labs  07/03/13 2218 07/04/13 0220  WBC 9.4 8.6  HGB 12.3 12.4  PLT 282 300    Recent Labs  07/03/13 2218 07/04/13 0220  NA 139 137  K 4.9 4.5  CL 101 98  CO2 25 25  GLUCOSE 143* 202*  BUN 29* 30*  CREATININE 1.03 1.03    Recent Labs  07/04/13 1213 07/04/13 1420  TROPONINI <0.30 <0.30   No results found for this basename: INR,  in the last 72 hours  Imaging: Imaging results have been reviewed  Cardiac Studies:  Assessment/Plan:   Active Problems:   HYPERLIPIDEMIA   HYPERTENSION   Atrial fibrillation   Dyspnea   Chest pain   COPD exacerbation   Physical deconditioning   Atrial fibrillation with RVR   Anxiety state, unspecified    PLAN: O2 sat >92%, she does not appear to be in distress, skin warm and dry. Check PXR and ABG.  Corine ShelterLuke Kilroy PA-C Bee[place] [phone] 07/06/2013, 9:21 AM  Patinet seen and examined  I agree with findings of L kilroy above  Patient comfortable  Reported bradycardic earlier though I cannot find I would keep on same regimen

## 2013-07-06 NOTE — Progress Notes (Signed)
[id number]: RN called to patient's room because the pt was complaining of chest pain. Pt rating the chest pain 5/10. Pt HR in 120s. Pt is in A-fib. EKG peformed. Nitro given x1. Vitals taken(See Doc Flowsheet) 0915: PA at beside. Stat X-Ray and ABGs ordered. Pt stating chest pain is still 5/10 Nitro given.  0920: Pt stating that chest pain is 3/10. Pt is complaining of abdominal pain. PA is aware.  0930: Pt is not complaining of any chest pain. Pt is still complaining of abdominal pain. Will continue to monitor. HR is 90. SpO2 is 96% on 4L [place]. BP is 126/66

## 2013-07-06 NOTE — Progress Notes (Signed)
Notified on-call hospitalist of patient requesting something else for her severe productive cough. Mucous is thick light yellow with small amount of blood noted. Mucinex PRN was given by day nurse around change of shift but patient states is not working and would like something else for her cough. Will continue to monitor patient to end of shift.

## 2013-07-06 NOTE — Evaluation (Signed)
Physical Therapy Evaluation Patient Details Name: Julie Rich MRN: [id number] DOB: 01/27/1922 Today's Date: 07/06/2013 Time: 1330-1350 PT Time Calculation (min): 20 min  PT Assessment / Plan / Recommendation History of Present Illness  80 y.o. female, with known history of  atrial fibrillation, on Xarelto, usually controlled with by mouth Cardizem, as well known history of COPD, on 2 L nasal cannula at baseline, patient reports shortness of breath over the last couple of weeks, has been prescribed 2 courses of by mouth prednisone, patient presents with shortness of breath and wheezing today, patient was noticed to be in A. fib with RVR with heart rate in the 140s, with transient improvement after IV Cardizem push, currently on Cardizem drip, has cough with clear phlegm, chest x-ray does not show any opacity or infiltrate or volume overload, patient complains of brief episode of chest pain, currently resolved, her troponins were negative, hospitalist requested to admit  Clinical Impression  Pt presents with decreased strength, activity tolerance and mobility and will benefit from acute PT services to address deficits and increase functional independence.    PT Assessment  Patient needs continued PT services    Follow Up Recommendations  Home health PT;Supervision/Assistance - 24 hour (Pt states she does not want to go SNF, PT educated pt on recommendation for 24 hour supervision, pt states her aide may be able to come 24 hours/day)    Does the patient have the potential to tolerate intense rehabilitation      Barriers to Discharge Inaccessible home environment;Decreased caregiver support PT recommends 24 hour assistance    Equipment Recommendations   (TBD)    Recommendations for Other Services     Frequency Min 3X/week    Precautions / Restrictions Restrictions Weight Bearing Restrictions: No   Pertinent Vitals/Pain No c/o pain. spO2 98% on 4L O2 throughout treatment.  HR 109 with  sitting and standing      Mobility  Bed Mobility Overal bed mobility: Modified Independent General bed mobility comments: increased time, uses rails Transfers Overall transfer level: Needs assistance Equipment used: 1 person hand held assist Transfers: Sit to/from Stand Sit to Stand: Min assist General transfer comment: lifting assist, steadying assist due to weakness Ambulation/Gait Ambulation/Gait assistance: Min assist Ambulation Distance (Feet): 10 Feet Assistive device: 1 person hand held assist Gait velocity interpretation: <1.8 ft/sec, indicative of risk for recurrent falls General Gait Details: distance limited by pt fatigue, steadying assist needed    Exercises     PT Diagnosis: Difficulty walking;Generalized weakness  PT Problem List: Decreased strength;Decreased activity tolerance;Decreased balance;Decreased mobility;Cardiopulmonary status limiting activity PT Treatment Interventions: Gait training;DME instruction;Balance training;Modalities;Stair training;Functional mobility training;Therapeutic activities;Therapeutic exercise;Wheelchair mobility training;Patient/family education     PT Goals(Current goals can be found in the care plan section) Acute Rehab PT Goals Patient Stated Goal: get out of here PT Goal Formulation: With patient Time For Goal Achievement: 07/20/13 Potential to Achieve Goals: Good  Visit Information  Last PT Received On: 07/06/13 Assistance Needed: +1 History of Present Illness: 80 y.o. female, with known history of  atrial fibrillation, on Xarelto, usually controlled with by mouth Cardizem, as well known history of COPD, on 2 L nasal cannula at baseline, patient reports shortness of breath over the last couple of weeks, has been prescribed 2 courses of by mouth prednisone, patient presents with shortness of breath and wheezing today, patient was noticed to be in A. fib with RVR with heart rate in the 140s, with transient improvement after IV  Cardizem push, currently  on Cardizem drip, has cough with clear phlegm, chest x-ray does not show any opacity or infiltrate or volume overload, patient complains of brief episode of chest pain, currently resolved, her troponins were negative, hospitalist requested to admit       Prior Functioning  Home Living Family/patient expects to be discharged to:: Private residence Living Arrangements: Alone Available Help at Discharge: Personal care attendant;Home health;Available PRN/intermittently (pt states aide comes 2x a day) Type of Home: House Home Access: Stairs to enter Entergy CorporationEntrance Stairs-Number of Steps: 4 Entrance Stairs-Rails: Right;Left Home Layout: One level Prior Function Comments: pt states she does not use a device, has home aide to assist with IADLs Communication Communication: No difficulties    Cognition  Cognition Arousal/Alertness: Awake/alert Behavior During Therapy: WFL for tasks assessed/performed Overall Cognitive Status: Within Functional Limits for tasks assessed    Extremity/Trunk Assessment Upper Extremity Assessment Upper Extremity Assessment: Generalized weakness Lower Extremity Assessment Lower Extremity Assessment: Generalized weakness Cervical / Trunk Assessment Cervical / Trunk Assessment: Normal   Balance    End of Session PT - End of Session Equipment Utilized During Treatment: Gait belt;Oxygen Activity Tolerance: Patient limited by fatigue Patient left: in bed;with call bell/phone within reach Nurse Communication: Mobility status  GP     Koron Godeaux 07/06/2013, 1:59 PM

## 2013-07-06 NOTE — Progress Notes (Signed)
INITIAL NUTRITION ASSESSMENT  DOCUMENTATION CODES Per approved criteria  -Not Applicable   INTERVENTION: Provide Ensure Complete BID with meals Provide Magic Cup BID between meals Encourage PO intake  NUTRITION DIAGNOSIS: Inadequate oral intake related to poor appetite as evidenced by pt refusing meals.   Goal: Pt to meet >/= 90% of their estimated nutrition needs   Monitor:  PO intake, weight, labs  Reason for Assessment: Malnutrition Screening Tool, score of 2  80 y.o. female  Admitting Dx: <principal problem not specified>  ASSESSMENT: 80 yo F with PMHx of chronic lung disease/asthma or non-smoking related COPD with home O2 dependence, HFpEF on lasix, chronic AF on Xarelto, mild age-related valvular heart disease with mild AR and mild MR, HTN, HLD, hypothyroidism, anxiety and GERD who is admitted with AF with RVR in setting of worsened pulmonary status over the past few weeks.  Pt not feeling well at time of visit. Pt reports having a good appetite and eating well PTA. Discussed pt's weight history which shows weight loss. Pt states she has lost weight because she doesn't eat sometimes. She states she has no appetite today, she did not eat any breakfast and does not think she will be able to eat anything at lunch. Pt states she couldn't eat breakfast because her heart was acting up. Weight history shows 5% weight loss in the past week. Pt willing to try supplements while admitted.   Height: Ht Readings from Last 1 Encounters:  07/05/13 5' (1.524 m)    Weight: Wt Readings from Last 1 Encounters:  07/06/13 103 lb 9.9 oz (47 kg)    Ideal Body Weight: 100 lbs  % Ideal Body Weight: 013%  Wt Readings from Last 10 Encounters:  07/06/13 103 lb 9.9 oz (47 kg)  06/30/13 108 lb (48.988 kg)  05/26/13 109 lb (49.442 kg)  05/13/13 109 lb 9.6 oz (49.714 kg)  04/16/13 110 lb (49.896 kg)  09/24/12 111 lb 12.8 oz (50.712 kg)  09/18/12 108 lb 9.6 oz (49.261 kg)  09/10/12 113 lb  (51.256 kg)  08/20/12 112 lb (50.803 kg)  08/18/12 113 lb (51.256 kg)    Usual Body Weight: 110 lbs  % Usual Body Weight: 94%  BMI:  Body mass index is 20.24 kg/(m^2).  Estimated Nutritional Needs: Kcal: 1300-1500 Protein: >/=70 grams Fluid: 1.4 L/day (1500 ml fluid restriction per MD)  Skin: intact  Diet Order: Cardiac  EDUCATION NEEDS: -No education needs identified at this time   Intake/Output Summary (Last 24 hours) at 07/06/13 1122 Last data filed at 07/06/13 0844  Gross per 24 hour  Intake    240 ml  Output    525 ml  Net   -285 ml    Last BM:  2/8  Labs:   Recent Labs Lab 07/03/13 2218 07/04/13 0220  NA 139 137  K 4.9 4.5  CL 101 98  CO2 25 25  BUN 29* 30*  CREATININE 1.03 1.03  CALCIUM 9.4 9.1  MG  --  2.3  GLUCOSE 143* 202*    CBG (last 3)   Recent Labs  07/05/13 2129 07/06/13 0643 07/06/13 1036  GLUCAP 142* 117* 189*    Scheduled Meds: . antiseptic oral rinse  15 mL Mouth Rinse BID  . azithromycin  250 mg Oral Daily  . cefTRIAXone (ROCEPHIN)  IV  1 g Intravenous Q24H  . cholecalciferol  1,000 Units Oral Daily  . digoxin  0.0625 mg Oral Daily  . diltiazem  360 mg Oral Daily  .  furosemide  20 mg Oral Daily  . insulin aspart  0-9 Units Subcutaneous TID WC  . ipratropium  0.5 mg Nebulization BID  . levalbuterol  0.63 mg Nebulization BID  . levothyroxine  50 mcg Oral Custom   And  . levothyroxine  75 mcg Oral Custom  . LORazepam  0.5 mg Oral TID  . mometasone-formoterol  2 puff Inhalation BID  . pantoprazole  40 mg Oral Daily  . potassium chloride  16 mEq Oral Daily  . [START ON 07/07/2013] predniSONE  40 mg Oral Q breakfast  . Rivaroxaban  15 mg Oral Q supper  . senna-docusate  2 tablet Oral QHS  . sodium chloride  3 mL Intravenous Q12H  . temazepam  15 mg Oral QHS    Continuous Infusions:   Past Medical History  Diagnosis Date  . HTN (hypertension)   . A-fib   . Dyslipidemia   . Hypothyroidism   . Stroke   . Asthma    . Shortness of breath   . CHF (congestive heart failure)   . GERD (gastroesophageal reflux disease)   . Anxiety   . COPD (chronic obstructive pulmonary disease)   . Dysrhythmia   . Arthritis     Past Surgical History  Procedure Laterality Date  . Total abdominal hysterectomy    . Breast surgery      45-50 YEARS AGO LUMP REMOVED  . Appendectomy    . Cardioversion  06/26/2011    Procedure: CARDIOVERSION;  Surgeon: Marca Anconaalton McLean, MD;  [facility];  Service: Cardiovascular;  Laterality: N/A;  . Tee without cardioversion N/A 07/24/2012    Procedure: TRANSESOPHAGEAL ECHOCARDIOGRAM (TEE);  Surgeon: Pricilla RifflePaula V Ross, MD;  [facility];  Service: Cardiovascular;  Laterality: N/A;  . Cardioversion N/A 07/24/2012    Procedure: CARDIOVERSION;  Surgeon: Pricilla RifflePaula V Ross, MD;  [facility];  Service: Cardiovascular;  Laterality: N/A;  . Cardioversion N/A 09/14/2012    Procedure: CARDIOVERSION;  Surgeon: Pricilla RifflePaula V Ross, MD;  [facility];  Service: Cardiovascular;  Laterality: N/A;  . Tonsillectomy    . Eye surgery      removed cataract in both eyes 5 years ago  . Tubal ligation      Ian Malkineanne Barnett RD, LDN Inpatient Clinical Dietitian Pager: [phone] After Hours Pager: [phone]

## 2013-07-06 NOTE — Progress Notes (Signed)
On-call hospitalist gave orders for Tessalon pearl for cough PRN. Will administer to patient as ordered and will continue to monitor patient to end of shift.

## 2013-07-06 NOTE — Progress Notes (Signed)
Called again for "HR in the 20s" but this was not found on telemetry. RN felt that the pt was briefly unresponsive, she is awake and alert now, VSS.  Will review with MD.  Corine ShelterLUKE KILROY PA-C 07/06/2013 9:38 AM [place] seen  Would continue to follow  Unable to find bradycardic spell.   Continue telemetry  ConsecoPaula Ross

## 2013-07-06 NOTE — Care Management Note (Addendum)
  Page 2 of 2   07/08/2013     2:47:22 PM   CARE MANAGEMENT NOTE 07/08/2013  Patient:  Julie Rich,Julie Rich   Account Number:  [id number]  Date Initiated:  07/06/2013  Documentation initiated by:  Chantell Kunkler  Subjective/Objective Assessment:   Admitted with chest pain, shortness of breath, Atrial fib, COPD exacerbation.     Action/Plan:   follow for disposition needs   Anticipated DC Date:  07/06/2013   Anticipated DC Plan:  HOME W HOME HEALTH SERVICES      DC Planning Services  CM consult      Choice offered to / List presented to:  NA        HH arranged  HH-1 RN  HH-10 DISEASE MANAGEMENT  HH-2 PT      Status of service:  Completed, signed off Medicare Important Message given?   (If response is "NO", the following Medicare IM given date fields will be blank) Date Medicare IM given:   Date Additional Medicare IM given:    Discharge Disposition:  HOME W HOME HEALTH SERVICES  Per UR Regulation:  Reviewed for med. necessity/level of care/duration of stay  If discussed at Long Length of Stay Meetings, dates discussed:    Comments:  07/08/2013 Disposition:  Home with priviate self pay sitters; HHS: RN, PT, CNA - [facility] notified Home oxygen active [facility]) ADD:  today Darald Uzzle RN, BSN, MSHL, CCM 07/08/2013  07/07/2013 IV Rocephin Emslee Lopezmartinez RN, BSN, MSHL, CCM 07/07/2013  07/06/2013 Social:  From home[place] with private self pay aid during day and alone at hs.  DTR states plan to make changes at hs at d/c and is aware patient nees 24 hour supervision. DTRJunious Dresser:  Connie [phone] [facility]. Hx/o past HHS with [facility] and elects same at d/c. Home DME:  walker, shower chair, home oxygen [facility])  PT RECS:  Home health PT;Supervision/Assistance - 24 hour (Pt states she does not want to go SNF, PT educated pt on recommendation for 24 hour supervision, pt states her aide may be able to come 24  hours/day) Disposition Plan:  Home with HHS:  RN, PT, CNA [facility])  CM will notify [facility] closer to d/c. Protable O2 tank in patients room for d/c. ADD: [address] Mikel Hardgrove RN, BSN, MSHL, CCM 07/06/2013

## 2013-07-06 NOTE — Progress Notes (Signed)
[facility] PROGRESS NOTE  Julie Rich [id number] DOB: 03-23-1922 DOA: 07/03/2013 PCP: Rudi HeapMOORE, DONALD, MD  Assessment/Plan:   COPD exacerbation/acute bronchitis, improving. Taper prednisone. Continue antibiotics. Change mucinex dm to guaifenesin PRN. May be contributing to nausea. Epigastric pain/chest pain/nausea: better. EKG, CXR, ABG ok. May be anxiety related as well.    Atrial fibrillation with RVR: per cardiology. On cardizem and digoxin   HYPERLIPIDEMIA   HYPERTENSION Physical deconditioning:  PT eval. oob Speech consulted. "functional oropharyngeal swallow overall, with one cough after approximately 8 trials thin water (cup/straw). Mildly delayed oral mastication and transit with cracker. Educated pt. in increased risk of (silent) aspiration with COPD and techniques to decrease risk (do not eat when SOB, sit upright, small bites/sips, rest breaks). SLP does not recommend modifying diet" Anxiety: meds adjusted.  Code Status:  dnr Family Communication:   Disposition Plan:    HPI/Subjective: Had epigastric pain, nausea and anxiety this am. Received a nitroglycerin, EKG, chest x-ray, ABG. Just got Ativan and feels better. No pain or nausea currently. No dyspnea, just anxiety.  Objective: Filed Vitals:   07/06/13 0925  BP: 120/57  Pulse: 122  Temp:   Resp:     Intake/Output Summary (Last 24 hours) at 07/06/13 1031 Last data filed at 07/06/13 0844  Gross per 24 hour  Intake    243 ml  Output    525 ml  Net   -282 ml   Filed Weights   07/04/13 0215 07/05/13 1555 07/06/13 0518  Weight: 47 kg (103 lb 9.9 oz) 47.6 kg (104 lb 15 oz) 47 kg (103 lb 9.9 oz)   Telemetry: Atrial fibrillation. Rate about 110.  Exam:   General:  Anxious.  Cardiovascular: Irregularly irregular without murmurs gallops rubs  Respiratory: CTA without WRR. Good air movement  Abdomen: Soft nontender nondistended  Ext: No clubbing cyanosis or edema  Basic Metabolic Panel:  Recent  Labs Lab 07/03/13 2218 07/04/13 0220  NA 139 137  K 4.9 4.5  CL 101 98  CO2 25 25  GLUCOSE 143* 202*  BUN 29* 30*  CREATININE 1.03 1.03  CALCIUM 9.4 9.1  MG  --  2.3   Liver Function Tests:  Recent Labs Lab 07/03/13 2218  AST 13  ALT 13  ALKPHOS 163*  BILITOT <0.2*  PROT 7.5  ALBUMIN 3.8   No results found for this basename: LIPASE, AMYLASE,  in the last 168 hours No results found for this basename: AMMONIA,  in the last 168 hours CBC:  Recent Labs Lab 07/03/13 2218 07/04/13 0220  WBC 9.4 8.6  NEUTROABS 8.6*  --   HGB 12.3 12.4  HCT 37.0 38.2  MCV 89.6 91.0  PLT 282 300   Cardiac Enzymes:  Recent Labs Lab 07/03/13 2200 07/04/13 0220 07/04/13 1213 07/04/13 1420  TROPONINI <0.30 <0.30 <0.30 <0.30   BNP (last 3 results)  Recent Labs  09/25/12 1731 05/12/13 1932 07/03/13 2200  PROBNP 233.0* 949.0* 1877.0*   CBG:  Recent Labs Lab 07/05/13 1250 07/05/13 1624 07/05/13 2129 07/06/13 0643  GLUCAP 121* 129* 142* 117*    Recent Results (from the past 240 hour(s))  MRSA PCR SCREENING     Status: None   Collection Time    07/04/13  2:05 AM      Result Value Range Status   MRSA by PCR NEGATIVE  NEGATIVE Final   Comment:            The GeneXpert MRSA Assay (FDA  approved for NASAL specimens     only), is one component of a     comprehensive MRSA colonization     surveillance program. It is not     intended to diagnose MRSA     infection nor to guide or     monitor treatment for     MRSA infections.     Studies: Dg Chest Port 1 View  07/06/2013   CLINICAL DATA:  Dyspnea  EXAM: PORTABLE CHEST - 1 VIEW  COMPARISON:  07/03/2013  FINDINGS: Heart and mediastinal contours are stable. The heart appears enlarged. Atherosclerotic calcification of the thoracic aortic arch is noted. The lungs are normally expanded and clear. No focal airspace opacities, effusions, or pneumothorax. Cardiac leads project over the chest. The bones appear osteopenic.   IMPRESSION: 1. No acute cardiopulmonary disease.  1. Thoracic aorta atherosclerosis.   Electronically Signed   By: Britta Mccreedy M.D.   On: 07/06/2013 10:20    Scheduled Meds: . antiseptic oral rinse  15 mL Mouth Rinse BID  . azithromycin  250 mg Oral Daily  . cefTRIAXone (ROCEPHIN)  IV  1 g Intravenous Q24H  . cholecalciferol  1,000 Units Oral Daily  . dextromethorphan-guaiFENesin  1 tablet Oral BID  . digoxin  0.0625 mg Oral Daily  . diltiazem  360 mg Oral Daily  . furosemide  20 mg Oral Daily  . insulin aspart  0-9 Units Subcutaneous TID WC  . ipratropium  0.5 mg Nebulization BID  . levalbuterol  0.63 mg Nebulization BID  . levothyroxine  50 mcg Oral Custom   And  . levothyroxine  75 mcg Oral Custom  . LORazepam  0.5 mg Oral TID  . mometasone-formoterol  2 puff Inhalation BID  . pantoprazole  40 mg Oral Daily  . potassium chloride  16 mEq Oral Daily  . predniSONE  40 mg Oral BID  . Rivaroxaban  15 mg Oral Q supper  . senna-docusate  2 tablet Oral QHS  . sodium chloride  3 mL Intravenous Q12H  . temazepam  15 mg Oral QHS   Continuous Infusions:   Time spent: 35 minutes  Jakobee Brackins L  [facility] Pager [phone]. If 7PM-7AM, please contact night-coverage at [email or web address], password [facility] 07/06/2013, 10:31 AM  LOS: 3 days

## 2013-07-07 DIAGNOSIS — I5031 Acute diastolic (congestive) heart failure: Secondary | ICD-10-CM

## 2013-07-07 DIAGNOSIS — Z7901 Long term (current) use of anticoagulants: Secondary | ICD-10-CM

## 2013-07-07 LAB — GLUCOSE, CAPILLARY
GLUCOSE-CAPILLARY: 111 mg/dL — AB (ref 70–99)
Glucose-Capillary: 81 mg/dL (ref 70–99)
Glucose-Capillary: 111 mg/dL — ABNORMAL HIGH (ref 70–99)
Glucose-Capillary: 106 mg/dL — ABNORMAL HIGH (ref 70–99)

## 2013-07-07 MED ORDER — BENZONATATE 100 MG PO CAPS
200.0000 mg | ORAL_CAPSULE | Freq: Three times a day (TID) | ORAL | Status: DC
  Administered 2013-07-07 – 2013-07-08 (×3): 200 mg via ORAL
  Filled 2013-07-07 (×6): qty 2

## 2013-07-07 MED ORDER — CEFUROXIME AXETIL 500 MG PO TABS
500.0000 mg | ORAL_TABLET | Freq: Two times a day (BID) | ORAL | Status: DC
  Administered 2013-07-08: 500 mg via ORAL
  Filled 2013-07-07 (×3): qty 1

## 2013-07-07 MED ORDER — PREDNISONE 20 MG PO TABS
30.0000 mg | ORAL_TABLET | Freq: Every day | ORAL | Status: DC
  Administered 2013-07-08: 30 mg via ORAL
  Filled 2013-07-07 (×2): qty 1

## 2013-07-07 NOTE — Progress Notes (Signed)
Pt wanted to sit up briefly worried she would have to sit up all afternoon. Reassured pt that she can go back to bed after she has her lunch. Agreed to get up in chair. 1 assist and tolerated well. Pt says she gets nervous over a lot of things.

## 2013-07-07 NOTE — Progress Notes (Signed)
[facility] PROGRESS NOTE  Julie Rich:[id number] DOB: June 10, 1921 DOA: 07/03/2013 PCP: Rudi Heap, MD  Assessment/Plan:   COPD exacerbation/acute bronchitis, improving. Taper prednisone. Change to po abx.  Schedule tessalon Epigastric pain/chest pain/nausea: resolved   Atrial fibrillation with RVR: rate controlled   HYPERLIPIDEMIA   HYPERTENSION Physical deconditioning:  Anxiety: meds adjusted.  Code Status:  dnr Family Communication:  Daughter by phone Disposition Plan:  Home tomorrow if stable. With home PT  HPI/Subjective: Breathing okay. Still coughing quite a bit which is bothersome to her. No chest pain or nausea today.  Objective: Filed Vitals:   07/07/13 1228  BP:   Pulse: 92  Temp:   Resp:     Intake/Output Summary (Last 24 hours) at 07/07/13 1453 Last data filed at 07/07/13 1400  Gross per 24 hour  Intake    290 ml  Output   1100 ml  Net   -810 ml   Filed Weights   07/05/13 1555 07/06/13 0518 07/07/13 0645  Weight: 47.6 kg (104 lb 15 oz) 47 kg (103 lb 9.9 oz) 47.401 kg (104 lb 8 oz)   Telemetry: Atrial fibrillation. Rate about 110.  Exam:   General:  Comfortable. Breathing nonlabored.  Cardiovascular: Irregularly irregular without murmurs gallops rubs  Respiratory: CTA without WRR. Good air movement  Abdomen: Soft nontender nondistended  Ext: No clubbing cyanosis or edema  Basic Metabolic Panel:  Recent Labs Lab 07/03/13 2218 07/04/13 0220  NA 139 137  K 4.9 4.5  CL 101 98  CO2 25 25  GLUCOSE 143* 202*  BUN 29* 30*  CREATININE 1.03 1.03  CALCIUM 9.4 9.1  MG  --  2.3   Liver Function Tests:  Recent Labs Lab 07/03/13 2218  AST 13  ALT 13  ALKPHOS 163*  BILITOT <0.2*  PROT 7.5  ALBUMIN 3.8   No results found for this basename: LIPASE, AMYLASE,  in the last 168 hours No results found for this basename: AMMONIA,  in the last 168 hours CBC:  Recent Labs Lab 07/03/13 2218 07/04/13 0220  WBC 9.4 8.6   NEUTROABS 8.6*  --   HGB 12.3 12.4  HCT 37.0 38.2  MCV 89.6 91.0  PLT 282 300   Cardiac Enzymes:  Recent Labs Lab 07/03/13 2200 07/04/13 0220 07/04/13 1213 07/04/13 1420  TROPONINI <0.30 <0.30 <0.30 <0.30   BNP (last 3 results)  Recent Labs  09/25/12 1731 05/12/13 1932 07/03/13 2200  PROBNP 233.0* 949.0* 1877.0*   CBG:  Recent Labs Lab 07/06/13 1036 07/06/13 1619 07/06/13 2119 07/07/13 0617 07/07/13 1116  GLUCAP 189* 108* 136* 81 111*    Recent Results (from the past 240 hour(s))  MRSA PCR SCREENING     Status: None   Collection Time    07/04/13  2:05 AM      Result Value Ref Range Status   MRSA by PCR NEGATIVE  NEGATIVE Final   Comment:            The GeneXpert MRSA Assay (FDA     approved for NASAL specimens     only), is one component of a     comprehensive MRSA colonization     surveillance program. It is not     intended to diagnose MRSA     infection nor to guide or     monitor treatment for     MRSA infections.     Studies: Dg Chest Port 1 View  07/06/2013   CLINICAL DATA:  Dyspnea  EXAM: PORTABLE CHEST - 1 VIEW  COMPARISON:  07/03/2013  FINDINGS: Heart and mediastinal contours are stable. The heart appears enlarged. Atherosclerotic calcification of the thoracic aortic arch is noted. The lungs are normally expanded and clear. No focal airspace opacities, effusions, or pneumothorax. Cardiac leads project over the chest. The bones appear osteopenic.  IMPRESSION: 1. No acute cardiopulmonary disease.  1. Thoracic aorta atherosclerosis.   Electronically Signed   By: Britta MccreedySusan  Turner M.D.   On: 07/06/2013 10:20    Scheduled Meds: . antiseptic oral rinse  15 mL Mouth Rinse BID  . azithromycin  250 mg Oral Daily  . cefTRIAXone (ROCEPHIN)  IV  1 g Intravenous Q24H  . cholecalciferol  1,000 Units Oral Daily  . digoxin  0.0625 mg Oral Daily  . diltiazem  360 mg Oral Daily  . feeding supplement (ENSURE COMPLETE)  237 mL Oral BID WC  . furosemide  20 mg  Oral Daily  . insulin aspart  0-9 Units Subcutaneous TID WC  . ipratropium  0.5 mg Nebulization BID  . levalbuterol  0.63 mg Nebulization BID  . levothyroxine  50 mcg Oral Once per day on Mon Tue Thu Fri Sat   And  . levothyroxine  75 mcg Oral Once per day on Sun Wed  . LORazepam  0.5 mg Oral TID  . mometasone-formoterol  2 puff Inhalation BID  . pantoprazole  40 mg Oral Daily  . potassium chloride  16 mEq Oral Daily  . predniSONE  40 mg Oral Q breakfast  . Rivaroxaban  15 mg Oral Q supper  . senna-docusate  2 tablet Oral QHS  . sodium chloride  3 mL Intravenous Q12H  . temazepam  15 mg Oral QHS   Continuous Infusions:   Time spent: 25 minutes  Auguste Tebbetts L  [facility] Pager [phone]. If 7PM-7AM, please contact night-coverage at [email or web address], password [email or web address] 07/07/2013, 2:53 PM  LOS: 4 days

## 2013-07-07 NOTE — Progress Notes (Signed)
Patient evaluated for community based chronic disease management services with [facility] as a benefit of patient's [employer]. Spoke with patient at bedside to explain [facility].  Patient will receive a post discharge transition of care call and will be evaluated for monthly home visits for assessments and COPD disease process education.  She lives alone and her daughter is ill.  She did not elect a secondary contact.  Written consents obtained.  Patient was on hospice for COPD six months ago.  She can get and afford medicaions and food.  She has a private pay caregiver that provides homemaking and ADL assistance.  Left contact information and [facility] literature at bedside. Made Inpatient Case Manager aware that [facility] following. Of note, [facility] services does not replace or interfere with any services that are arranged by inpatient case management or social work.  For additional questions or referrals please contact Anibal Hendersonim Henderson BSN RN [facility] Liaison at [phone].

## 2013-07-07 NOTE — Progress Notes (Signed)
Patient is sleeping comfortably   HR is improved some.   No changes  Will follow peripherally.

## 2013-07-07 NOTE — Progress Notes (Signed)
Utilization review completed.  

## 2013-07-07 NOTE — Progress Notes (Signed)
See other note

## 2013-07-08 LAB — GLUCOSE, CAPILLARY: Glucose-Capillary: 112 mg/dL — ABNORMAL HIGH (ref 70–99)

## 2013-07-08 MED ORDER — ACETAMINOPHEN 325 MG PO TABS: 650.0000 mg | ORAL_TABLET | Freq: Four times a day (QID) | ORAL | Status: AC | PRN

## 2013-07-08 MED ORDER — GUAIFENESIN ER 600 MG PO TB12: 1200.0000 mg | ORAL_TABLET | Freq: Two times a day (BID) | ORAL | Status: AC | PRN

## 2013-07-08 MED ORDER — BENZONATATE 100 MG PO CAPS: 100.0000 mg | ORAL_CAPSULE | Freq: Three times a day (TID) | ORAL | Status: DC | PRN

## 2013-07-08 MED ORDER — DIGOXIN 62.5 MCG PO TABS: 0.0625 mg | ORAL_TABLET | Freq: Every day | ORAL | Status: AC

## 2013-07-08 MED ORDER — CEFUROXIME AXETIL 500 MG PO TABS: 500.0000 mg | ORAL_TABLET | Freq: Two times a day (BID) | ORAL | Status: DC

## 2013-07-08 MED ORDER — DILTIAZEM HCL ER COATED BEADS 360 MG PO CP24: 360.0000 mg | ORAL_CAPSULE | Freq: Every day | ORAL | Status: AC

## 2013-07-08 NOTE — Discharge Summary (Signed)
Physician Discharge Summary  [name] ZOX:[id number] DOB: 10/22/1921 DOA: 07/03/2013  PCP: Rudi HeapMOORE, DONALD, MD  Admit date: 07/03/2013 Discharge date: 07/08/2013  Time spent: greater than 30 minutes  Recommendations for Outpatient Follow-up:  1. Home PT  Discharge Diagnoses:    COPD exacerbation due to acute bronchitis     Atrial fibrillation with RVR HYPERLIPIDEMIA   HYPERTENSION   Chest pain   Physical deconditioning   Anxiety state, unspecified   Discharge Condition: stable  Filed Weights   07/06/13 0518 07/07/13 0645 07/08/13 0634  Weight: 47 kg (103 lb 9.9 oz) 47.401 kg (104 lb 8 oz) 47.8 kg (105 lb 6.1 oz)    History of present illness:  80 y.o. female, with known history of atrial fibrillation, on Xarelto, usually controlled with by mouth Cardizem, as well known history of COPD, on 2 L nasal cannula at baseline, patient reports shortness of breath over the last couple of weeks, has been prescribed 2 courses of by mouth prednisone, patient presents with shortness of breath and wheezing today, patient was noticed to be in A. fib with RVR with heart rate in the 140s, with transient improvement after IV Cardizem push, currently on Cardizem drip, has cough with clear phlegm, chest x-ray does not show any opacity or infiltrate or volume overload, patient complains of brief episode of chest pain, currently resolved, her troponins were negative  Hospital Course:  Admitted to hospitialists, step down unit. Started on cardizem gtt, steroids, antibiotics, bronchodilators.  Cardiology consulted. Transitioned to oral cardizem and started on digoxin. Wheezing and dyspnea improved.  Patient had problems with anxiety and required ativan.  PT consulted and home PT arranged  Procedures:  none  Consultations:  cardiology  Discharge Exam: Filed Vitals:   07/08/13 1000  BP: 133/57  Pulse: 75  Temp: 97.2 F (36.2 C)  Resp: 18    General: comfortable. Breathing  nonlabored Cardiovascular: irreg irreg Respiratory: CTA without WRR  Discharge Instructions  Discharge Orders   Future Orders Complete By Expires   Diet general  As directed    Increase activity slowly  As directed        Medication List         acetaminophen 325 MG tablet  Commonly known as:  TYLENOL  Take 2 tablets (650 mg total) by mouth every 6 (six) hours as needed for mild pain (or Fever >/= 101).     ADVAIR DISKUS 250-50 MCG/DOSE Aepb  Generic drug:  Fluticasone-Salmeterol  Inhale 2 puffs into the lungs 2 (two) times daily.     benzonatate 100 MG capsule  Commonly known as:  TESSALON  Take 1 capsule (100 mg total) by mouth 3 (three) times daily as needed for cough.     cefUROXime 500 MG tablet  Commonly known as:  CEFTIN  Take 1 tablet (500 mg total) by mouth 2 (two) times daily with a meal. For 2 doses     cholecalciferol 1000 UNITS tablet  Commonly known as:  VITAMIN D  Take 1,000 Units by mouth daily.     Digoxin 62.5 MCG Tabs  Take 0.0625 mg by mouth daily.     diltiazem 360 MG 24 hr capsule  Commonly known as:  CARDIZEM CD  Take 1 capsule (360 mg total) by mouth daily.     famotidine 20 MG tablet  Commonly known as:  PEPCID  Take 1 tablet by mouth daily.     furosemide 20 MG tablet  Commonly known as:  LASIX  Take 20 mg by mouth daily.     guaiFENesin 600 MG 12 hr tablet  Commonly known as:  MUCINEX  Take 2 tablets (1,200 mg total) by mouth 2 (two) times daily as needed for cough.     levothyroxine 50 MCG tablet  Commonly known as:  SYNTHROID, LEVOTHROID  Take 50-75 mcg by mouth daily before breakfast. Take one tablet ( ) by mouth every day except Sunday and Wednesday, take 1 1/2 tablets ( )     LORazepam 0.5 MG tablet  Commonly known as:  ATIVAN  Take 0.5 mg by mouth 3 (three) times daily. Scheduled doses to prevent panic/fear episodes     omeprazole 20 MG capsule  Commonly known as:  PRILOSEC  Take 20 mg by mouth daily.      OXYGEN-HELIUM IN  Inhale into the lungs daily. 2 Liters     potassium chloride 8 MEQ tablet  Commonly known as:  KLOR-CON  Take 16 mEq by mouth daily.     predniSONE 10 MG tablet  Commonly known as:  STERAPRED UNI-PAK  Take as directed     PROAIR HFA 108 (90 BASE) MCG/ACT inhaler  Generic drug:  albuterol  Inhale 2 puffs into the lungs every 6 (six) hours as needed for wheezing or shortness of breath.     Rivaroxaban 15 MG Tabs tablet  Commonly known as:  XARELTO  Take 15 mg by mouth daily with supper.     senna-docusate 8.6-50 MG per tablet  Commonly known as:  Senokot-S  Take 2 tablets by mouth at bedtime.     temazepam 15 MG capsule  Commonly known as:  RESTORIL  Take 15 mg by mouth at bedtime.     tiotropium 18 MCG inhalation capsule  Commonly known as:  SPIRIVA  Place 18 mcg into inhaler and inhale daily. Inhale 2 puffs from 1 capsule       Allergies  Allergen Reactions  . Ciprofloxacin Other (See Comments)    unknown  . Quinolones Other (See Comments)    unknown  . Sulfonamide Derivatives Other (See Comments)    unknown  . Zolpidem Tartrate Other (See Comments)    Hallucinations       Follow-up Information   Follow up with Rudi Heap, MD In 1 week.   Specialty:  Family Medicine   Contact information:   [address] [place] [address] [phone]       Follow up with Rollene Rotunda, MD.   Specialty:  Cardiology   Contact information:   [address] [place] [address] [phone]        The results of significant diagnostics from this hospitalization (including imaging, microbiology, ancillary and laboratory) are listed below for reference.    Significant Diagnostic Studies: Dg Chest 2 View  07/03/2013   CLINICAL DATA:  80 year old with shortness of breath.  EXAM: CHEST  2 VIEW  COMPARISON:  06/25/2012 and prior chest radiographs  FINDINGS: Cardiomegaly and hyperinflation again noted.  There  is no evidence of focal airspace disease, pulmonary edema, suspicious pulmonary nodule/mass, pleural effusion, or pneumothorax.  No acute bony abnormalities are identified.  IMPRESSION: Cardiomegaly without evidence of acute cardiopulmonary disease.   Electronically Signed   By: Laveda Abbe M.D.   On: 07/03/2013 23:07   Dg Chest Port 1 View  07/06/2013   CLINICAL DATA:  Dyspnea  EXAM: PORTABLE CHEST - 1 VIEW  COMPARISON:  07/03/2013  FINDINGS: Heart and mediastinal contours  are stable. The heart appears enlarged. Atherosclerotic calcification of the thoracic aortic arch is noted. The lungs are normally expanded and clear. No focal airspace opacities, effusions, or pneumothorax. Cardiac leads project over the chest. The bones appear osteopenic.  IMPRESSION: 1. No acute cardiopulmonary disease.  1. Thoracic aorta atherosclerosis.   Electronically Signed   By: Britta Mccreedy M.D.   On: 07/06/2013 10:20    Microbiology: Recent Results (from the past 240 hour(s))  MRSA PCR SCREENING     Status: None   Collection Time    07/04/13  2:05 AM      Result Value Ref Range Status   MRSA by PCR NEGATIVE  NEGATIVE Final   Comment:            The GeneXpert MRSA Assay (FDA     approved for NASAL specimens     only), is one component of a     comprehensive MRSA colonization     surveillance program. It is not     intended to diagnose MRSA     infection nor to guide or     monitor treatment for     MRSA infections.     Labs: Basic Metabolic Panel:  Recent Labs Lab 07/03/13 2218 07/04/13 0220  NA 139 137  K 4.9 4.5  CL 101 98  CO2 25 25  GLUCOSE 143* 202*  BUN 29* 30*  CREATININE 1.03 1.03  CALCIUM 9.4 9.1  MG  --  2.3   Liver Function Tests:  Recent Labs Lab 07/03/13 2218  AST 13  ALT 13  ALKPHOS 163*  BILITOT <0.2*  PROT 7.5  ALBUMIN 3.8   No results found for this basename: LIPASE, AMYLASE,  in the last 168 hours No results found for this basename: AMMONIA,  in the last 168  hours CBC:  Recent Labs Lab 07/03/13 2218 07/04/13 0220  WBC 9.4 8.6  NEUTROABS 8.6*  --   HGB 12.3 12.4  HCT 37.0 38.2  MCV 89.6 91.0  PLT 282 300   Cardiac Enzymes:  Recent Labs Lab 07/03/13 2200 07/04/13 0220 07/04/13 1213 07/04/13 1420  TROPONINI <0.30 <0.30 <0.30 <0.30   BNP: BNP (last 3 results)  Recent Labs  09/25/12 1731 05/12/13 1932 07/03/13 2200  PROBNP 233.0* 949.0* 1877.0*   CBG:  Recent Labs Lab 07/07/13 0617 07/07/13 1116 07/07/13 1625 07/07/13 2126 07/08/13 1130  GLUCAP 81 111* 111* 106* 112*       Signed:  Verble Styron L  [facility] 07/08/2013, 12:53 PM

## 2013-07-08 NOTE — Progress Notes (Signed)
Physical Therapy Treatment Patient Details Name: Julie Rich MRN: [id number] DOB: 1922/04/05 Today's Date: 07/08/2013 Time: [id number] PT Time Calculation (min): 24 min  PT Assessment / Plan / Recommendation  History of Present Illness 80 y.o. female, with known history of  atrial fibrillation, on Xarelto, usually controlled with by mouth Cardizem, as well known history of COPD, on 2 L nasal cannula at baseline, patient reports shortness of breath over the last couple of weeks, has been prescribed 2 courses of by mouth prednisone, patient presents with shortness of breath and wheezing today, patient was noticed to be in A. fib with RVR with heart rate in the 140s, with transient improvement after IV Cardizem push, currently on Cardizem drip, has cough with clear phlegm, chest x-ray does not show any opacity or infiltrate or volume overload, patient complains of brief episode of chest pain, currently resolved, her troponins were negative, hospitalist requested to admit   PT Comments   Pt admitted with above. Pt currently with functional limitations due to balance and endurance deficits.  Pt needs 24 hour care as she has poor safety awareness.  Needs to use RW and question whether pt will follow PT recommendations.  Ultimately pt needs short term SNF prior to d/c home if her personal care attendant cannot provide 24 hour care.  Pt will benefit from skilled PT to increase their independence and safety with mobility to allow discharge to the venue listed below.   Follow Up Recommendations  SNF;Supervision/Assistance - 24 hour (If has 24 hour care, can do HHPT. )     Does the patient have the potential to tolerate intense rehabilitation     Barriers to Discharge  ? 24 hour care      Equipment Recommendations  None recommended by PT    Recommendations for Other Services    Frequency Min 3X/week   Progress towards PT Goals Progress towards PT goals: Progressing toward goals  Plan Discharge plan  needs to be updated    Precautions / Restrictions Precautions Precautions: Fall Restrictions Weight Bearing Restrictions: No   Pertinent Vitals/Pain O2 on 3LO2 was 90% and >.  Drops without O2.  OTher VSS, No pain    Mobility  Bed Mobility Overal bed mobility: Modified Independent General bed mobility comments: increased time, uses rails Transfers Overall transfer level: Needs assistance Equipment used: 1 person hand held assist Transfers: Sit to/from Stand Sit to Stand: Min guard General transfer comment:  steadying assist due to weakness.  Pt got on 3N1 initiially as she had to use the bathroom. Ambulation/Gait Ambulation/Gait assistance: Min assist Ambulation Distance (Feet): 150 Feet Assistive device: None Gait Pattern/deviations: Staggering left;Staggering right;Narrow base of support;Decreased stride length;Step-to pattern Gait velocity interpretation: <1.8 ft/sec, indicative of risk for recurrent falls General Gait Details: Pt needs steadying asssit as she staggers at times.  Pt encouraged to use RW at all times at home and pt agreed but unsure if pt will.  Pt attempts to self correct but still needed assist a few times.  Pt on 3LO2 with ambulation and needed it to keep sats >90%.      Exercises General Exercises - Lower Extremity Ankle Circles/Pumps: AROM;Both;5 reps;Supine Long Arc Quad: AROM;Both;10 reps;Seated   PT Diagnosis:    PT Problem List:   PT Treatment Interventions:     PT Goals (current goals can now be found in the care plan section)    Visit Information  Last PT Received On: 07/08/13 Assistance Needed: +1 History of Present Illness:  80 y.o. female, with known history of  atrial fibrillation, on Xarelto, usually controlled with by mouth Cardizem, as well known history of COPD, on 2 L nasal cannula at baseline, patient reports shortness of breath over the last couple of weeks, has been prescribed 2 courses of by mouth prednisone, patient presents with  shortness of breath and wheezing today, patient was noticed to be in A. fib with RVR with heart rate in the 140s, with transient improvement after IV Cardizem push, currently on Cardizem drip, has cough with clear phlegm, chest x-ray does not show any opacity or infiltrate or volume overload, patient complains of brief episode of chest pain, currently resolved, her troponins were negative, hospitalist requested to admit    Subjective Data  Subjective: "I feel better but need a bath."   Cognition  Cognition Arousal/Alertness: Awake/alert Behavior During Therapy: WFL for tasks assessed/performed Overall Cognitive Status: Within Functional Limits for tasks assessed (lacks safety awareness)    Balance  Balance Overall balance assessment: Needs assistance;History of Falls Sitting-balance support: No upper extremity supported;Feet supported Sitting balance-Leahy Scale: Good Postural control: Posterior lean Standing balance support: Bilateral upper extremity supported;During functional activity Standing balance-Leahy Scale: Fair Standing balance comment: Pt stood at sink and washed hands and lost balance posteriorly but did catch self by grabbing sink.    End of Session PT - End of Session Equipment Utilized During Treatment: Gait belt;Oxygen Activity Tolerance: Patient limited by fatigue Patient left: in bed;with call bell/phone within reach;with bed alarm set Nurse Communication: Mobility status   GP     INGOLD,Lukas Pelcher 07/08/2013, 10:35 AM [facility] [phone] (pager)

## 2013-07-08 NOTE — Progress Notes (Signed)
SATURATION QUALIFICATIONS: (This note is used to comply with regulatory documentation for home oxygen)  Patient Saturations on Room Air at Rest = 92%  Patient Saturations on Room Air while Ambulating = 85%  Patient Saturations on 3 Liters of oxygen while Ambulating = 93%  Please briefly explain why patient needs home oxygen: Pt desats with activity on RA.  Will need home O2.  Thanks. [facility]wn Ingold,PT Acute Rehabilitation [phone] (pager)

## 2013-07-19 ENCOUNTER — Ambulatory Visit: Payer: Medicare Other | Admitting: Family Medicine

## 2013-07-24 DIAGNOSIS — I4891 Unspecified atrial fibrillation: Secondary | ICD-10-CM

## 2013-07-24 DIAGNOSIS — J441 Chronic obstructive pulmonary disease with (acute) exacerbation: Secondary | ICD-10-CM

## 2013-07-24 DIAGNOSIS — I509 Heart failure, unspecified: Secondary | ICD-10-CM

## 2013-07-24 DIAGNOSIS — J45909 Unspecified asthma, uncomplicated: Secondary | ICD-10-CM

## 2013-08-08 IMAGING — CR DG CHEST 2V
1 series · 1 of 1 positions shown · non-contrast
Comparison: 01/31/2010

CLINICAL DATA: Shortness of breath.  History of atrial
fibrillation.  CHF.  Cough.

CHEST - 2 VIEW

[w chest lat]
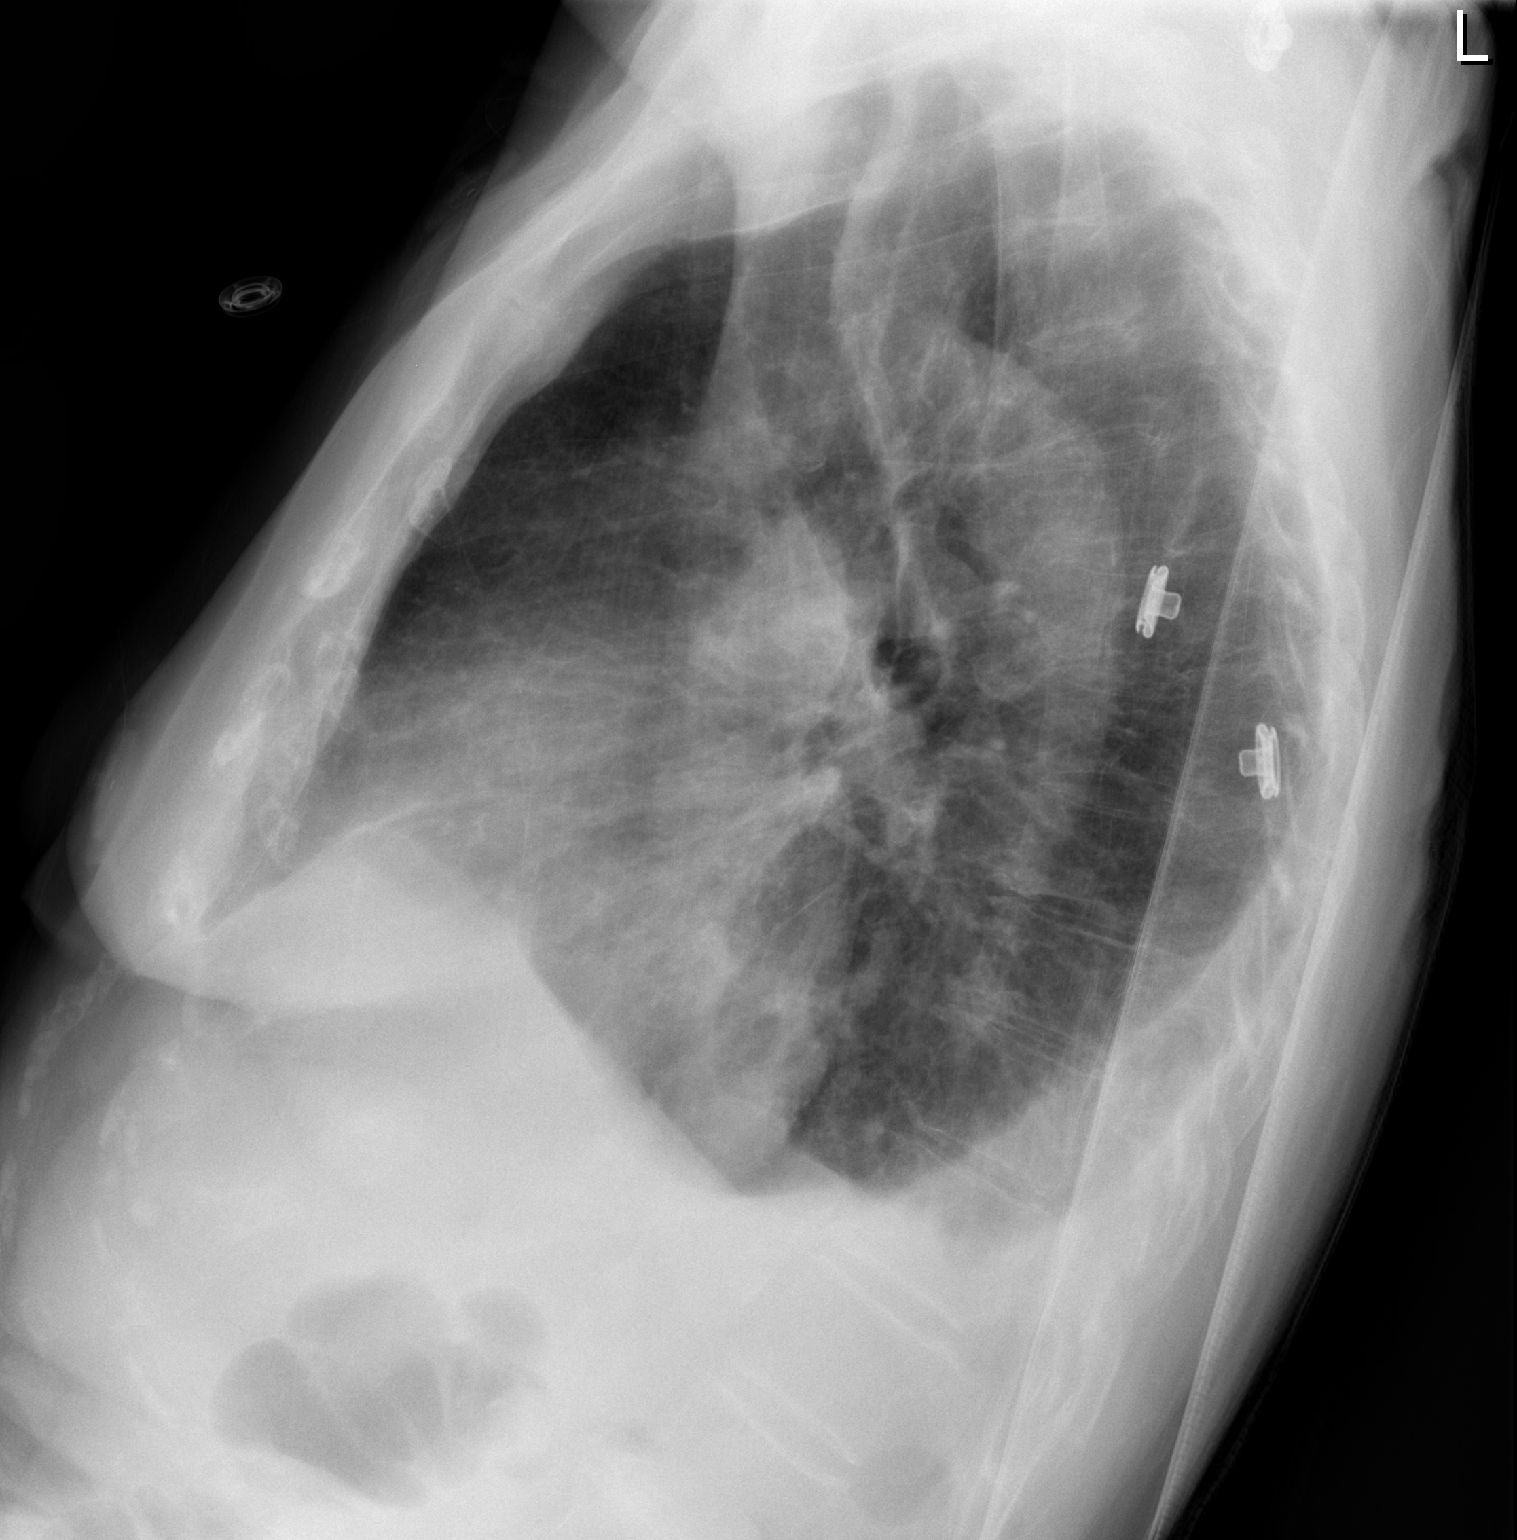

[1 of 1 positions shown; findings below may reference images not displayed]

FINDINGS: The heart is enlarged.  There are bilateral pleural
effusions.  More focal density is seen at the lung bases, right
greater than left, consistent with edema and/or infection.
IMPRESSION: 1.  Bilateral effusions and bibasilar opacities right greater than
left.
2.  Cardiomegaly.  Possible edema.

## 2013-08-12 ENCOUNTER — Telehealth: Payer: Self-pay | Admitting: Gastroenterology

## 2013-08-13 NOTE — Telephone Encounter (Signed)
Left message for pts daughter to call back.  Daughter states pt is still c/o abdominal pain and that it is getting worse. Request to be seen sooner than 1st available. Pt scheduled to see Doug SouJessica Zehr PA 08/20/13@3pm . Pt aware of appt.

## 2013-08-20 ENCOUNTER — Encounter: Payer: Self-pay | Admitting: Gastroenterology

## 2013-08-20 ENCOUNTER — Ambulatory Visit (INDEPENDENT_AMBULATORY_CARE_PROVIDER_SITE_OTHER): Payer: Medicare Other | Admitting: Gastroenterology

## 2013-08-20 VITALS — BP 142/70 | HR 76 | Ht 60.0 in | Wt 111.6 lb

## 2013-08-20 DIAGNOSIS — R109 Unspecified abdominal pain: Secondary | ICD-10-CM

## 2013-08-20 DIAGNOSIS — I6350 Cerebral infarction due to unspecified occlusion or stenosis of unspecified cerebral artery: Secondary | ICD-10-CM

## 2013-08-20 MED ORDER — OMEPRAZOLE 20 MG PO CPDR: 20.0000 mg | DELAYED_RELEASE_CAPSULE | Freq: Two times a day (BID) | ORAL | Status: DC

## 2013-08-20 MED ORDER — HYOSCYAMINE SULFATE 0.125 MG SL SUBL: 0.1250 mg | SUBLINGUAL_TABLET | SUBLINGUAL | Status: DC | PRN

## 2013-08-20 NOTE — Patient Instructions (Addendum)
Follow up visit is scheduled for 10-15-13 at 145 pm with Dr. Arlyce DiceKaplan  Please increase Omeprazole to one capsule by mouth twice daily  Please take Pepcid AC at bedtime   We have sent the following medications to your pharmacy for you to pick up at your convenience: Levsin   Your physician has requested that you go to the basement for the following lab work before leaving today: H-Pylori Stool Test

## 2013-08-24 ENCOUNTER — Encounter: Payer: Self-pay | Admitting: Gastroenterology

## 2013-08-24 ENCOUNTER — Other Ambulatory Visit: Payer: Self-pay | Admitting: Gastroenterology

## 2013-08-24 ENCOUNTER — Other Ambulatory Visit: Payer: Medicare Other

## 2013-08-24 DIAGNOSIS — R109 Unspecified abdominal pain: Secondary | ICD-10-CM

## 2013-08-24 NOTE — Progress Notes (Signed)
Patient came in for labs only.

## 2013-08-25 LAB — HELICOBACTER PYLORI  SPECIAL ANTIGEN: H. PYLORI ANTIGEN STOOL: NEGATIVE

## 2013-08-31 ENCOUNTER — Other Ambulatory Visit (INDEPENDENT_AMBULATORY_CARE_PROVIDER_SITE_OTHER): Payer: Medicare Other

## 2013-08-31 DIAGNOSIS — R7989 Other specified abnormal findings of blood chemistry: Secondary | ICD-10-CM

## 2013-08-31 DIAGNOSIS — R109 Unspecified abdominal pain: Secondary | ICD-10-CM | POA: Insufficient documentation

## 2013-08-31 DIAGNOSIS — R945 Abnormal results of liver function studies: Principal | ICD-10-CM

## 2013-08-31 NOTE — Progress Notes (Signed)
08/31/2013 Julie Rich [id number] December 05, 1921   History of Present Illness: Patient is a pleasant 80 year old female who presents to our office today with her daughter for complaints of abdominal pain.  Her medical history includes  COPD/asthma and CHF and is O2 dependent, HTN, atrial fibrillation on xarelto, HLD, hypothyoidism, history of stroke, anxiety, arthritis, and GERD.  Was just discharged from the hospital in February for COPD exacerbation and atrial fib with RVR.  Has been having pain in her upper abdomen for about the past 4 months or so.  It gets worse throughout the day, usually in the evening and then come worse at night waking her from sleep.  She is currently taking omeprazole once daily and pepcid prn.  Had CT scan of the abdomen and pelvis with and without contrast in 03/2013 that showed multiple renal cysts, hepatic cysts, sigmoid diverticulosis, DJD of the spine, and atherosclerosis.  She was having similar pain at the time of this CT scan and had actually been seen in our office in November with these complaints as well and due to no other correlating GI symptoms it was not thought to be GI in origin and was treated as musculoskeletal.  Tried with Salon Pas patches and low dose Valium for a muscle relaxer, cautiously.  She had also tried flexeril and tramadol prior to the visit in our office as well.  She has not followed up with GI since that time until now.  She still does not seem to have any other correlating GI symptoms.  Weight is stable.   Current Medications, Allergies, Past Medical History, Past Surgical History, Family History and Social History were reviewed in [facility] electronic medical record.   Physical Exam: BP 142/70  Pulse 76  Ht 5' (1.524 m)  Wt 111 lb 9.6 oz (50.621 kg)  BMI 21.80 kg/m2 General:  Elderly white female in no acute distress Head: Normocephalic and atraumatic Eyes:  Sclerae anicteric, conjunctiva pink  Ears: Normal auditory  acuity Lungs: Decreased breath sounds B/L. Heart: Regular rate and rhythm Abdomen: Soft, non-distended.  Normal bowel sounds.  Non-tender. Musculoskeletal: Symmetrical with no gross deformities  Extremities: No edema  Neurological: Alert oriented x 4, grossly non-focal Psychological:  Alert and cooperative. Normal mood and affect  Assessment and Recommendations: -Abdominal pain:  In the epigastrium/mid-abdomen.  Will start with conservative measures for now including increasing omeprazole to twice daily and having her take pepcid at bedtime.  Will give her levsin to try prn for now as well.  Check stool for Hpylori.  Follow-up in approximately 6 weeks with Dr. Arlyce DiceKaplan.  She is high risk for any procedures given her age, O2 use, and multiple of co-morbidities.  Could consider repeat CT scan if these medication changes do not help.  *Patient's daughter had several questions regarding evaluation and treatment for her mother's complaints.  She was in agreement with and understood the plan.  Required at least 30 minutes for questions and answered them to their satisfaction.

## 2013-08-31 NOTE — Progress Notes (Signed)
Patient came in for labs only.

## 2013-09-01 LAB — HEPATIC FUNCTION PANEL
ALBUMIN: 4.1 g/dL (ref 3.5–4.7)
ALT: 7 IU/L (ref 0–32)
AST: 14 IU/L (ref 0–40)
Alkaline Phosphatase: 198 IU/L — ABNORMAL HIGH (ref 39–117)
Bilirubin, Direct: 0.07 mg/dL (ref 0.00–0.40)
Total Protein: 6.6 g/dL (ref 6.0–8.5)

## 2013-09-02 ENCOUNTER — Telehealth: Payer: Self-pay

## 2013-09-02 DIAGNOSIS — R109 Unspecified abdominal pain: Secondary | ICD-10-CM

## 2013-09-02 DIAGNOSIS — R945 Abnormal results of liver function studies: Principal | ICD-10-CM

## 2013-09-02 DIAGNOSIS — R7989 Other specified abnormal findings of blood chemistry: Secondary | ICD-10-CM

## 2013-09-02 NOTE — Telephone Encounter (Signed)
Patient is scheduled   

## 2013-09-02 NOTE — Progress Notes (Signed)
Note elevated alk phos.  Would repeat CT scan now.

## 2013-09-03 NOTE — Telephone Encounter (Signed)
Patient is scheduled for CT scan abd/pelvis for 09/10/13 1:30.  She will come for labs and pick up contrast next week

## 2013-09-06 ENCOUNTER — Telehealth: Payer: Self-pay | Admitting: Gastroenterology

## 2013-09-06 ENCOUNTER — Telehealth: Payer: Self-pay | Admitting: *Deleted

## 2013-09-06 NOTE — Telephone Encounter (Signed)
This has been scheduled already

## 2013-09-06 NOTE — Telephone Encounter (Signed)
Please let patient and her daughter know that the labs ordered by Dr. Waynard EdwardsPerini to check her liver function tests show that one of them is elevated and is a little higher elevation than what it was previously. Dr. Arlyce DiceKaplan saw this and would like to repeat a CT scan of the abdomen and pelvis with contrast. He understands that her symptoms are better and that is great, but he would still like to have the CT scan. Thank you, Jess

## 2013-09-06 NOTE — Telephone Encounter (Signed)
Left message for pt to call back.  Pts daughter called back and wanted to clarify instructions. [facility]. Daughter will pick up contrast from [place] [facility]. Pts appt is 09/10/13@1 :30pm. Pt to be NPO after 9:30am, drink bottle 1 of contrast at 11:30am, bottle 2 at 12:30pm. Pts daughter aware.

## 2013-09-07 ENCOUNTER — Other Ambulatory Visit (INDEPENDENT_AMBULATORY_CARE_PROVIDER_SITE_OTHER): Payer: Medicare Other

## 2013-09-07 DIAGNOSIS — R945 Abnormal results of liver function studies: Principal | ICD-10-CM

## 2013-09-07 DIAGNOSIS — R799 Abnormal finding of blood chemistry, unspecified: Secondary | ICD-10-CM

## 2013-09-07 DIAGNOSIS — R7989 Other specified abnormal findings of blood chemistry: Secondary | ICD-10-CM

## 2013-09-07 DIAGNOSIS — R109 Unspecified abdominal pain: Secondary | ICD-10-CM

## 2013-09-08 LAB — BMP8+EGFR
BUN / CREAT RATIO: 16 (ref 11–26)
BUN: 19 mg/dL (ref 8–27)
CO2: 24 mmol/L (ref 18–29)
CREATININE: 1.22 mg/dL — AB (ref 0.57–1.00)
Calcium: 9.1 mg/dL (ref 8.7–10.3)
Chloride: 100 mmol/L (ref 97–108)
GFR calc non Af Amer: 39 mL/min/{1.73_m2} — ABNORMAL LOW (ref >59)
GFR, EST AFRICAN AMERICAN: 45 mL/min/{1.73_m2} — AB (ref >59)
Glucose: 99 mg/dL (ref 65–99)
Potassium: 4.8 mmol/L (ref 3.5–5.2)
Sodium: 141 mmol/L (ref 134–144)

## 2013-09-10 ENCOUNTER — Ambulatory Visit (INDEPENDENT_AMBULATORY_CARE_PROVIDER_SITE_OTHER)
Admission: RE | Admit: 2013-09-10 | Discharge: 2013-09-10 | Disposition: A | Discharge status: Home / Self Care | Payer: Medicare Other | Source: Ambulatory Visit | Attending: Gastroenterology | Admitting: Gastroenterology

## 2013-09-10 ENCOUNTER — Encounter (INDEPENDENT_AMBULATORY_CARE_PROVIDER_SITE_OTHER): Payer: Self-pay

## 2013-09-10 DIAGNOSIS — R945 Abnormal results of liver function studies: Principal | ICD-10-CM

## 2013-09-10 DIAGNOSIS — R109 Unspecified abdominal pain: Secondary | ICD-10-CM

## 2013-09-10 DIAGNOSIS — R7989 Other specified abnormal findings of blood chemistry: Secondary | ICD-10-CM

## 2013-09-10 MED ORDER — IOHEXOL 300 MG/ML  SOLN
100.0000 mL | Freq: Once | INTRAMUSCULAR | Status: AC | PRN
  Administered 2013-09-10: 80 mL via INTRAVENOUS

## 2013-09-13 LAB — PROTEIN ELECTROPHORESIS
A/G Ratio: 1.3 (ref 0.7–2.0)
ALPHA 1: 0.2 g/dL (ref 0.1–0.4)
Albumin ELP: 3.9 g/dL (ref 3.2–5.6)
Alpha 2: 0.7 g/dL (ref 0.4–1.2)
BETA: 1 g/dL (ref 0.6–1.3)
GAMMA GLOBULIN: 0.9 g/dL (ref 0.5–1.6)
Globulin, Total: 2.9 g/dL (ref 2.0–4.5)
M-Spike, %: 0.5 g/dL — ABNORMAL HIGH
Total Protein: 6.8 g/dL (ref 6.0–8.5)

## 2013-09-13 LAB — HEPATIC FUNCTION PANEL
ALK PHOS: 190 IU/L — AB (ref 39–117)
ALT: 10 IU/L (ref 0–32)
AST: 14 IU/L (ref 0–40)
Albumin: 4.4 g/dL (ref 3.5–4.7)
Bilirubin, Direct: 0.08 mg/dL (ref 0.00–0.40)
Total Bilirubin: <0.2 mg/dL (ref 0.0–1.2)

## 2013-09-13 NOTE — Progress Notes (Signed)
Quick Note:  Please inform the patient that there were no significant changes on the CT scan ______

## 2013-09-24 ENCOUNTER — Ambulatory Visit: Payer: Medicare Other | Admitting: Gastroenterology

## 2013-10-05 ENCOUNTER — Ambulatory Visit: Payer: Medicare Other | Admitting: Gastroenterology

## 2013-10-15 ENCOUNTER — Ambulatory Visit (INDEPENDENT_AMBULATORY_CARE_PROVIDER_SITE_OTHER): Payer: Medicare Other | Admitting: Gastroenterology

## 2013-10-15 ENCOUNTER — Encounter: Payer: Self-pay | Admitting: Gastroenterology

## 2013-10-15 VITALS — BP 110/64 | HR 67 | Wt 108.0 lb

## 2013-10-15 DIAGNOSIS — I6350 Cerebral infarction due to unspecified occlusion or stenosis of unspecified cerebral artery: Secondary | ICD-10-CM

## 2013-10-15 DIAGNOSIS — R109 Unspecified abdominal pain: Secondary | ICD-10-CM

## 2013-10-15 MED ORDER — METHYLPREDNISOLONE (PAK) 4 MG PO TABS: ORAL_TABLET | ORAL | Status: DC

## 2013-10-15 NOTE — Assessment & Plan Note (Signed)
Patient continues to have fairly constant abdominal pain that appears to be unrelated to the digestive system.  Symptoms are not affected by eating or bowel movements.  She has no  new GI complaints.  I suspect that pain is due to abdominal wall pain or musculoskeletal pain.  Doubt radicular pain.  Recommendations #1 trial of steroids - Medrol dosepak #2 if steroids are not helpful I would consider therapy for chronic abdominal pain such as Elavil

## 2013-10-15 NOTE — Progress Notes (Signed)
          History of Present Illness:  Mrs. Deasy continues to complain of periumbilical pain.  Pain is constant and appears to be worsening.  She may have pain even when lying down but seems to worsen with sitting up or with standing.  It is unaffected by eating or bowel movements.  She's been on H2 receptor antagonists and PPIs in the past without relief.  She's had pain for over 2 years.  CT scan in April, 2015 was unrevealing for source for pain.  Lab work is equally unrevealing    Review of Systems: Pertinent positive and negative review of systems were noted in the above HPI section. All other review of systems were otherwise negative.    Current Medications, Allergies, Past Medical History, Past Surgical History, Family History and Social History were reviewed in [employer] electronic medical record  Vital signs were reviewed in today's medical record. Physical Exam: General: Elderly well nourished, no acute distress Skin: anicteric Head: Normocephalic and atraumatic Eyes:  sclerae anicteric, EOMI Ears: Normal auditory acuity Mouth: No deformity or lesions Lungs: Clear throughout to auscultation Heart: Regular rate and rhythm; no murmurs, rubs or bruits Abdomen: Soft, non tender and non distended. No masses, hepatosplenomegaly or hernias noted. Normal Bowel sounds Rectal:deferred Musculoskeletal: Symmetrical with no gross deformities  Pulses:  Normal pulses noted Extremities: No clubbing, cyanosis, edema or deformities noted Neurological: Alert oriented x 4, grossly nonfocal Psychological:  Alert and cooperative. Normal mood and affect  See Assessment and Plan under Problem List

## 2013-10-15 NOTE — Patient Instructions (Signed)
Please call in 1 week to report on symptoms.  We have sent  medications to your pharmacy for you to pick up at your convenience. CC:  Rudi Heap MD

## 2013-10-22 ENCOUNTER — Telehealth: Payer: Self-pay | Admitting: Gastroenterology

## 2013-10-22 NOTE — Telephone Encounter (Signed)
Spoke with patient's daughter and she is calling with 1 week update on patient. She did the Prednisone taper and it did not help her abdominal pain. She also tried taking a Valium tablet that she had and this just made it worse.

## 2013-10-22 NOTE — Telephone Encounter (Signed)
Begin Elavil 10 mg each bedtime for 3 days then increase to 25 mg each bedtime patient should return in approximately 3-4 weeks

## 2013-10-22 NOTE — Telephone Encounter (Signed)
Dr. Arlyce Dice, you have no OV until the last week of July. Please, advise.

## 2013-10-25 ENCOUNTER — Telehealth: Payer: Self-pay | Admitting: Gastroenterology

## 2013-10-25 NOTE — Telephone Encounter (Signed)
Dr. Arlyce Dice, I get a high interaction between Elavil and Tramadol. May cause seizures. Also, you do not have any OV until end of July. Please, advise.

## 2013-10-25 NOTE — Telephone Encounter (Signed)
Patient's daughter called in and is asking if patient can stop Tramadol(it is not helping) and try the Elavil. Please, advise.

## 2013-10-26 MED ORDER — AMITRIPTYLINE HCL 10 MG PO TABS: ORAL_TABLET | ORAL | Status: DC

## 2013-10-26 MED ORDER — AMITRIPTYLINE HCL 25 MG PO TABS: ORAL_TABLET | ORAL | Status: DC

## 2013-10-26 NOTE — Telephone Encounter (Signed)
Spoke with Ms. Webster and gave her Dr. Marzetta Board recommendations. She will try this and call back in 2 weeks with update.

## 2013-10-26 NOTE — Telephone Encounter (Signed)
Rx sent to pharmacy. Left a message at [phone] for Ms. Webster to call me.

## 2013-10-26 NOTE — Telephone Encounter (Signed)
yes

## 2013-10-26 NOTE — Telephone Encounter (Signed)
Let's hold tramadol while taking elavil.  C/b in 2 weeks

## 2013-11-09 ENCOUNTER — Telehealth: Payer: Self-pay | Admitting: Gastroenterology

## 2013-11-09 NOTE — Telephone Encounter (Signed)
Patients daughter states the Elavil isn't helping. She also states that when the pt is laying down she doesnt have abd pain only when she stands up. She would like to know if there is a gurdle or something she can use to help her. She says she will be out until 4:30pm call after that.

## 2013-11-10 NOTE — Telephone Encounter (Signed)
Pts daughter states that the elavil is not helping. States when pt lays down there is no pain, only when she is standing. States she is interested in a compression garment that may help. Discussed with her she can check with medical supply store or perhaps online for something that may help. States she will look.  Pts daughter wants to know if there is something else they need to see Dr. Arlyce DiceKaplan for or should she just follow-up with her PCP. Please advise.

## 2013-11-15 NOTE — Telephone Encounter (Signed)
Discontinue Elavil.  Okay to use a compression garment.  Followup with PCP

## 2013-11-15 NOTE — Telephone Encounter (Signed)
Left message for pt to call back  °

## 2013-11-16 NOTE — Telephone Encounter (Signed)
Pts daughter aware. States the compression garment did not work.

## 2014-02-15 ENCOUNTER — Ambulatory Visit (INDEPENDENT_AMBULATORY_CARE_PROVIDER_SITE_OTHER): Payer: Medicare Other | Admitting: Family Medicine

## 2014-02-15 ENCOUNTER — Encounter: Payer: Self-pay | Admitting: Family Medicine

## 2014-02-15 ENCOUNTER — Other Ambulatory Visit: Payer: Self-pay | Admitting: Internal Medicine

## 2014-02-15 VITALS — BP 151/66 | HR 75 | Temp 96.8°F | Ht 61.0 in | Wt 112.8 lb

## 2014-02-15 DIAGNOSIS — I6350 Cerebral infarction due to unspecified occlusion or stenosis of unspecified cerebral artery: Secondary | ICD-10-CM

## 2014-02-15 DIAGNOSIS — J069 Acute upper respiratory infection, unspecified: Secondary | ICD-10-CM

## 2014-02-15 DIAGNOSIS — M549 Dorsalgia, unspecified: Secondary | ICD-10-CM

## 2014-02-15 MED ORDER — METHYLPREDNISOLONE ACETATE 80 MG/ML IJ SUSP
80.0000 mg | Freq: Once | INTRAMUSCULAR | Status: AC
  Administered 2014-02-15: 80 mg via INTRAMUSCULAR

## 2014-02-15 MED ORDER — AZITHROMYCIN 250 MG PO TABS: ORAL_TABLET | ORAL | Status: DC

## 2014-02-15 NOTE — Progress Notes (Signed)
   Subjective:    Patient ID: Julie Rich, female    DOB: [birth year], 80 y.o.   MRN: [id number]  HPI This 80 y.o. female presents for evaluation of URI sx's and ear discomfort.   Review of Systems    No chest pain, SOB, HA, dizziness, vision change, N/V, diarrhea, constipation, dysuria, urinary urgency or frequency, myalgias, arthralgias or rash.  Objective:   Physical Exam  Vital signs noted  Well developed well nourished female.  HEENT - Head atraumatic Normocephalic                Eyes - PERRLA, Conjuctiva - clear Sclera- Clear EOMI                Ears - EAC's Wnl TM's Wnl Gross Hearing WNL                Nose - Nares patent                 Throat - oropharanx wnl Respiratory - Lungs CTA bilateral Cardiac - RRR S1 and S2 without murmur GI - Abdomen soft Nontender and bowel sounds active x 4 Extremities - No edema. Neuro - Grossly intact.      Assessment & Plan:  URI (upper respiratory infection) - Plan: azithromycin (ZITHROMAX) 250 MG tablet, methylPREDNISolone acetate (DEPO-MEDROL) injection 80 mg  Push po fluids, rest, tylenol and motrin otc prn as directed for fever, arthralgias, and myalgias.  Follow up prn if sx's continue or persist.  Deatra Canter FNP

## 2014-02-24 ENCOUNTER — Ambulatory Visit
Admission: RE | Admit: 2014-02-24 | Discharge: 2014-02-24 | Disposition: A | Discharge status: Home / Self Care | Payer: Medicare Other | Source: Ambulatory Visit | Attending: Internal Medicine | Admitting: Internal Medicine

## 2014-02-24 DIAGNOSIS — M549 Dorsalgia, unspecified: Secondary | ICD-10-CM

## 2014-03-13 ENCOUNTER — Emergency Department (HOSPITAL_COMMUNITY): Payer: Medicare Other

## 2014-03-13 ENCOUNTER — Observation Stay (HOSPITAL_COMMUNITY)
Admission: EM | Admit: 2014-03-13 | Discharge: 2014-03-15 | Disposition: A | Discharge status: Home / Self Care | Payer: Medicare Other | Attending: Internal Medicine | Admitting: Internal Medicine

## 2014-03-13 DIAGNOSIS — E039 Hypothyroidism, unspecified: Secondary | ICD-10-CM | POA: Diagnosis not present

## 2014-03-13 DIAGNOSIS — I10 Essential (primary) hypertension: Secondary | ICD-10-CM | POA: Diagnosis not present

## 2014-03-13 DIAGNOSIS — I5033 Acute on chronic diastolic (congestive) heart failure: Secondary | ICD-10-CM

## 2014-03-13 DIAGNOSIS — I499 Cardiac arrhythmia, unspecified: Secondary | ICD-10-CM | POA: Diagnosis not present

## 2014-03-13 DIAGNOSIS — R0789 Other chest pain: Secondary | ICD-10-CM | POA: Diagnosis not present

## 2014-03-13 DIAGNOSIS — R059 Cough, unspecified: Secondary | ICD-10-CM

## 2014-03-13 DIAGNOSIS — R1319 Other dysphagia: Secondary | ICD-10-CM | POA: Insufficient documentation

## 2014-03-13 DIAGNOSIS — F419 Anxiety disorder, unspecified: Secondary | ICD-10-CM | POA: Diagnosis not present

## 2014-03-13 DIAGNOSIS — J441 Chronic obstructive pulmonary disease with (acute) exacerbation: Secondary | ICD-10-CM | POA: Insufficient documentation

## 2014-03-13 DIAGNOSIS — I509 Heart failure, unspecified: Secondary | ICD-10-CM | POA: Insufficient documentation

## 2014-03-13 DIAGNOSIS — Z79899 Other long term (current) drug therapy: Secondary | ICD-10-CM | POA: Diagnosis not present

## 2014-03-13 DIAGNOSIS — M1990 Unspecified osteoarthritis, unspecified site: Secondary | ICD-10-CM | POA: Insufficient documentation

## 2014-03-13 DIAGNOSIS — I4819 Other persistent atrial fibrillation: Secondary | ICD-10-CM

## 2014-03-13 DIAGNOSIS — Z8709 Personal history of other diseases of the respiratory system: Secondary | ICD-10-CM

## 2014-03-13 DIAGNOSIS — R079 Chest pain, unspecified: Secondary | ICD-10-CM | POA: Diagnosis present

## 2014-03-13 DIAGNOSIS — Z7901 Long term (current) use of anticoagulants: Secondary | ICD-10-CM

## 2014-03-13 DIAGNOSIS — Z8673 Personal history of transient ischemic attack (TIA), and cerebral infarction without residual deficits: Secondary | ICD-10-CM | POA: Diagnosis not present

## 2014-03-13 DIAGNOSIS — E785 Hyperlipidemia, unspecified: Secondary | ICD-10-CM | POA: Insufficient documentation

## 2014-03-13 DIAGNOSIS — I5032 Chronic diastolic (congestive) heart failure: Secondary | ICD-10-CM | POA: Diagnosis present

## 2014-03-13 DIAGNOSIS — K219 Gastro-esophageal reflux disease without esophagitis: Secondary | ICD-10-CM | POA: Diagnosis not present

## 2014-03-13 DIAGNOSIS — R109 Unspecified abdominal pain: Secondary | ICD-10-CM | POA: Diagnosis present

## 2014-03-13 DIAGNOSIS — R1310 Dysphagia, unspecified: Secondary | ICD-10-CM

## 2014-03-13 DIAGNOSIS — R05 Cough: Secondary | ICD-10-CM

## 2014-03-13 DIAGNOSIS — I482 Chronic atrial fibrillation, unspecified: Secondary | ICD-10-CM

## 2014-03-13 DIAGNOSIS — I4891 Unspecified atrial fibrillation: Secondary | ICD-10-CM | POA: Diagnosis not present

## 2014-03-13 LAB — CBC WITH DIFFERENTIAL/PLATELET
Basophils Absolute: 0 10*3/uL (ref 0.0–0.1)
Basophils Relative: 0 % (ref 0–1)
Eosinophils Absolute: 0.2 10*3/uL (ref 0.0–0.7)
Eosinophils Relative: 2 % (ref 0–5)
HEMATOCRIT: 38.9 % (ref 36.0–46.0)
Hemoglobin: 12 g/dL (ref 12.0–15.0)
Lymphocytes Relative: 10 % — ABNORMAL LOW (ref 12–46)
Lymphs Abs: 0.8 10*3/uL (ref 0.7–4.0)
MCH: 28.2 pg (ref 26.0–34.0)
MCHC: 30.8 g/dL (ref 30.0–36.0)
MCV: 91.5 fL (ref 78.0–100.0)
MONO ABS: 0.8 10*3/uL (ref 0.1–1.0)
Monocytes Relative: 10 % (ref 3–12)
NEUTROS ABS: 6.6 10*3/uL (ref 1.7–7.7)
Neutrophils Relative %: 78 % — ABNORMAL HIGH (ref 43–77)
Platelets: 209 10*3/uL (ref 150–400)
RBC: 4.25 MIL/uL (ref 3.87–5.11)
RDW: 16.1 % — AB (ref 11.5–15.5)
WBC: 8.4 10*3/uL (ref 4.0–10.5)

## 2014-03-13 LAB — COMPREHENSIVE METABOLIC PANEL
ALK PHOS: 93 U/L (ref 39–117)
ALT: 11 U/L (ref 0–35)
AST: 11 U/L (ref 0–37)
Albumin: 3.2 g/dL — ABNORMAL LOW (ref 3.5–5.2)
Anion gap: 7 (ref 5–15)
BUN: 33 mg/dL — ABNORMAL HIGH (ref 6–23)
CO2: 30 meq/L (ref 19–32)
CREATININE: 1.42 mg/dL — AB (ref 0.50–1.10)
Calcium: 8.7 mg/dL (ref 8.4–10.5)
Chloride: 104 mEq/L (ref 96–112)
GFR calc Af Amer: 36 mL/min — ABNORMAL LOW (ref >90)
GFR, EST NON AFRICAN AMERICAN: 31 mL/min — AB (ref >90)
Glucose, Bld: 110 mg/dL — ABNORMAL HIGH (ref 70–99)
Potassium: 4.8 mEq/L (ref 3.7–5.3)
SODIUM: 141 meq/L (ref 137–147)
Total Protein: 6.3 g/dL (ref 6.0–8.3)

## 2014-03-13 LAB — TROPONIN I: Troponin I: <0.3 ng/mL (ref <0.30)

## 2014-03-13 LAB — PROTIME-INR
INR: 1.33 (ref 0.00–1.49)
Prothrombin Time: 16.6 seconds — ABNORMAL HIGH (ref 11.6–15.2)

## 2014-03-13 MED ORDER — FENTANYL CITRATE 0.05 MG/ML IJ SOLN
50.0000 ug | Freq: Once | INTRAMUSCULAR | Status: AC
  Administered 2014-03-13: 50 ug via INTRAVENOUS
  Filled 2014-03-13: qty 2

## 2014-03-13 MED ORDER — ASPIRIN 325 MG PO TABS
325.0000 mg | ORAL_TABLET | ORAL | Status: DC
  Filled 2014-03-13: qty 1

## 2014-03-13 NOTE — ED Provider Notes (Signed)
CSN: [id number]     Arrival date & time 03/13/14  2203 History   First MD Initiated Contact with Patient 03/13/14 2207     Chief Complaint  Patient presents with  . Chest Pain     (Consider location/radiation/quality/duration/timing/severity/associated sxs/prior Treatment) Patient is a 80 y.o. female presenting with chest pain. The history is provided by the patient.  Chest Pain Pain location:  Substernal area Pain quality: pressure   Pain radiates to:  Does not radiate Pain radiates to the back: no   Pain severity:  Moderate Onset quality:  Sudden Duration:  2 hours Timing:  Constant Progression:  Unchanged Chronicity:  New Context: at rest   Relieved by:  Nothing Worsened by:  Nothing tried Ineffective treatments:  None tried Associated symptoms: cough and shortness of breath   Associated symptoms: no abdominal pain, no back pain, no dizziness, no fatigue, no fever, no headache, no nausea and not vomiting     Past Medical History  Diagnosis Date  . HTN (hypertension)   . A-fib   . Dyslipidemia   . Hypothyroidism   . Stroke   . Asthma   . Shortness of breath   . CHF (congestive heart failure)   . GERD (gastroesophageal reflux disease)   . Anxiety   . COPD (chronic obstructive pulmonary disease)   . Dysrhythmia   . Arthritis    Past Surgical History  Procedure Laterality Date  . Total abdominal hysterectomy    . Breast surgery      45-50 YEARS AGO LUMP REMOVED  . Appendectomy    . Cardioversion  06/26/2011    Procedure: CARDIOVERSION;  Surgeon: Marca Anconaalton McLean, MD;  [facility];  Service: Cardiovascular;  Laterality: N/A;  . Tee without cardioversion N/A 07/24/2012    Procedure: TRANSESOPHAGEAL ECHOCARDIOGRAM (TEE);  Surgeon: Pricilla RifflePaula V Ross, MD;  [facility];  Service: Cardiovascular;  Laterality: N/A;  . Cardioversion N/A 07/24/2012    Procedure: CARDIOVERSION;  Surgeon: Pricilla RifflePaula V Ross, MD;  [facility];  Service: Cardiovascular;   Laterality: N/A;  . Cardioversion N/A 09/14/2012    Procedure: CARDIOVERSION;  Surgeon: Pricilla RifflePaula V Ross, MD;  [facility];  Service: Cardiovascular;  Laterality: N/A;  . Tonsillectomy    . Eye surgery      removed cataract in both eyes 5 years ago  . Tubal ligation     Family History  Problem Relation Age of Onset  . Coronary artery disease Neg Hx   . Stroke Brother   . Breast cancer Mother    History  Substance Use Topics  . Smoking status: Never Smoker   . Smokeless tobacco: Never Used     Comment: does not smoke   . Alcohol Use: No   OB History   Grav Para Term Preterm Abortions TAB SAB Ect Mult Living                 Review of Systems  Constitutional: Negative for fever and fatigue.  HENT: Negative for congestion and drooling.   Eyes: Negative for pain.  Respiratory: Positive for cough and shortness of breath.   Cardiovascular: Positive for chest pain.  Gastrointestinal: Negative for nausea, vomiting, abdominal pain and diarrhea.  Genitourinary: Negative for dysuria and hematuria.  Musculoskeletal: Negative for back pain, gait problem and neck pain.  Skin: Negative for color change.  Neurological: Negative for dizziness and headaches.  Hematological: Negative for adenopathy.  Psychiatric/Behavioral: Negative for behavioral problems.  All other systems reviewed and are  negative.     Allergies  Ciprofloxacin; Quinolones; Sulfonamide derivatives; and Zolpidem tartrate  Home Medications   Prior to Admission medications   Medication Sig Start Date End Date Taking? Authorizing Provider  acetaminophen (TYLENOL) 325 MG tablet Take 2 tablets (650 mg total) by mouth every 6 (six) hours as needed for mild pain (or Fever >/= 101). 07/08/13   Christiane Haorinna L Sullivan, MD  albuterol (PROAIR HFA) 108 (90 BASE) MCG/ACT inhaler Inhale 2 puffs into the lungs every 6 (six) hours as needed for wheezing or shortness of breath.    Historical Provider, MD  amitriptyline (ELAVIL) 10  MG tablet Take one nightly x 3 days then take 25 mg daily 10/26/13   Louis Meckelobert D Kaplan, MD  amitriptyline (ELAVIL) 25 MG tablet Take 10 mg nightly x 3 days then take one 25mg  tablet nightly 10/26/13   Louis Meckelobert D Kaplan, MD  azithromycin (ZITHROMAX) 250 MG tablet Take 2 po first day and then one po qd x 4 days 02/15/14   Deatra CanterWilliam J Oxford, FNP  cholecalciferol (VITAMIN D) 1000 UNITS tablet Take 1,000 Units by mouth daily.    Historical Provider, MD  digoxin 62.5 MCG TABS Take 0.0625 mg by mouth daily. 07/08/13   Christiane Haorinna L Sullivan, MD  diltiazem (CARDIZEM CD) 360 MG 24 hr capsule Take 1 capsule (360 mg total) by mouth daily. 07/08/13   Christiane Haorinna L Sullivan, MD  Fluticasone-Salmeterol (ADVAIR DISKUS) 250-50 MCG/DOSE AEPB Inhale 2 puffs into the lungs 2 (two) times daily.    Historical Provider, MD  furosemide (LASIX) 20 MG tablet Take 20 mg by mouth daily.    Historical Provider, MD  guaiFENesin (MUCINEX) 600 MG 12 hr tablet Take 2 tablets (1,200 mg total) by mouth 2 (two) times daily as needed for cough. 07/08/13   Christiane Haorinna L Sullivan, MD  levothyroxine (SYNTHROID, LEVOTHROID) 50 MCG tablet Take 50-75 mcg by mouth daily before breakfast. Take one tablet (50mcg) by mouth every day except Sunday and Wednesday, take 1 1/2 tablets (75mcg)    Historical Provider, MD  OXYGEN-HELIUM IN Inhale into the lungs daily. 2 Liters    Historical Provider, MD  potassium chloride (KLOR-CON) 8 MEQ tablet Take 16 mEq by mouth daily.    Historical Provider, MD  Rivaroxaban (XARELTO) 15 MG TABS tablet Take 15 mg by mouth daily with supper.  08/24/12   Rollene RotundaJames Hochrein, MD  senna-docusate (SENOKOT-S) 8.6-50 MG per tablet Take 2 tablets by mouth at bedtime. 07/03/12   Kathlen ModyVijaya Akula, MD  temazepam (RESTORIL) 15 MG capsule Take 15 mg by mouth at bedtime. 08/03/12   Rhonda G Barrett, PA-C  tiotropium (SPIRIVA) 18 MCG inhalation capsule Place 18 mcg into inhaler and inhale daily. Inhale 2 puffs from 1 capsule    Historical Provider, MD   BP 161/67   Pulse 82  Resp 20  SpO2 100% Physical Exam  Nursing note and vitals reviewed. Constitutional: She is oriented to person, place, and time. She appears well-developed and well-nourished.  HENT:  Head: Normocephalic and atraumatic.  Mouth/Throat: Oropharynx is clear and moist. No oropharyngeal exudate.  Eyes: Conjunctivae and EOM are normal. Pupils are equal, round, and reactive to light.  Neck: Normal range of motion. Neck supple.  Cardiovascular: Normal rate, regular rhythm, normal heart sounds and intact distal pulses.  Exam reveals no gallop and no friction rub.   No murmur heard. Pulmonary/Chest: Effort normal and breath sounds normal. No respiratory distress. She has no wheezes.  Abdominal: Soft. Bowel sounds are normal. There is  no tenderness. There is no rebound and no guarding.  Musculoskeletal: Normal range of motion. She exhibits no edema and no tenderness.  Neurological: She is alert and oriented to person, place, and time.  Skin: Skin is warm and dry.  Psychiatric: She has a normal mood and affect. Her behavior is normal.    ED Course  Procedures (including critical care time) Labs Review Labs Reviewed  CBC WITH DIFFERENTIAL - Abnormal; Notable for the following:    RDW 16.1 (*)    Neutrophils Relative % 78 (*)    Lymphocytes Relative 10 (*)    All other components within normal limits  COMPREHENSIVE METABOLIC PANEL - Abnormal; Notable for the following:    Glucose, Bld 110 (*)    BUN 33 (*)    Creatinine, Ser 1.42 (*)    Albumin 3.2 (*)    Total Bilirubin <0.2 (*)    GFR calc non Af Amer 31 (*)    GFR calc Af Amer 36 (*)    All other components within normal limits  TROPONIN I  PROTIME-INR    Imaging Review Dg Chest Port 1 View  03/13/2014   CLINICAL DATA:  Sudden onset chest pain. History of atrial fibrillation and COPD.  EXAM: PORTABLE CHEST - 1 VIEW  COMPARISON:  07/03/2013  FINDINGS: Borderline heart size with normal pulmonary vascularity. Emphysematous  changes in the lungs. No focal airspace disease. Vague nodular opacity over the left mid lung measures 7 mm diameter. Followup elective CT is suggested for further evaluation. Calcified and tortuous aorta.  IMPRESSION: Emphysematous changes in the lungs. No evidence of active pulmonary disease. Vague 7 mm nodule projected over the left mid lung.   Electronically Signed   By: Burman Nieves M.D.   On: 03/13/2014 22:47     EKG Interpretation   Date/Time:  Sunday March 13 2014 22:20:37 EDT Ventricular Rate:  86 PR Interval:    QRS Duration: 87 QT Interval:  331 QTC Calculation: 396 R Axis:   -34 Text Interpretation:  Atrial fibrillation Left axis deviation Anteroseptal  infarct, age indeterminate No significant change since last tracing  Confirmed by Sumayya Muha  MD, Shreyansh Tiffany ([id number]) on 03/13/2014 11:42:03 PM      MDM   Final diagnoses:  Chest pain    11:25 PM 80 y.o. female w a hx of htn, afib on xarelto, chf, copd who presents with chest pressure which began while she was eating around 7 PM this evening. The pain has persisted since that time. She has a history of COPD is on 3 L via nasal pain at baseline. She has a chronic cough productive of white sputum which is not significantly changed from baseline. She is afebrile and vital signs are unremarkable here. She refused aspirin due to the fact that she was  Already on Xarelto. She got 3 nitroglycerin in route which did not significantly help her pain. She currently complains of 6/10 chest pain. Will give fentanyl.   Will admit to hospitalist.   Purvis Sheffield, MD 03/14/14 0002

## 2014-03-13 NOTE — ED Notes (Signed)
Patient arrived via [facility] from home with chest pain that started while she was eating dinner tonight. She describes it as chest pressure with no radiating pain. Denies N/V or diaphoresis. Patient has been being treated for respiratory infections with antibiotics. EMS administered NTG x1 without relief. EKG shows Afib that patient has a history of.

## 2014-03-13 NOTE — ED Notes (Addendum)
Patient placed in gown, hooked up to monitor and ekg done, call light in reach and stretcher in lowest position.

## 2014-03-14 ENCOUNTER — Encounter (HOSPITAL_COMMUNITY): Payer: Self-pay | Admitting: Internal Medicine

## 2014-03-14 DIAGNOSIS — I10 Essential (primary) hypertension: Secondary | ICD-10-CM | POA: Diagnosis not present

## 2014-03-14 DIAGNOSIS — R1310 Dysphagia, unspecified: Secondary | ICD-10-CM

## 2014-03-14 DIAGNOSIS — Z8709 Personal history of other diseases of the respiratory system: Secondary | ICD-10-CM

## 2014-03-14 DIAGNOSIS — I482 Chronic atrial fibrillation, unspecified: Secondary | ICD-10-CM | POA: Diagnosis present

## 2014-03-14 DIAGNOSIS — R05 Cough: Secondary | ICD-10-CM

## 2014-03-14 DIAGNOSIS — Z7901 Long term (current) use of anticoagulants: Secondary | ICD-10-CM

## 2014-03-14 DIAGNOSIS — I481 Persistent atrial fibrillation: Secondary | ICD-10-CM

## 2014-03-14 DIAGNOSIS — E039 Hypothyroidism, unspecified: Secondary | ICD-10-CM | POA: Diagnosis present

## 2014-03-14 DIAGNOSIS — I359 Nonrheumatic aortic valve disorder, unspecified: Secondary | ICD-10-CM

## 2014-03-14 DIAGNOSIS — K219 Gastro-esophageal reflux disease without esophagitis: Secondary | ICD-10-CM | POA: Diagnosis not present

## 2014-03-14 DIAGNOSIS — R0789 Other chest pain: Secondary | ICD-10-CM | POA: Diagnosis not present

## 2014-03-14 DIAGNOSIS — I5033 Acute on chronic diastolic (congestive) heart failure: Secondary | ICD-10-CM

## 2014-03-14 DIAGNOSIS — J441 Chronic obstructive pulmonary disease with (acute) exacerbation: Secondary | ICD-10-CM | POA: Diagnosis not present

## 2014-03-14 DIAGNOSIS — R079 Chest pain, unspecified: Secondary | ICD-10-CM

## 2014-03-14 LAB — COMPREHENSIVE METABOLIC PANEL
ALT: 10 U/L (ref 0–35)
ANION GAP: 8 (ref 5–15)
AST: 11 U/L (ref 0–37)
Albumin: 2.9 g/dL — ABNORMAL LOW (ref 3.5–5.2)
Alkaline Phosphatase: 88 U/L (ref 39–117)
BUN: 25 mg/dL — AB (ref 6–23)
CO2: 29 meq/L (ref 19–32)
Calcium: 8.5 mg/dL (ref 8.4–10.5)
Chloride: 106 mEq/L (ref 96–112)
Creatinine, Ser: 0.91 mg/dL (ref 0.50–1.10)
GFR calc non Af Amer: 54 mL/min — ABNORMAL LOW (ref >90)
GFR, EST AFRICAN AMERICAN: 62 mL/min — AB (ref >90)
GLUCOSE: 89 mg/dL (ref 70–99)
POTASSIUM: 4.7 meq/L (ref 3.7–5.3)
SODIUM: 143 meq/L (ref 137–147)
TOTAL PROTEIN: 5.7 g/dL — AB (ref 6.0–8.3)
Total Bilirubin: 0.3 mg/dL (ref 0.3–1.2)

## 2014-03-14 LAB — CBC WITH DIFFERENTIAL/PLATELET
BASOS ABS: 0 10*3/uL (ref 0.0–0.1)
Basophils Relative: 0 % (ref 0–1)
Eosinophils Absolute: 0.2 10*3/uL (ref 0.0–0.7)
Eosinophils Relative: 3 % (ref 0–5)
HCT: 39.3 % (ref 36.0–46.0)
Hemoglobin: 12 g/dL (ref 12.0–15.0)
Lymphocytes Relative: 13 % (ref 12–46)
Lymphs Abs: 0.9 10*3/uL (ref 0.7–4.0)
MCH: 28.6 pg (ref 26.0–34.0)
MCHC: 30.5 g/dL (ref 30.0–36.0)
MCV: 93.8 fL (ref 78.0–100.0)
Monocytes Absolute: 0.8 10*3/uL (ref 0.1–1.0)
Monocytes Relative: 11 % (ref 3–12)
NEUTROS ABS: 5.1 10*3/uL (ref 1.7–7.7)
NEUTROS PCT: 73 % (ref 43–77)
Platelets: 188 10*3/uL (ref 150–400)
RBC: 4.19 MIL/uL (ref 3.87–5.11)
RDW: 16 % — AB (ref 11.5–15.5)
WBC: 7 10*3/uL (ref 4.0–10.5)

## 2014-03-14 LAB — DIGOXIN LEVEL: Digoxin Level: 0.7 ng/mL — ABNORMAL LOW (ref 0.8–2.0)

## 2014-03-14 LAB — MAGNESIUM: Magnesium: 2.1 mg/dL (ref 1.5–2.5)

## 2014-03-14 LAB — TROPONIN I
Troponin I: <0.3 ng/mL (ref <0.30)
Troponin I: <0.3 ng/mL (ref <0.30)

## 2014-03-14 LAB — LIPASE, BLOOD: LIPASE: 56 U/L (ref 11–59)

## 2014-03-14 LAB — VITAMIN B12: Vitamin B-12: 388 pg/mL (ref 211–911)

## 2014-03-14 MED ORDER — ALBUTEROL SULFATE (2.5 MG/3ML) 0.083% IN NEBU: 2.5000 mg | INHALATION_SOLUTION | Freq: Four times a day (QID) | RESPIRATORY_TRACT | Status: DC | PRN

## 2014-03-14 MED ORDER — LEVOTHYROXINE SODIUM 50 MCG PO TABS: 50.0000 ug | ORAL_TABLET | Freq: Every day | ORAL | Status: DC

## 2014-03-14 MED ORDER — VITAMIN D3 25 MCG (1000 UNIT) PO TABS
1000.0000 [IU] | ORAL_TABLET | Freq: Every day | ORAL | Status: DC
Start: 2014-03-14 — End: 2014-03-15
  Administered 2014-03-14 – 2014-03-15 (×2): 1000 [IU] via ORAL
  Filled 2014-03-14 (×3): qty 1

## 2014-03-14 MED ORDER — ONDANSETRON HCL 4 MG PO TABS: 4.0000 mg | ORAL_TABLET | Freq: Four times a day (QID) | ORAL | Status: DC | PRN

## 2014-03-14 MED ORDER — LEVOTHYROXINE SODIUM 75 MCG PO TABS: 75.0000 ug | ORAL_TABLET | ORAL | Status: DC

## 2014-03-14 MED ORDER — SENNOSIDES-DOCUSATE SODIUM 8.6-50 MG PO TABS
2.0000 | ORAL_TABLET | Freq: Every day | ORAL | Status: DC
  Administered 2014-03-14 (×2): 2 via ORAL
  Filled 2014-03-14 (×3): qty 2

## 2014-03-14 MED ORDER — LORATADINE 10 MG PO TABS
10.0000 mg | ORAL_TABLET | Freq: Every day | ORAL | Status: DC
  Administered 2014-03-14 (×2): 10 mg via ORAL
  Filled 2014-03-14 (×3): qty 1

## 2014-03-14 MED ORDER — DIGOXIN 0.0625 MG HALF TABLET
0.0625 mg | ORAL_TABLET | Freq: Every day | ORAL | Status: DC
  Administered 2014-03-14 – 2014-03-15 (×2): 0.0625 mg via ORAL
  Filled 2014-03-14 (×2): qty 1

## 2014-03-14 MED ORDER — FENTANYL CITRATE 0.05 MG/ML IJ SOLN: 50.0000 ug | INTRAMUSCULAR | Status: DC | PRN

## 2014-03-14 MED ORDER — CETYLPYRIDINIUM CHLORIDE 0.05 % MT LIQD
7.0000 mL | Freq: Two times a day (BID) | OROMUCOSAL | Status: DC
  Administered 2014-03-14 – 2014-03-15 (×2): 7 mL via OROMUCOSAL

## 2014-03-14 MED ORDER — SODIUM CHLORIDE 0.9 % IJ SOLN: 3.0000 mL | Freq: Two times a day (BID) | INTRAMUSCULAR | Status: DC

## 2014-03-14 MED ORDER — STARCH (THICKENING) PO POWD
ORAL | Status: DC | PRN
  Filled 2014-03-14: qty 227

## 2014-03-14 MED ORDER — ONDANSETRON HCL 4 MG/2ML IJ SOLN: 4.0000 mg | Freq: Four times a day (QID) | INTRAMUSCULAR | Status: DC | PRN

## 2014-03-14 MED ORDER — RIVAROXABAN 15 MG PO TABS
15.0000 mg | ORAL_TABLET | Freq: Every day | ORAL | Status: DC
  Administered 2014-03-14: 15 mg via ORAL
  Filled 2014-03-14 (×2): qty 1

## 2014-03-14 MED ORDER — PANTOPRAZOLE SODIUM 40 MG PO TBEC
40.0000 mg | DELAYED_RELEASE_TABLET | Freq: Every day | ORAL | Status: DC
  Administered 2014-03-14: 40 mg via ORAL
  Filled 2014-03-14: qty 1

## 2014-03-14 MED ORDER — ISOSORBIDE MONONITRATE ER 30 MG PO TB24: 30.0000 mg | ORAL_TABLET | Freq: Every day | ORAL | Status: AC

## 2014-03-14 MED ORDER — LEVOTHYROXINE SODIUM 50 MCG PO TABS
50.0000 ug | ORAL_TABLET | ORAL | Status: DC
  Administered 2014-03-14 – 2014-03-15 (×2): 50 ug via ORAL
  Filled 2014-03-14 (×3): qty 1

## 2014-03-14 MED ORDER — ACETAMINOPHEN 325 MG PO TABS: 650.0000 mg | ORAL_TABLET | Freq: Four times a day (QID) | ORAL | Status: DC | PRN

## 2014-03-14 MED ORDER — GUAIFENESIN ER 600 MG PO TB12: 1200.0000 mg | ORAL_TABLET | Freq: Two times a day (BID) | ORAL | Status: DC | PRN

## 2014-03-14 MED ORDER — TIOTROPIUM BROMIDE MONOHYDRATE 18 MCG IN CAPS
18.0000 ug | ORAL_CAPSULE | Freq: Every day | RESPIRATORY_TRACT | Status: DC
  Administered 2014-03-14 – 2014-03-15 (×2): 18 ug via RESPIRATORY_TRACT
  Filled 2014-03-14: qty 5

## 2014-03-14 MED ORDER — DIAZEPAM 5 MG PO TABS
2.5000 mg | ORAL_TABLET | Freq: Four times a day (QID) | ORAL | Status: DC | PRN
  Administered 2014-03-14: 2.5 mg via ORAL
  Administered 2014-03-15: 5 mg via ORAL
  Filled 2014-03-14 (×2): qty 1

## 2014-03-14 MED ORDER — ACETAMINOPHEN 650 MG RE SUPP: 650.0000 mg | Freq: Four times a day (QID) | RECTAL | Status: DC | PRN

## 2014-03-14 MED ORDER — SODIUM CHLORIDE 0.9 % IJ SOLN
3.0000 mL | Freq: Two times a day (BID) | INTRAMUSCULAR | Status: DC
  Administered 2014-03-14 (×2): 3 mL via INTRAVENOUS

## 2014-03-14 MED ORDER — FUROSEMIDE 20 MG PO TABS
20.0000 mg | ORAL_TABLET | Freq: Every day | ORAL | Status: DC
  Administered 2014-03-14 – 2014-03-15 (×2): 20 mg via ORAL
  Filled 2014-03-14 (×2): qty 1

## 2014-03-14 MED ORDER — DILTIAZEM HCL ER COATED BEADS 360 MG PO CP24
360.0000 mg | ORAL_CAPSULE | Freq: Every day | ORAL | Status: DC
  Administered 2014-03-14 – 2014-03-15 (×2): 360 mg via ORAL
  Filled 2014-03-14 (×2): qty 1

## 2014-03-14 MED ORDER — ISOSORBIDE MONONITRATE ER 30 MG PO TB24
30.0000 mg | ORAL_TABLET | Freq: Every day | ORAL | Status: DC
  Administered 2014-03-14 – 2014-03-15 (×2): 30 mg via ORAL
  Filled 2014-03-14 (×2): qty 1

## 2014-03-14 MED ORDER — MOMETASONE FURO-FORMOTEROL FUM 100-5 MCG/ACT IN AERO
2.0000 | INHALATION_SPRAY | Freq: Two times a day (BID) | RESPIRATORY_TRACT | Status: DC
  Administered 2014-03-14 – 2014-03-15 (×3): 2 via RESPIRATORY_TRACT
  Filled 2014-03-14: qty 8.8

## 2014-03-14 MED ORDER — NITROGLYCERIN 0.4 MG SL SUBL: 0.4000 mg | SUBLINGUAL_TABLET | SUBLINGUAL | Status: DC | PRN

## 2014-03-14 MED ORDER — TRAMADOL HCL 50 MG PO TABS: 25.0000 mg | ORAL_TABLET | Freq: Four times a day (QID) | ORAL | Status: DC | PRN

## 2014-03-14 MED ORDER — OMEPRAZOLE 20 MG PO CPDR: 20.0000 mg | DELAYED_RELEASE_CAPSULE | Freq: Every day | ORAL | Status: AC

## 2014-03-14 MED ORDER — TEMAZEPAM 15 MG PO CAPS
15.0000 mg | ORAL_CAPSULE | Freq: Every day | ORAL | Status: DC
  Administered 2014-03-14 (×2): 15 mg via ORAL
  Filled 2014-03-14 (×2): qty 1

## 2014-03-14 NOTE — Progress Notes (Signed)
  Echocardiogram 2D Echocardiogram has been performed.  Julie Salvador FRANCES 03/14/2014, 2:17 PM

## 2014-03-14 NOTE — Plan of Care (Signed)
Problem: Phase I Progression Outcomes Goal: Aspirin unless contraindicated Outcome: Not Met (add Reason) Patient refused ASA in ER on admission day. Taking Xarelto for thromboembolic risk modification of atrial fibrillation. No problems reported or observed on current anticoagulant therapy. Antiplatelet medication not currently ordered.     

## 2014-03-14 NOTE — H&P (Addendum)
[facility] History and Physical  Julie Rich GNF:[id number] DOB: 05/20/1922 DOA: 03/13/2014  Referring physician: ER physician. PCP: Rudi Heap, MD  Chief Complaint: Chest pain.  HPI: Julie Rich is a 80 y.o. female with history of atrial fibrillation, hypertension, COPD, hyperlipidemia, CHF started experiencing chest pain while she was having supper. Patient chest pain was persistent retrosternal nonradiating with no associated diaphoresis or shortness of breath. When patient checked her blood pressure it was more than 200 systolic. Since the pain was persistent patient came to the ER. Patient was given nitroglycerin with minimal relief. EKG showed atrial fibrillation and cardiac markers were negative. Patient has been admitted for further management of her chest pain. Patient has been recently being worked up for chronic abdominal pain by patient's gastroenterologist and had a recent MRI T-spine. On my examination chest pain is largely improved.   Review of Systems: As presented in the history of presenting illness, rest negative.  Past Medical History  Diagnosis Date  . HTN (hypertension)   . A-fib   . Dyslipidemia   . Hypothyroidism   . Stroke   . Asthma   . Shortness of breath   . CHF (congestive heart failure)   . GERD (gastroesophageal reflux disease)   . Anxiety   . COPD (chronic obstructive pulmonary disease)   . Dysrhythmia   . Arthritis    Past Surgical History  Procedure Laterality Date  . Total abdominal hysterectomy    . Breast surgery      45-50 YEARS AGO LUMP REMOVED  . Appendectomy    . Cardioversion  06/26/2011    Procedure: CARDIOVERSION;  Surgeon: Marca Ancona, MD;  Location: [facility] OR;  Service: Cardiovascular;  Laterality: N/A;  . Tee without cardioversion N/A 07/24/2012    Procedure: TRANSESOPHAGEAL ECHOCARDIOGRAM (TEE);  Surgeon: Pricilla Riffle, MD;  Location: [facility] ENDOSCOPY;  Service: Cardiovascular;  Laterality: N/A;  . Cardioversion N/A 07/24/2012     Procedure: CARDIOVERSION;  Surgeon: Pricilla Riffle, MD;  Location: [facility] ENDOSCOPY;  Service: Cardiovascular;  Laterality: N/A;  . Cardioversion N/A 09/14/2012    Procedure: CARDIOVERSION;  Surgeon: Pricilla Riffle, MD;  Location: [facility] ENDOSCOPY;  Service: Cardiovascular;  Laterality: N/A;  . Tonsillectomy    . Eye surgery      removed cataract in both eyes 5 years ago  . Tubal ligation     Social History:  reports that she has never smoked. She has never used smokeless tobacco. She reports that she does not drink alcohol or use illicit drugs. Where does patient live home. Can patient participate in ADLs? Yes.  Allergies  Allergen Reactions  . Ciprofloxacin Other (See Comments)    unknown  . Quinolones Other (See Comments)    unknown  . Sulfonamide Derivatives Other (See Comments)    unknown  . Zolpidem Tartrate Other (See Comments)    Hallucinations    Family History:  Family History  Problem Relation Age of Onset  . Coronary artery disease Neg Hx   . Stroke Brother   . Breast cancer Mother       Prior to Admission medications   Medication Sig Start Date End Date Taking? Authorizing Provider  acetaminophen (TYLENOL) 325 MG tablet Take 2 tablets (650 mg total) by mouth every 6 (six) hours as needed for mild pain (or Fever >/= 101). 07/08/13  Yes Christiane Ha, MD  albuterol (PROAIR HFA) 108 (90 BASE) MCG/ACT inhaler Inhale 2 puffs into the lungs every 6 (six) hours as  needed for wheezing or shortness of breath.   Yes Historical Provider, MD  cholecalciferol (VITAMIN D) 1000 UNITS tablet Take 1,000 Units by mouth daily.   Yes Historical Provider, MD  diazepam (VALIUM) 5 MG tablet Take 2.5-5 mg by mouth every 6 (six) hours as needed for anxiety or muscle spasms.   Yes Historical Provider, MD  digoxin 62.5 MCG TABS Take 0.0625 mg by mouth daily. 07/08/13  Yes Christiane Haorinna L Sullivan, MD  diltiazem (CARDIZEM CD) 360 MG 24 hr capsule Take 1 capsule (360 mg total) by mouth daily. 07/08/13  Yes  Christiane Haorinna L Sullivan, MD  Fluticasone-Salmeterol (ADVAIR DISKUS) 250-50 MCG/DOSE AEPB Inhale 2 puffs into the lungs 2 (two) times daily.   Yes Historical Provider, MD  furosemide (LASIX) 20 MG tablet Take 20 mg by mouth daily.   Yes Historical Provider, MD  guaiFENesin (MUCINEX) 600 MG 12 hr tablet Take 2 tablets (1,200 mg total) by mouth 2 (two) times daily as needed for cough. 07/08/13  Yes Christiane Haorinna L Sullivan, MD  levothyroxine (SYNTHROID, LEVOTHROID) 50 MCG tablet Take 50-75 mcg by mouth daily before breakfast. Take one tablet (50mcg) by mouth every day except Sunday and Wednesday, take 1 1/2 tablets (75mcg)   Yes Historical Provider, MD  loratadine (CLARITIN) 10 MG tablet Take 10 mg by mouth at bedtime.   Yes Historical Provider, MD  OXYGEN-HELIUM IN Inhale into the lungs daily. 2 Liters   Yes Historical Provider, MD  potassium chloride (KLOR-CON) 8 MEQ tablet Take 16 mEq by mouth daily.   Yes Historical Provider, MD  Rivaroxaban (XARELTO) 15 MG TABS tablet Take 15 mg by mouth daily with supper.  08/24/12  Yes Rollene RotundaJames Hochrein, MD  senna-docusate (SENOKOT-S) 8.6-50 MG per tablet Take 2 tablets by mouth at bedtime. 07/03/12  Yes Kathlen ModyVijaya Akula, MD  temazepam (RESTORIL) 15 MG capsule Take 15 mg by mouth at bedtime. 08/03/12  Yes Rhonda G Barrett, PA-C  tiotropium (SPIRIVA) 18 MCG inhalation capsule Place 18 mcg into inhaler and inhale daily. Inhale 2 puffs from 1 capsule   Yes Historical Provider, MD  traMADol (ULTRAM) 50 MG tablet Take 25-50 mg by mouth every 6 (six) hours as needed for moderate pain.   Yes Historical Provider, MD    Physical Exam: Filed Vitals:   03/13/14 2345 03/13/14 2349 03/14/14 0015 03/14/14 0057  BP: 164/77 164/77 149/72 174/70  Pulse: 99 89 81 78  Temp:    98 F (36.7 C)  Resp: 22 25 22 22   Height:    5\' 1"  (1.549 m)  Weight:    52.889 kg (116 lb 9.6 oz)  SpO2: 98%  99% 100%     General:  Well-developed and nourished.  Eyes: Anicteric no pallor.  ENT: No discharge  from ears eyes nose mouth.  Neck: No mass felt.  Cardiovascular: S1-S2 heard.  Respiratory: No rhonchi or crepitations.  Abdomen: Soft nontender bowel sounds present. No guarding rigidity.  Skin: No rash.  Musculoskeletal: No edema.  Psychiatric: Appears normal.  Neurologic: Alert awake oriented to time place and person. Moves all extremities.  Labs on Admission:  Basic Metabolic Panel:  Recent Labs Lab 03/13/14 2211  NA 141  K 4.8  CL 104  CO2 30  GLUCOSE 110*  BUN 33*  CREATININE 1.42*  CALCIUM 8.7   Liver Function Tests:  Recent Labs Lab 03/13/14 2211  AST 11  ALT 11  ALKPHOS 93  BILITOT <0.2*  PROT 6.3  ALBUMIN 3.2*   No results found for  this basename: LIPASE, AMYLASE,  in the last 168 hours No results found for this basename: AMMONIA,  in the last 168 hours CBC:  Recent Labs Lab 03/13/14 2211  WBC 8.4  NEUTROABS 6.6  HGB 12.0  HCT 38.9  MCV 91.5  PLT 209   Cardiac Enzymes:  Recent Labs Lab 03/13/14 2211  TROPONINI <0.30    BNP (last 3 results)  Recent Labs  05/12/13 1932 07/03/13 2200  PROBNP 949.0* 1877.0*   CBG: No results found for this basename: GLUCAP,  in the last 168 hours  Radiological Exams on Admission: Dg Chest Port 1 View  03/13/2014   CLINICAL DATA:  Sudden onset chest pain. History of atrial fibrillation and COPD.  EXAM: PORTABLE CHEST - 1 VIEW  COMPARISON:  07/03/2013  FINDINGS: Borderline heart size with normal pulmonary vascularity. Emphysematous changes in the lungs. No focal airspace disease. Vague nodular opacity over the left mid lung measures 7 mm diameter. Followup elective CT is suggested for further evaluation. Calcified and tortuous aorta.  IMPRESSION: Emphysematous changes in the lungs. No evidence of active pulmonary disease. Vague 7 mm nodule projected over the left mid lung.   Electronically Signed   By: Burman NievesWilliam  Stevens M.D.   On: 03/13/2014 22:47    EKG: Independently reviewed. Atrial  fibrillation rate controlled.  Assessment/Plan Principal Problem:   Chest pain Active Problems:   Abdominal pain   Chronic atrial fibrillation   History of COPD   1. Chest pain - presently chest pain-free. Cycle cardiac markers. Check 2-D echo. Patient does not want to be on aspirin. Continue with xarelto. When necessary nitroglycerin and fentanyl. 2. Uncontrolled hypertension - patient blood pressure has improved to 170 systolic at this time. Closely follow blood pressure trends. 3. COPD and home oxygen - presently not wheezing. 4. History of CHF last EF measured in December 2014 was 60%. On Lasix. 5. Lung nodule - will need further followup as outpatient. 6. Renal failure probably chronic stage II creatinine almost at baseline when compared to recent - closely follow metabolic panel. 7. Chronic atrial fibrillation - rate controlled. Continue rate controlling medications and patient is on xarelto. 8. Hypothyroidism - continue Synthroid.    Code Status: Full code.  Family Communication: None.  Disposition Plan: Admit for observation.    Bassy Fetterly N. [facility] Pager [phone].  If 7PM-7AM, please contact night-coverage [email or web address] Password [email or web address] 03/14/2014, 1:46 AM

## 2014-03-14 NOTE — Progress Notes (Signed)
Followup note:  Patient admitted earlier this morning and then see Dr. in followup. Complaining of chest pain which has since resolved. See my cardiology recommended noninvasive testing. Echocardiogram done which was unrevealing. Enzymes x3 negative.  Initially plan sent patient home, but nursing noted that patient whenever she eats starts getting tachycardia and they're concerned about son aspiration. Speech therapy consulted but has not yet seen patient. In addition, patient is having moderate to severe abdominal pain which her PCP was concerned about spinal issues and a thoracic MRI was done which was unrevealing. Reportedly, this should be a lumbar MRI so we'll try to order this before patient is discharged. Likely plan for discharge in 10/20.

## 2014-03-14 NOTE — Discharge Summary (Signed)
Discharge Summary  Julie Rich RUE:[id number] DOB: [birth year]  PCP: Ezequiel Kayser, MD\  Admit date: 03/13/2014 Discharge date: 03/14/2014  Time spent: 25 minutes  Recommendations for Outpatient Follow-up:  1. Patient will followup with her PCP Dr.Perini 2. New medications: Imdur 30 mg by mouth daily 3. New medications: Prilosec 20 mg by mouth daily  Discharge Diagnoses:  Active Hospital Problems   Diagnosis Date Noted  . Chest pain 07/05/2011  . Chronic atrial fibrillation 03/14/2014  . History of COPD 03/14/2014  . Hypothyroidism 03/14/2014  . Dysphagia 03/14/2014  . Abdominal pain 08/31/2013  . Chronic anticoagulation 08/03/2012  . Diastolic CHF, chronic 06/28/2012  . Essential hypertension 04/14/2007  . Hyperlipidemia 04/14/2007    Resolved Hospital Problems   Diagnosis Date Noted Date Resolved  No resolved problems to display.    Discharge Condition: Improved, being discharged home  Diet recommendation: Heart healthy  Filed Weights   03/14/14 0057 03/14/14 0519  Weight: 52.889 kg (116 lb 9.6 oz) 52.8 kg (116 lb 6.5 oz)    History of present illness:  Patient is a 80 year old female past medical history chronic diastolic CHF, hypertension and atrial fibrillation on chronic anticoagulation who presents to the emergency room on the evening of 10/18 complaining of chest pain. Initial enzymes and EKG were unrevealing. Patient was brought in to the hospitalist service for further evaluation.  Hospital Course:  Principal Problem:   Chest pain: Pain resolved soon after admission. Enzymes x3 negative. Felt to be noncardiac. Pain atypical but given advanced age, the best for now not invasive approach, as confirmed with daughter. Echocardiogram done to reevaluate ejection fraction. Imdur and PPI added. Active Problems:   Hyperlipidemia: On statin    Essential hypertension: Blood pressures elevated. See my cardiology. Recommending adding Imdur to her regimen.   Diastolic CHF, chronic: Stable. Echocardiogram done 10/19 noting preserved ejection fraction. No signs of acute congestive heart failure.    Chronic anticoagulation: Stable, continue on Xarelto    Chronic atrial fibrillation: Stable, rate controlled, on anticoagulation.    History of COPD: Stable. No evidence of acute exacerbation.    Hypothyroidism: Stable continue on Synthroid.    Dysphagia: Nursing noted patient having coughing and elevated heart rate when eating. Speech therapy saw patient and patient passed a modified barium swallow without any difficulty.   Procedures: Echocardiogram done 10/19: Preserved ejection fraction 55-60%.  Consultations:  Cardiology  Discharge Exam: BP 135/70  Pulse 105  Temp(Src) 97.7 F (36.5 C) (Oral)  Resp 20  Ht 5\' 1"  (1.549 m)  Wt 52.8 kg (116 lb 6.5 oz)  BMI 22.01 kg/m2  SpO2 100%  General: Alert and oriented x3, no acute distress Cardiovascular: Irregular rhythm, rate controlled, 2/6 systolic ejection murmur Respiratory: Clear to auscultation bilaterally  Discharge Instructions You were cared for by a hospitalist during your hospital stay. If you have any questions about your discharge medications or the care you received while you were in the hospital after you are discharged, you can call the unit and asked to speak with the hospitalist on call if the hospitalist that took care of you is not available. Once you are discharged, your primary care physician will handle any further medical issues. Please note that NO REFILLS for any discharge medications will be authorized once you are discharged, as it is imperative that you return to your primary care physician (or establish a relationship with a primary care physician if you do not have one) for your aftercare needs so that they can  reassess your need for medications and monitor your lab values.     Medication List         acetaminophen 325 MG tablet  Commonly known as:  TYLENOL    Take 2 tablets (650 mg total) by mouth every 6 (six) hours as needed for mild pain (or Fever >/= 101).     ADVAIR DISKUS 250-50 MCG/DOSE Aepb  Generic drug:  Fluticasone-Salmeterol  Inhale 2 puffs into the lungs 2 (two) times daily.     cholecalciferol 1000 UNITS tablet  Commonly known as:  VITAMIN D  Take 1,000 Units by mouth daily.     diazepam 5 MG tablet  Commonly known as:  VALIUM  Take 2.5-5 mg by mouth every 6 (six) hours as needed for anxiety or muscle spasms.     Digoxin 62.5 MCG Tabs  Take 0.0625 mg by mouth daily.     diltiazem 360 MG 24 hr capsule  Commonly known as:  CARDIZEM CD  Take 1 capsule (360 mg total) by mouth daily.     furosemide 20 MG tablet  Commonly known as:  LASIX  Take 20 mg by mouth daily.     guaiFENesin 600 MG 12 hr tablet  Commonly known as:  MUCINEX  Take 2 tablets (1,200 mg total) by mouth 2 (two) times daily as needed for cough.     isosorbide mononitrate 30 MG 24 hr tablet  Commonly known as:  IMDUR  Take 1 tablet (30 mg total) by mouth daily.     levothyroxine 50 MCG tablet  Commonly known as:  SYNTHROID, LEVOTHROID  Take 50-75 mcg by mouth daily before breakfast. Take one tablet (50mcg) by mouth every day except Sunday and Wednesday, take 1 1/2 tablets (75mcg)     loratadine 10 MG tablet  Commonly known as:  CLARITIN  Take 10 mg by mouth at bedtime.     omeprazole 20 MG capsule  Commonly known as:  PRILOSEC  Take 1 capsule (20 mg total) by mouth daily.     OXYGEN  Inhale into the lungs daily. 2 Liters     potassium chloride 8 MEQ tablet  Commonly known as:  KLOR-CON  Take 16 mEq by mouth daily.     PROAIR HFA 108 (90 BASE) MCG/ACT inhaler  Generic drug:  albuterol  Inhale 2 puffs into the lungs every 6 (six) hours as needed for wheezing or shortness of breath.     Rivaroxaban 15 MG Tabs tablet  Commonly known as:  XARELTO  Take 15 mg by mouth daily with supper.     senna-docusate 8.6-50 MG per tablet  Commonly known  as:  Senokot-S  Take 2 tablets by mouth at bedtime.     temazepam 15 MG capsule  Commonly known as:  RESTORIL  Take 15 mg by mouth at bedtime.     tiotropium 18 MCG inhalation capsule  Commonly known as:  SPIRIVA  Place 18 mcg into inhaler and inhale daily. Inhale 2 puffs from 1 capsule     traMADol 50 MG tablet  Commonly known as:  ULTRAM  Take 25-50 mg by mouth every 6 (six) hours as needed for moderate pain.        Allergies  Allergen Reactions  . Ciprofloxacin Other (See Comments)    unknown  . Quinolones Other (See Comments)    unknown  . Sulfonamide Derivatives Other (See Comments)    unknown  . Zolpidem Tartrate Other (See Comments)    Hallucinations  The results of significant diagnostics from this hospitalization (including imaging, microbiology, ancillary and laboratory) are listed below for reference.    Significant Diagnostic Studies: Mr Thoracic Spine Wo Contrast  02/24/2014   CLINICAL DATA:  Mid back pain, chronic for 1 year, getting worse  EXAM: MRI THORACIC SPINE WITHOUT CONTRAST  TECHNIQUE: Multiplanar, multisequence MR imaging of the thoracic spine was performed. No intravenous contrast was administered.  COMPARISON:  None.  FINDINGS: The vertebral body heights are maintained. The alignment is anatomic. There is no acute fracture or static listhesis. There is mild degenerative disc disease with disc height loss of the mid thoracic spine. The thoracic spinal cord is normal in size and signal. The marrow signal is normal.  There is no foraminal or central canal stenosis. There is a mild right paracentral disc bulge at T3-4. There is a mild right paracentral disc bulge at T7-8 and T8-9. There is a small shallow central disc protrusion at T9-10. There is a shallow central disc extrusion at L1-2 with superior migration of disc material along the left paracentral aspect of the L1 vertebral body.  There is degenerative disc disease at C3-4, C4-5, C5-6 and C6-7.   IMPRESSION: 1. No acute injury of the thoracic spine. 2. Mild thoracic spine spondylosis as described above.   Electronically Signed   By: Elige KoHetal  Patel   On: 02/24/2014 16:09   Dg Chest Port 1 View  03/13/2014   CLINICAL DATA:  Sudden onset chest pain. History of atrial fibrillation and COPD.  EXAM: PORTABLE CHEST - 1 VIEW  COMPARISON:  07/03/2013  FINDINGS: Borderline heart size with normal pulmonary vascularity. Emphysematous changes in the lungs. No focal airspace disease. Vague nodular opacity over the left mid lung measures 7 mm diameter. Followup elective CT is suggested for further evaluation. Calcified and tortuous aorta.  IMPRESSION: Emphysematous changes in the lungs. No evidence of active pulmonary disease. Vague 7 mm nodule projected over the left mid lung.   Electronically Signed   By: Burman NievesWilliam  Stevens M.D.   On: 03/13/2014 22:47    Microbiology: No results found for this or any previous visit (from the past 240 hour(s)).   Labs: Basic Metabolic Panel:  Recent Labs Lab 03/13/14 2211 03/14/14 0750  NA 141 143  K 4.8 4.7  CL 104 106  CO2 30 29  GLUCOSE 110* 89  BUN 33* 25*  CREATININE 1.42* 0.91  CALCIUM 8.7 8.5   Liver Function Tests:  Recent Labs Lab 03/13/14 2211 03/14/14 0750  AST 11 11  ALT 11 10  ALKPHOS 93 88  BILITOT <0.2* 0.3  PROT 6.3 5.7*  ALBUMIN 3.2* 2.9*    Recent Labs Lab 03/14/14 0244  LIPASE 56   No results found for this basename: AMMONIA,  in the last 168 hours CBC:  Recent Labs Lab 03/13/14 2211 03/14/14 0750  WBC 8.4 7.0  NEUTROABS 6.6 5.1  HGB 12.0 12.0  HCT 38.9 39.3  MCV 91.5 93.8  PLT 209 188   Cardiac Enzymes:  Recent Labs Lab 03/13/14 2211 03/14/14 0244 03/14/14 0750  TROPONINI <0.30 <0.30 <0.30      Signed:  Virginia RochesterKRISHNAN,Legaci Tarman K  [facility] 03/14/2014, 12:27 PM

## 2014-03-14 NOTE — Progress Notes (Signed)
UR completed 

## 2014-03-14 NOTE — ED Notes (Signed)
Transporting patient to new room assignment. 

## 2014-03-14 NOTE — Consult Note (Signed)
Primary Physician:  Perini Primary Cardiologist:  Duane Boston   HPI: Patient is a 80 yo who we are asked to see re CP Patient has history of afib, HTN , COPD, HL and diastolic CHF Yesterday AM felt OK During dinner eating and then felt like BP was rising  Went to lay down  Someone took BP and it was high   NO SOB  Felt heavy in chest   Last night developed CP (substernal, no radiation)  No SOB  BP at time greater than 200  Came to ER.  NTG with minimal relief   Now  Still having fullness No palpitations.   At home taking meds, not missing.          Past Medical History  Diagnosis Date  . HTN (hypertension)   . A-fib   . Dyslipidemia   . Hypothyroidism   . Stroke   . Asthma   . Shortness of breath   . CHF (congestive heart failure)   . GERD (gastroesophageal reflux disease)   . Anxiety   . COPD (chronic obstructive pulmonary disease)   . Dysrhythmia   . Arthritis     Medications Prior to Admission  Medication Sig Dispense Refill  . acetaminophen (TYLENOL) 325 MG tablet Take 2 tablets (650 mg total) by mouth every 6 (six) hours as needed for mild pain (or Fever >/= 101).      Marland Kitchen albuterol (PROAIR HFA) 108 (90 BASE) MCG/ACT inhaler Inhale 2 puffs into the lungs every 6 (six) hours as needed for wheezing or shortness of breath.      . cholecalciferol (VITAMIN D) 1000 UNITS tablet Take 1,000 Units by mouth daily.      . diazepam (VALIUM) 5 MG tablet Take 2.5-5 mg by mouth every 6 (six) hours as needed for anxiety or muscle spasms.      . digoxin 62.5 MCG TABS Take 0.0625 mg by mouth daily.  30 tablet  0  . diltiazem (CARDIZEM CD) 360 MG 24 hr capsule Take 1 capsule (360 mg total) by mouth daily.  30 capsule  0  . Fluticasone-Salmeterol (ADVAIR DISKUS) 250-50 MCG/DOSE AEPB Inhale 2 puffs into the lungs 2 (two) times daily.      . furosemide (LASIX) 20 MG tablet Take 20 mg by mouth daily.      Marland Kitchen guaiFENesin (MUCINEX) 600 MG 12 hr tablet Take 2 tablets (1,200 mg  total) by mouth 2 (two) times daily as needed for cough.      . levothyroxine (SYNTHROID, LEVOTHROID) 50 MCG tablet Take 50-75 mcg by mouth daily before breakfast. Take one tablet ( ) by mouth every day except Sunday and Wednesday, take 1 1/2 tablets ( )      . loratadine (CLARITIN) 10 MG tablet Take 10 mg by mouth at bedtime.      Docia Barrier IN Inhale into the lungs daily. 2 Liters      . potassium chloride (KLOR-CON) 8 MEQ tablet Take 16 mEq by mouth daily.      . Rivaroxaban (XARELTO) 15 MG TABS tablet Take 15 mg by mouth daily with supper.       . senna-docusate (SENOKOT-S) 8.6-50 MG per tablet Take 2 tablets by mouth at bedtime.      . temazepam (RESTORIL) 15 MG capsule Take 15 mg by mouth at bedtime.      Marland Kitchen tiotropium (SPIRIVA) 18 MCG inhalation capsule Place 18 mcg into inhaler and inhale daily. Inhale 2 puffs from 1 capsule      .  traMADol (ULTRAM) 50 MG tablet Take 25-50 mg by mouth every 6 (six) hours as needed for moderate pain.         Marland Kitchen. antiseptic oral rinse  7 mL Mouth Rinse BID  . cholecalciferol  1,000 Units Oral Daily  . digoxin  0.0625 mg Oral Daily  . diltiazem  360 mg Oral Daily  . furosemide  20 mg Oral Daily  . levothyroxine  50 mcg Oral Once per day on Mon Tue Thu Fri Sat   And  . [START ON 03/16/2014] levothyroxine  75 mcg Oral Once per day on Sun Wed  . loratadine  10 mg Oral QHS  . mometasone-formoterol  2 puff Inhalation BID  . Rivaroxaban  15 mg Oral Q supper  . senna-docusate  2 tablet Oral QHS  . sodium chloride  3 mL Intravenous Q12H  . sodium chloride  3 mL Intravenous Q12H  . temazepam  15 mg Oral QHS  . tiotropium  18 mcg Inhalation Daily    Infusions:    Allergies  Allergen Reactions  . Ciprofloxacin Other (See Comments)    unknown  . Quinolones Other (See Comments)    unknown  . Sulfonamide Derivatives Other (See Comments)    unknown  . Zolpidem Tartrate Other (See Comments)    Hallucinations    History   Social History    . Marital Status: Divorced    Spouse Name: N/A    Number of Children: N/A  . Years of Education: N/A   Occupational History  . retired    Social History Main Topics  . Smoking status: Never Smoker   . Smokeless tobacco: Never Used     Comment: does not smoke   . Alcohol Use: No  . Drug Use: No  . Sexual Activity: No   Other Topics Concern  . Not on file   Social History Narrative   Lives alone, has a daughter fairly close by, does not drink a lot of caffeine.     Family History  Problem Relation Age of Onset  . Coronary artery disease Neg Hx   . Stroke Brother   . Breast cancer Mother     REVIEW OF SYSTEMS:  All systems reviewed  Negative to the above problem except as noted above.    PHYSICAL EXAM: Filed Vitals:   03/14/14 0519  BP: 149/70  Pulse: 58  Temp: 97.7 F (36.5 C)  Resp: 20     Intake/Output Summary (Last 24 hours) at 03/14/14 0756 Last data filed at 03/14/14 0600  Gross per 24 hour  Intake      0 ml  Output      0 ml  Net      0 ml    General:  Well appearing. No respiratory difficulty HEENT: normal Neck: supple. no JVD. Carotids 2+ bilat; no bruits. No lymphadenopathy or thryomegaly appreciated. Cor: PMI nondisplaced. Irregular rate & rhythm. No rubs, gallops or murmurs. Lungs: clear Abdomen: soft, nontender, nondistended. No hepatosplenomegaly. No bruits or masses. Good bowel sounds. Extremities: no cyanosis, clubbing, rash, edema Neuro: alert & oriented x 3, cranial nerves grossly intact. moves all 4 extremities w/o difficulty. Affect pleasant.  ECG:  Afib  86 bppm    Results for orders placed during the hospital encounter of 03/13/14 (from the past 24 hour(s))  CBC WITH DIFFERENTIAL     Status: Abnormal   Collection Time    03/13/14 10:11 PM      Result Value Ref Range  WBC 8.4  4.0 - 10.5 K/uL   RBC 4.25  3.87 - 5.11 MIL/uL   Hemoglobin 12.0  12.0 - 15.0 g/dL   HCT 65.738.9  84.636.0 - 96.246.0 %   MCV 91.5  78.0 - 100.0 fL   MCH 28.2   26.0 - 34.0 pg   MCHC 30.8  30.0 - 36.0 g/dL   RDW 95.216.1 (*) 84.111.5 - 32.415.5 %   Platelets 209  150 - 400 K/uL   Neutrophils Relative % 78 (*) 43 - 77 %   Neutro Abs 6.6  1.7 - 7.7 K/uL   Lymphocytes Relative 10 (*) 12 - 46 %   Lymphs Abs 0.8  0.7 - 4.0 K/uL   Monocytes Relative 10  3 - 12 %   Monocytes Absolute 0.8  0.1 - 1.0 K/uL   Eosinophils Relative 2  0 - 5 %   Eosinophils Absolute 0.2  0.0 - 0.7 K/uL   Basophils Relative 0  0 - 1 %   Basophils Absolute 0.0  0.0 - 0.1 K/uL  COMPREHENSIVE METABOLIC PANEL     Status: Abnormal   Collection Time    03/13/14 10:11 PM      Result Value Ref Range   Sodium 141  137 - 147 mEq/L   Potassium 4.8  3.7 - 5.3 mEq/L   Chloride 104  96 - 112 mEq/L   CO2 30  19 - 32 mEq/L   Glucose, Bld 110 (*) 70 - 99 mg/dL   BUN 33 (*) 6 - 23 mg/dL   Creatinine, Ser 4.011.42 (*) 0.50 - 1.10 mg/dL   Calcium 8.7  8.4 - 02.710.5 mg/dL   Total Protein 6.3  6.0 - 8.3 g/dL   Albumin 3.2 (*) 3.5 - 5.2 g/dL   AST 11  0 - 37 U/L   ALT 11  0 - 35 U/L   Alkaline Phosphatase 93  39 - 117 U/L   Total Bilirubin <0.2 (*) 0.3 - 1.2 mg/dL   GFR calc non Af Amer 31 (*) >90 mL/min   GFR calc Af Amer 36 (*) >90 mL/min   Anion gap 7  5 - 15  TROPONIN I     Status: None   Collection Time    03/13/14 10:11 PM      Result Value Ref Range   Troponin I <0.30  <0.30 ng/mL  PROTIME-INR     Status: Abnormal   Collection Time    03/13/14 10:11 PM      Result Value Ref Range   Prothrombin Time 16.6 (*) 11.6 - 15.2 seconds   INR 1.33  0.00 - 1.49  TROPONIN I     Status: None   Collection Time    03/14/14  2:44 AM      Result Value Ref Range   Troponin I <0.30  <0.30 ng/mL  LIPASE, BLOOD     Status: None   Collection Time    03/14/14  2:44 AM      Result Value Ref Range   Lipase 56  11 - 59 U/L  DIGOXIN LEVEL     Status: Abnormal   Collection Time    03/14/14  2:44 AM      Result Value Ref Range   Digoxin Level 0.7 (*) 0.8 - 2.0 ng/mL   Dg Chest Port 1 View  03/13/2014    CLINICAL DATA:  Sudden onset chest pain. History of atrial fibrillation and COPD.  EXAM: PORTABLE CHEST - 1 VIEW  COMPARISON:  07/03/2013  FINDINGS: Borderline heart size with normal pulmonary vascularity. Emphysematous changes in the lungs. No focal airspace disease. Vague nodular opacity over the left mid lung measures 7 mm diameter. Followup elective CT is suggested for further evaluation. Calcified and tortuous aorta.  IMPRESSION: Emphysematous changes in the lungs. No evidence of active pulmonary disease. Vague 7 mm nodule projected over the left mid lung.   Electronically Signed   By: Burman Nieves M.D.   On: 03/13/2014 22:47     ASSESSMENT: Patient is a 80 yo who presents for evaluation of CP Pain is somewhat atypical  But patient is 80 yo   Exam without CHF  BP is better now that in hospital   EKG with atrial fibrillation.  Rates have been controlled Has r/o for MI  Reviewed with daughter.  For now does not want invasive approach   Will get echo to reevaluate LVEF  Add Imdur to regimen Add PPI to regimen  2.  Atrial fib  Continue rate control and anticoagulation  3.  HTN  Follow in hospital.

## 2014-03-15 ENCOUNTER — Observation Stay (HOSPITAL_COMMUNITY): Payer: Medicare Other

## 2014-03-15 DIAGNOSIS — I10 Essential (primary) hypertension: Secondary | ICD-10-CM

## 2014-03-15 DIAGNOSIS — R0789 Other chest pain: Secondary | ICD-10-CM | POA: Diagnosis not present

## 2014-03-15 DIAGNOSIS — I5032 Chronic diastolic (congestive) heart failure: Secondary | ICD-10-CM

## 2014-03-15 NOTE — Procedures (Signed)
Objective Swallowing Evaluation: Modified Barium Swallowing Study  Patient Details  Name: Julie Rich MRN: [id number] Date of Birth: 03-04-1922  Today's Date: 03/15/2014 Time: 1330-1430 SLP Time Calculation (min): 60 min  Past Medical History:  Past Medical History  Diagnosis Date  . HTN (hypertension)   . A-fib   . Dyslipidemia   . Hypothyroidism   . Stroke   . Asthma   . Shortness of breath   . CHF (congestive heart failure)   . GERD (gastroesophageal reflux disease)   . Anxiety   . COPD (chronic obstructive pulmonary disease)   . Dysrhythmia   . Arthritis    Past Surgical History:  Past Surgical History  Procedure Laterality Date  . Total abdominal hysterectomy    . Breast surgery      45-50 YEARS AGO LUMP REMOVED  . Appendectomy    . Cardioversion  06/26/2011    Procedure: CARDIOVERSION;  Surgeon: Marca Anconaalton McLean, MD;  [facility];  Service: Cardiovascular;  Laterality: N/A;  . Tee without cardioversion N/A 07/24/2012    Procedure: TRANSESOPHAGEAL ECHOCARDIOGRAM (TEE);  Surgeon: Pricilla RifflePaula V Ross, MD;  [facility];  Service: Cardiovascular;  Laterality: N/A;  . Cardioversion N/A 07/24/2012    Procedure: CARDIOVERSION;  Surgeon: Pricilla RifflePaula V Ross, MD;  [facility];  Service: Cardiovascular;  Laterality: N/A;  . Cardioversion N/A 09/14/2012    Procedure: CARDIOVERSION;  Surgeon: Pricilla RifflePaula V Ross, MD;  [facility];  Service: Cardiovascular;  Laterality: N/A;  . Tonsillectomy    . Eye surgery      removed cataract in both eyes 5 years ago  . Tubal ligation     HPI:  Julie Rich is a 80 y.o. female with history of atrial fibrillation, hypertension, COPD, hyperlipidemia, CHF started experiencing chest pain while she was having supper.  CP resolved; tachy when eating so swallow eval ordered.  Evaluated by SLP in Feb of this year with normal findings but recs for strategies during COPD exac. events.  Recent 10/18 Chest CT Emphysematous changes in the  lungs. No evidence of active pulmonary dz.   Pt's family reports frequent cough with PO intake last two weeks; MBS recommended.     Assessment / Plan / Recommendation Clinical Impression  Dysphagia Diagnosis: Mild pharyngeal phase dysphagia  Clinical impression: Pt presents with a mild pharyngeal dysphagia c/b trace, silent aspiration of thin liquids.  There is base-of-tongue weakness and decreased laryngeal elevation, leading to vallecular and pyriform residue.  When hypopharynx retained solids, aspiration of subsequent liquids was more likely.  A head turn to the right helped prevent aspiration.    Discussed results and recs with pt and daughter, Junious DresserConnie. We discussed potential for aspiration PNA in the context of future acute medical events.  For now, with precautions in place, pt should remain on a regular diet with thin liquids.  Pt/dtr in agreement - no f/u recommended.         Treatment Recommendation   (pt for D/C this afternoon) - Demonstrated to pt Masako maneuver for base of tongue strengthening.  Recommmend 5x a session; 3 sessions/day.     Diet Recommendation Regular;Thin liquid   Liquid Administration via: Cup;Straw Medication Administration: Whole meds with puree Supervision: Patient able to self feed Compensations: Follow solids with liquid (one dry swallow after each bite to clear residue) Postural Changes and/or Swallow Maneuvers: Head turn right during swallow    Other  Recommendations Recommended Consults: MBS Oral Care Recommendations: Oral care BID  Follow Up Recommendations  None        General HPI: Julie Rich is a 80 y.o. female with history of atrial fibrillation, hypertension, COPD, hyperlipidemia, CHF started experiencing chest pain while she was having supper.  CP resolved; tachy when eating so swallow eval ordered.  Evaluated by SLP in Feb of this year with normal findings but recs for strategies during COPD exac. events.  Recent 10/18 Chest CT  Emphysematous changes in the lungs. No evidence of active pulmonary dz.   Type of Study: Modified Barium Swallowing Study Reason for Referral: Objectively evaluate swallowing function Previous Swallow Assessment: Feb 2015 Diet Prior to this Study: Regular;Thin liquids Temperature Spikes Noted: No Respiratory Status: Nasal cannula History of Recent Intubation: No Behavior/Cognition: Alert;Cooperative Oral Cavity - Dentition: Adequate natural dentition Oral Motor / Sensory Function: Within functional limits Self-Feeding Abilities: Able to feed self Patient Positioning: Upright in chair Baseline Vocal Quality: Clear Volitional Cough: Strong Volitional Swallow: Able to elicit Anatomy: Within functional limits    Reason for Referral Objectively evaluate swallowing function   Oral Phase Oral Preparation/Oral Phase Oral Phase: WFL   Pharyngeal Phase Pharyngeal Phase Pharyngeal Phase: Impaired Pharyngeal - Thin Pharyngeal - Thin Cup: Delayed swallow initiation;Reduced tongue base retraction;Reduced laryngeal elevation;Penetration/Aspiration during swallow;Trace aspiration;Compensatory strategies attempted (Comment) (head turn to right reduced aspiration) Penetration/Aspiration details (thin cup): Material enters airway, passes BELOW cords without attempt by patient to eject out (silent aspiration) Pharyngeal - Solids Pharyngeal - Puree: Reduced laryngeal elevation;Reduced tongue base retraction;Pharyngeal residue - valleculae;Pharyngeal residue - pyriform sinuses Pharyngeal - Mechanical Soft: Reduced laryngeal elevation;Reduced tongue base retraction;Pharyngeal residue - valleculae;Pharyngeal residue - pyriform sinuses  Cervical Esophageal Phase    GO         Functional Assessment Tool Used: clinical judgement Functional Limitations: Swallowing Swallow Current Status (Z6109(G8996): At least 1 percent but less than 20 percent impaired, limited or restricted Swallow Goal Status [phone](G8997):  At least 1 percent but less than 20 percent impaired, limited or restricted Swallow Discharge Status [phone](G8998): At least 1 percent but less than 20 percent impaired, limited or restricted   Immanuel Fedak L. Samson Fredericouture, [place] CCC/SLP Pager [phone]  Blenda MountsCouture, Keisi Eckford Laurice 03/15/2014, 2:38 PM

## 2014-03-15 NOTE — Progress Notes (Signed)
UR completed 

## 2014-03-15 NOTE — Progress Notes (Signed)
Pt's daughter, Junious DresserConnie, called this evening asking if patient received cardizem.  I let daughter know that she has received her daily dose this morning.  Daughter had concerns that the patient takes this medication at night and what was she supposed to do now.  I informed her that patient has already received her daily dose, and if she has other concerns she should call her MD in the morning.

## 2014-03-15 NOTE — Progress Notes (Signed)
    SUBJECTIVE:  No further chest pain.  She is complaining of some abdominal pain instead.   PHYSICAL EXAM Filed Vitals:   03/14/14 1800 03/14/14 2059 03/15/14 0441 03/15/14 1007  BP: 177/81 120/71 149/89 137/58  Pulse: 93 104 108 79  Temp: 99.5 F (37.5 C) 98.2 F (36.8 C) 98.6 F (37 C)   TempSrc: Oral Oral Oral   Resp: 18 18 18    Height:      Weight:      SpO2: 100% 99% 99%    General:  No distress Lungs:  Clear Heart:  Irregular Abdomen:  Positive bowel sounds, no rebound no guarding Extremities:  No edema   LABS: Lab Results  Component Value Date   TROPONINI <0.30 03/14/2014   Results for orders placed during the hospital encounter of 03/13/14 (from the past 24 hour(s))  TROPONIN I     Status: None   Collection Time    03/14/14  3:00 PM      Result Value Ref Range   Troponin I <0.30  <0.30 ng/mL  MAGNESIUM     Status: None   Collection Time    03/14/14  3:00 PM      Result Value Ref Range   Magnesium 2.1  1.5 - 2.5 mg/dL  VITAMIN [id number]     Status: None   Collection Time    03/14/14  3:00 PM      Result Value Ref Range   Vitamin B-12 388  211 - 911 pg/mL    Intake/Output Summary (Last 24 hours) at 03/15/14 1113 Last data filed at 03/15/14 0900  Gross per 24 hour  Intake    480 ml  Output      0 ml  Net    480 ml    ASSESSMENT AND PLAN:  CHEST PAIN:   Enzymes negative.    EF well preserved on echo.   No objective evidence of ischemia.  No further cardiac work up.    ATRIAL FIB:  Telemetry reviewed.  Rate OK.   Continue current meds.    Fayrene FearingJames Kimi Bordeau 03/15/2014 11:13 AM

## 2014-03-15 NOTE — Evaluation (Signed)
Clinical/Bedside Swallow Evaluation Patient Details  Name: Julie Rich MRN: [id number] Date of Birth: 1922/03/28  Today's Date: 03/15/2014 Time: [id number] SLP Time Calculation (min): 14 min  Past Medical History:  Past Medical History  Diagnosis Date  . HTN (hypertension)   . A-fib   . Dyslipidemia   . Hypothyroidism   . Stroke   . Asthma   . Shortness of breath   . CHF (congestive heart failure)   . GERD (gastroesophageal reflux disease)   . Anxiety   . COPD (chronic obstructive pulmonary disease)   . Dysrhythmia   . Arthritis    Past Surgical History:  Past Surgical History  Procedure Laterality Date  . Total abdominal hysterectomy    . Breast surgery      45-50 YEARS AGO LUMP REMOVED  . Appendectomy    . Cardioversion  06/26/2011    Procedure: CARDIOVERSION;  Surgeon: Marca Anconaalton McLean, MD;  [facility];  Service: Cardiovascular;  Laterality: N/A;  . Tee without cardioversion N/A 07/24/2012    Procedure: TRANSESOPHAGEAL ECHOCARDIOGRAM (TEE);  Surgeon: Pricilla RifflePaula V Ross, MD;  [facility];  Service: Cardiovascular;  Laterality: N/A;  . Cardioversion N/A 07/24/2012    Procedure: CARDIOVERSION;  Surgeon: Pricilla RifflePaula V Ross, MD;  [facility];  Service: Cardiovascular;  Laterality: N/A;  . Cardioversion N/A 09/14/2012    Procedure: CARDIOVERSION;  Surgeon: Pricilla RifflePaula V Ross, MD;  [facility];  Service: Cardiovascular;  Laterality: N/A;  . Tonsillectomy    . Eye surgery      removed cataract in both eyes 5 years ago  . Tubal ligation     HPI:  Julie Rich is a 80 y.o. female with history of atrial fibrillation, hypertension, COPD, hyperlipidemia, CHF started experiencing chest pain while she was having supper.  CP resolved; tachy when eating so swallow eval ordered.  Evaluated by SLP in Feb of this year with normal findings but recs for strategies during COPD exac. events.  Recent 10/18 Chest CT Emphysematous changes in the lungs. No evidence of active  pulmonary dz.     Assessment / Plan / Recommendation Clinical Impression  Pt presented with intermittent, baseline cough prior to and during swallow assessment.  There was adequate respiratory/swallow sequence with appropiate exhalation post-swallow; palpable/consistent swallow response.  Adequate mastication.  No overt signs of dysphagia; however, pt reports that pt has been coughing x2 weeks with PO consumption.  Recommend proceeding with MBS.      Aspiration Risk  Mild    Diet Recommendation  (pending MBS)        Other  Recommendations Recommended Consults: MBS   Follow Up Recommendations    pending mbs   Swallow Study Prior Functional Status       General HPI: Julie Rich is a 80 y.o. female with history of atrial fibrillation, hypertension, COPD, hyperlipidemia, CHF started experiencing chest pain while she was having supper.  CP resolved; tachy when eating so swallow eval ordered.  Evaluated by SLP in Feb of this year with normal findings but recs for strategies during COPD exac. events.  Recent 10/18 Chest CT Emphysematous changes in the lungs. No evidence of active pulmonary dz.   Type of Study: Bedside swallow evaluation Previous Swallow Assessment: Feb 2015 Diet Prior to this Study: Regular;Thin liquids Temperature Spikes Noted: No Respiratory Status: Nasal cannula History of Recent Intubation: No Behavior/Cognition: Alert;Cooperative Oral Cavity - Dentition: Adequate natural dentition Self-Feeding Abilities: Able to feed self Patient Positioning: Upright in bed Baseline  Vocal Quality: Clear Volitional Cough: Strong Volitional Swallow: Able to elicit    Oral/Motor/Sensory Function Overall Oral Motor/Sensory Function: Appears within functional limits for tasks assessed   Ice Chips Ice chips: Within functional limits   Thin Liquid Thin Liquid: Within functional limits (cough < 10% of swallows) Presentation: Cup;Self Fed    Nectar Thick Nectar Thick Liquid: Not tested    Honey Thick Honey Thick Liquid: Not tested   Puree Puree: Within functional limits   Solid   GO Functional Assessment Tool Used: clinical judgement Functional Limitations: Swallowing Swallow Current Status (Z6109(G8996): At least 1 percent but less than 20 percent impaired, limited or restricted Swallow Goal Status [phone](G8997): At least 1 percent but less than 20 percent impaired, limited or restricted  Solid: Within functional limits      Chanie Soucek L. Samson Fredericouture, MA CCC/SLP Pager [phone]  Blenda MountsCouture, Rashawn Rolon Laurice 03/15/2014,2:03 PM

## 2014-04-27 ENCOUNTER — Telehealth: Payer: Self-pay | Admitting: Family Medicine

## 2014-08-26 DEATH — deceased

## 2014-09-24 IMAGING — CR DG CHEST 1V PORT
2 series · 2 of 2 positions shown · non-contrast
Comparison: 5455 hours the same day and earlier.

CLINICAL DATA: 89-year-old female line placement.

PORTABLE CHEST - 1 VIEW

[AP (1 of 2)]
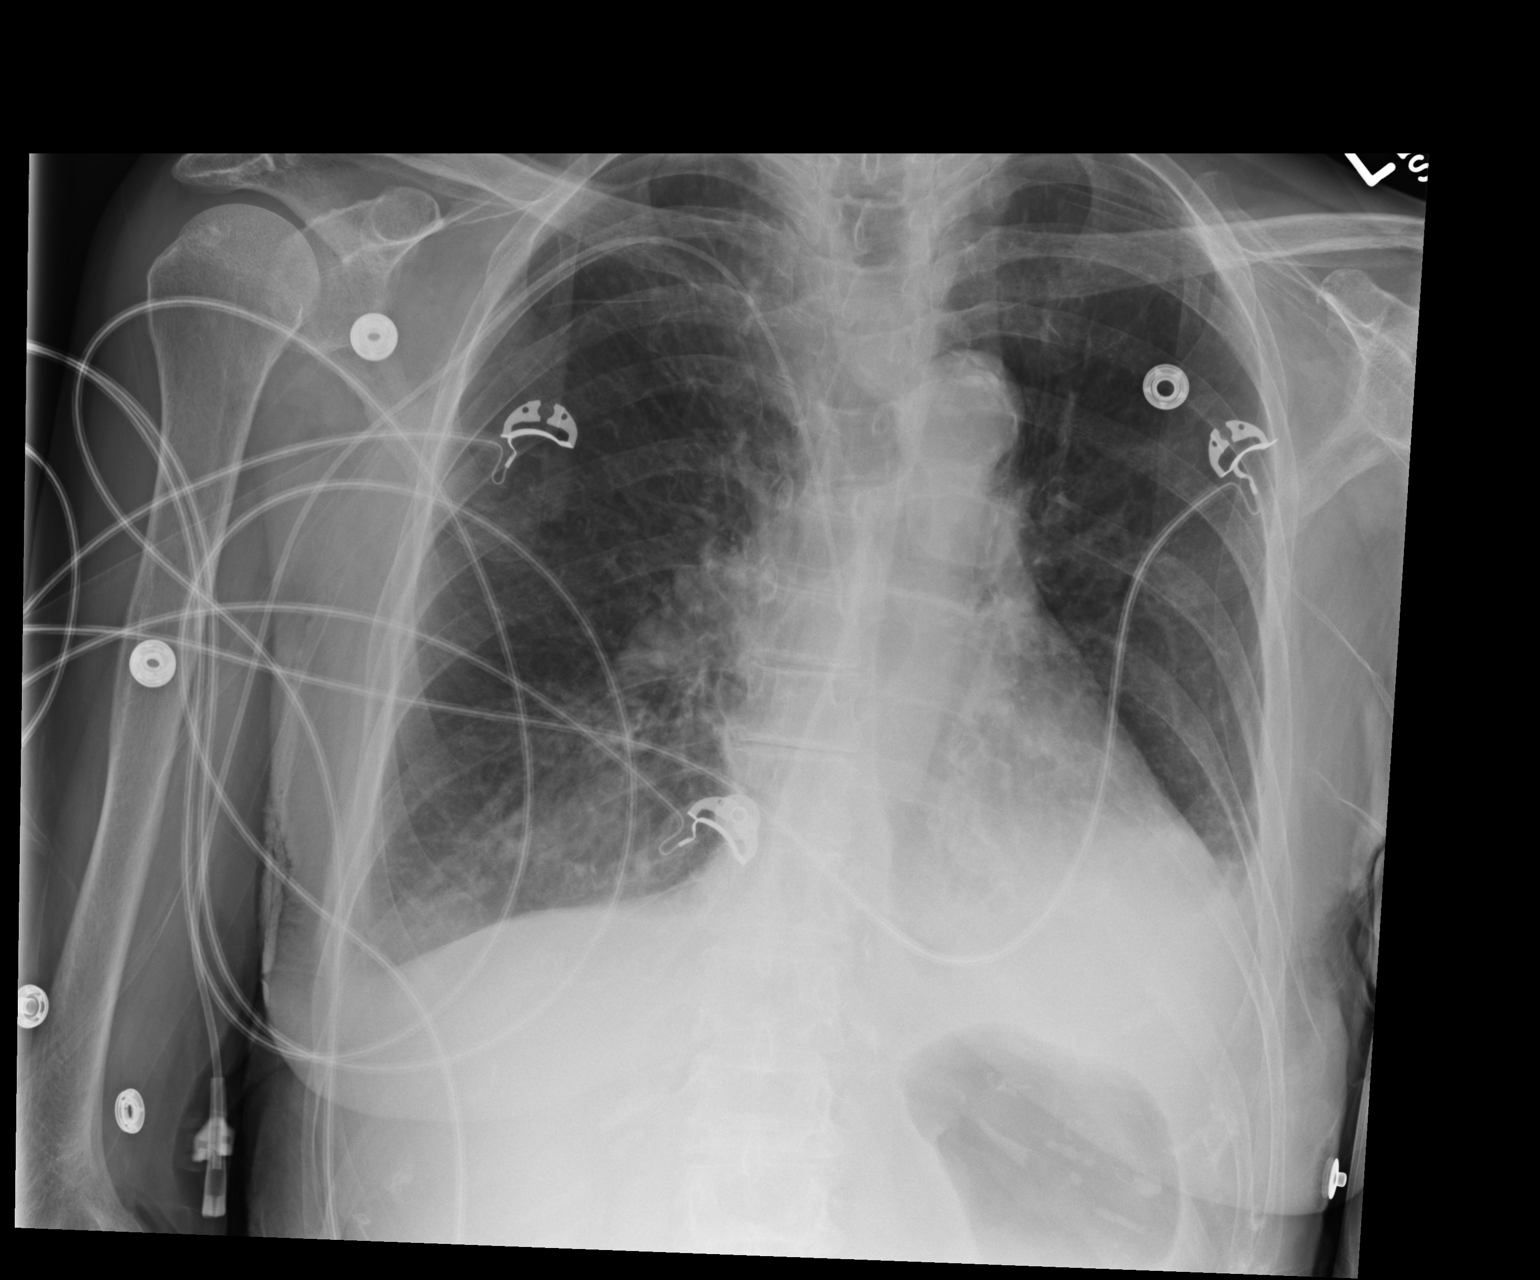

[AP (2 of 2)]
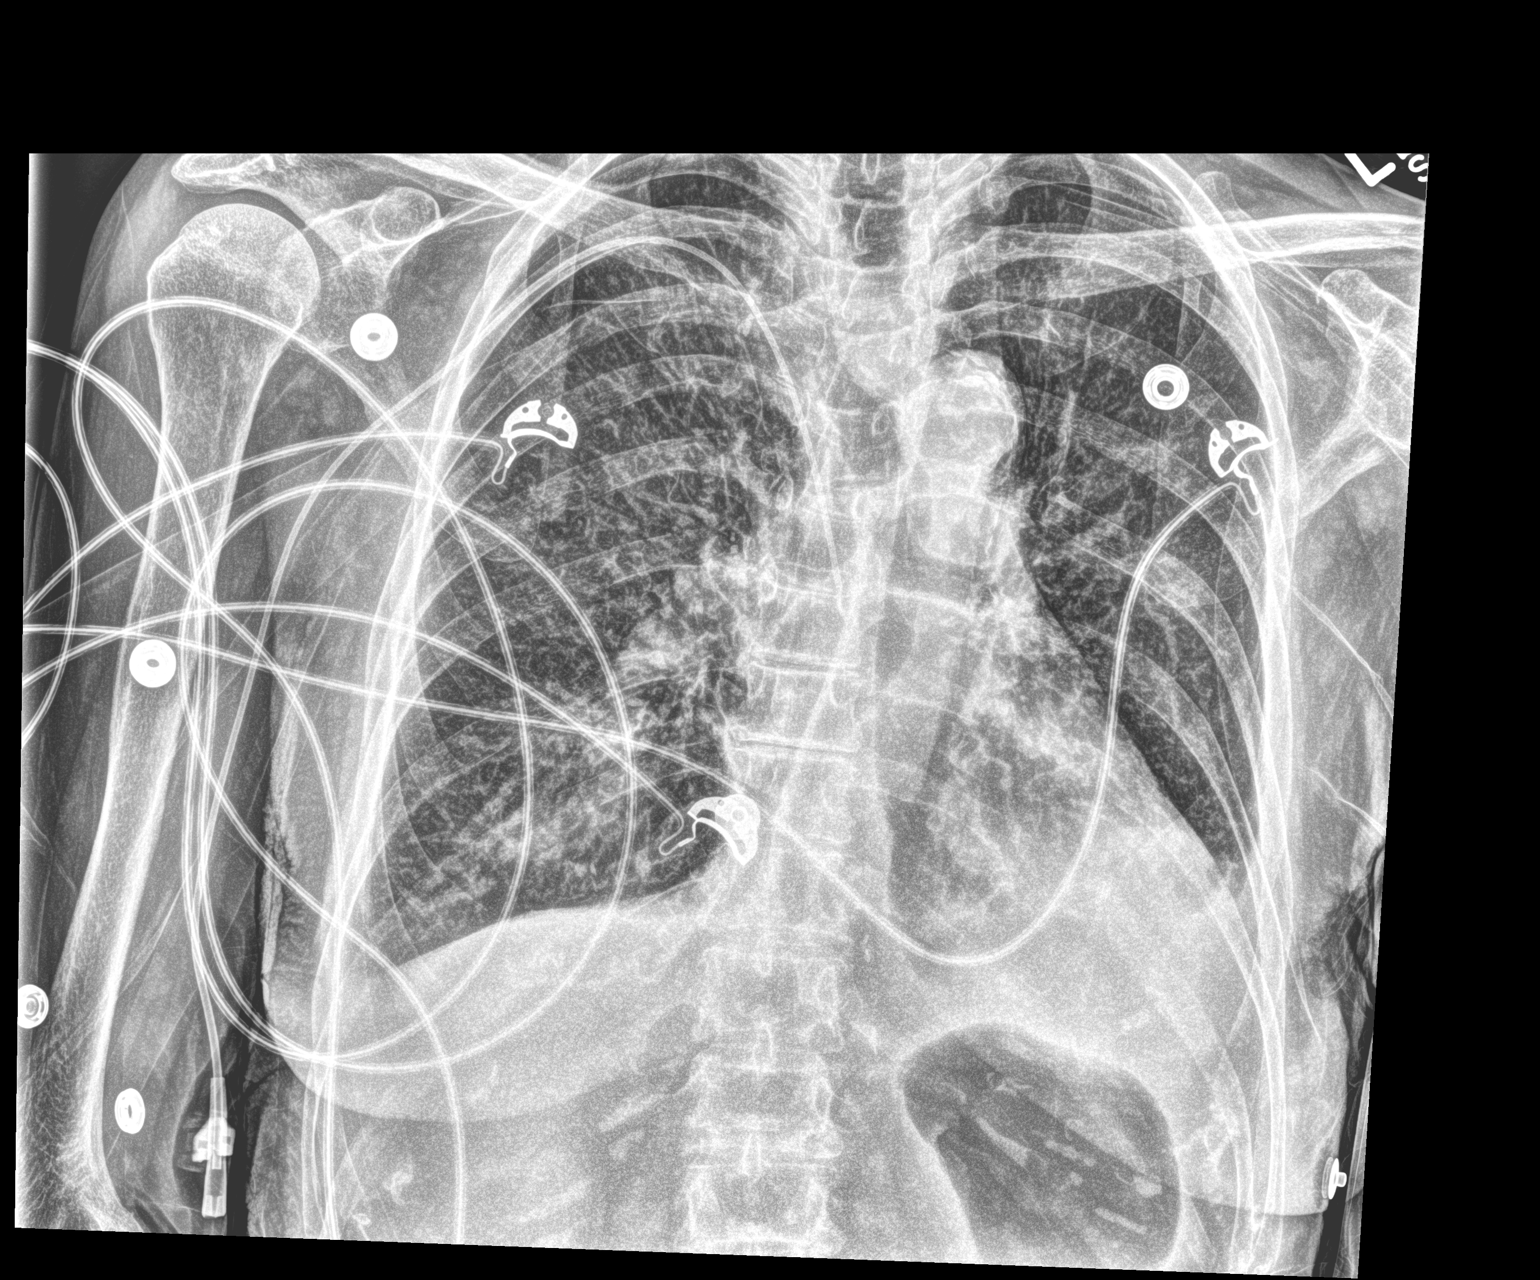

[2 of 2 positions shown; findings below may reference images not displayed]

FINDINGS: Semi upright AP portable view at 0545 hours.  Right PICC
line in place, tip at the lower SVC level.
Stable lung volumes.  Stable cardiac size and mediastinal contours.
No pneumothorax or pulmonary edema.  Small bilateral pleural
effusions.  Overall stable ventilation.
IMPRESSION: 1.  Right PICC line placed, tip at the lower SVC level.
2.  Otherwise stable chest.

## 2014-10-20 IMAGING — CR DG CHEST 2V
2 series · 2 of 2 positions shown · non-contrast
Comparison: None.

CLINICAL DATA: Cough, shortness of breath

CHEST - 2 VIEW

[view not recorded (1 of 2)]
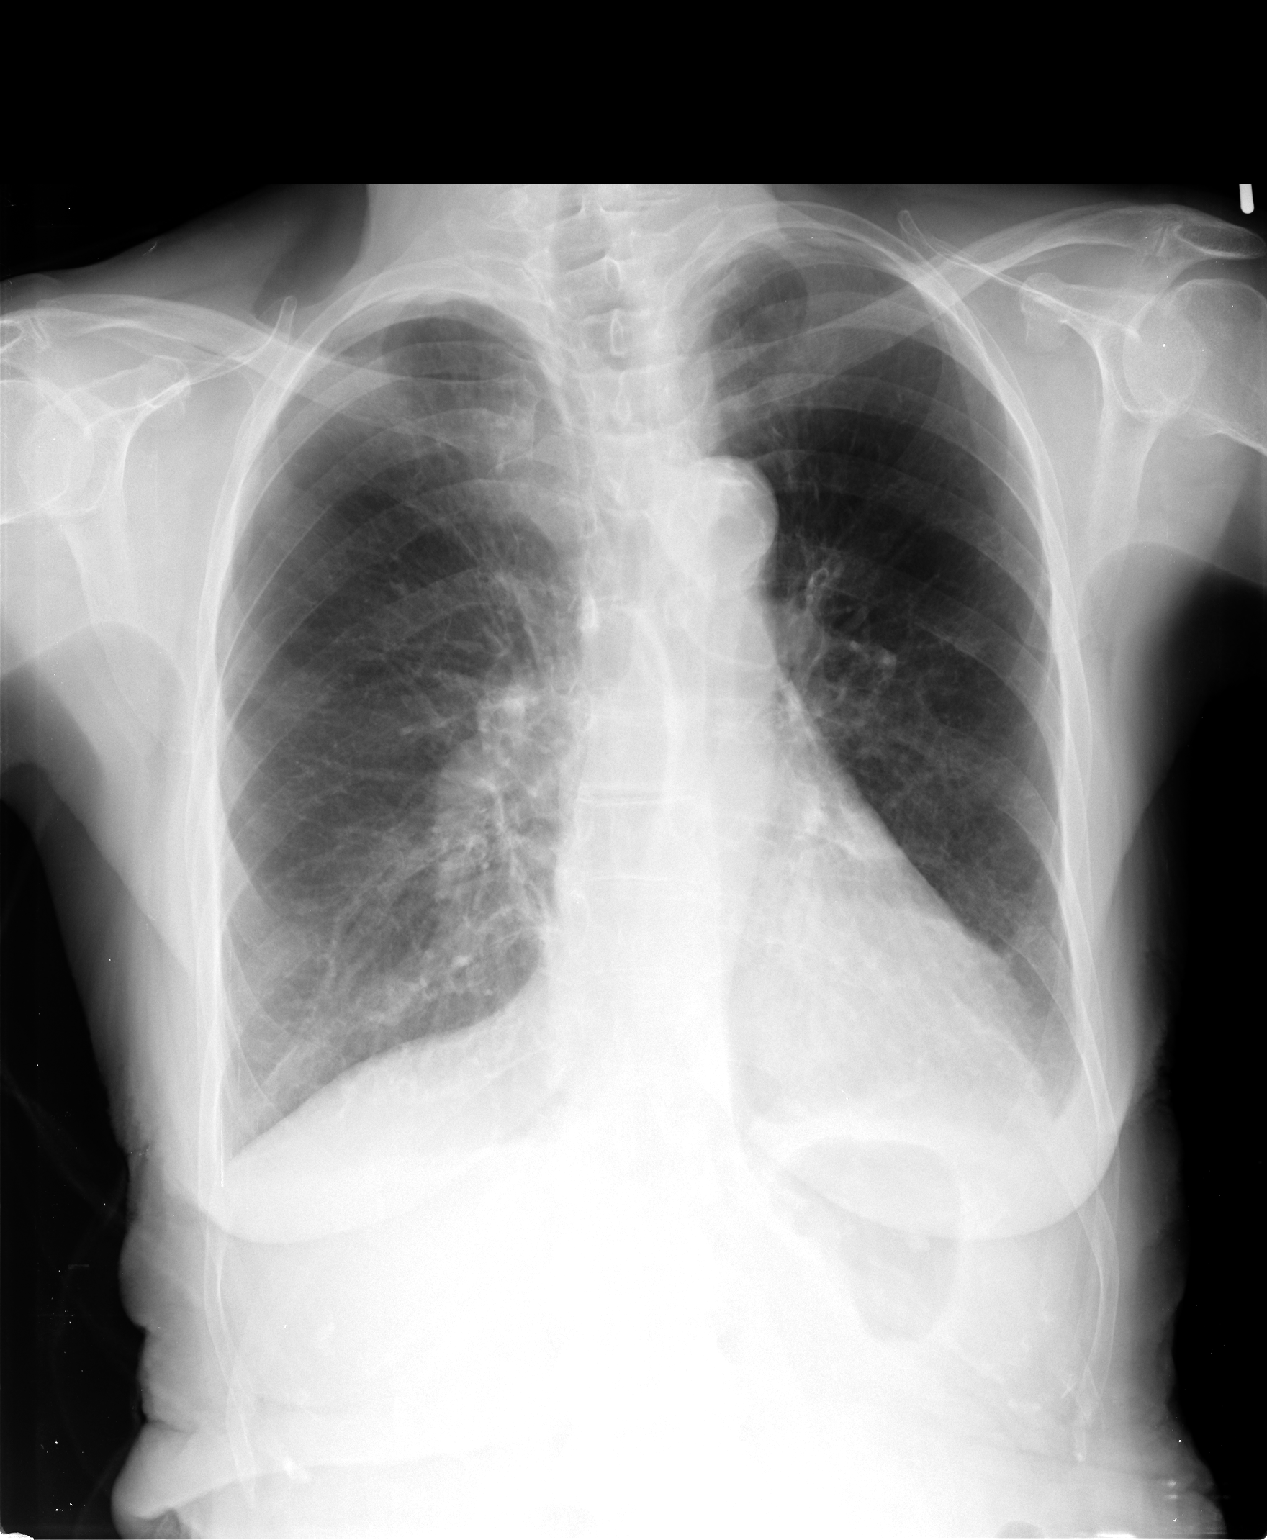

[view not recorded (2 of 2)]
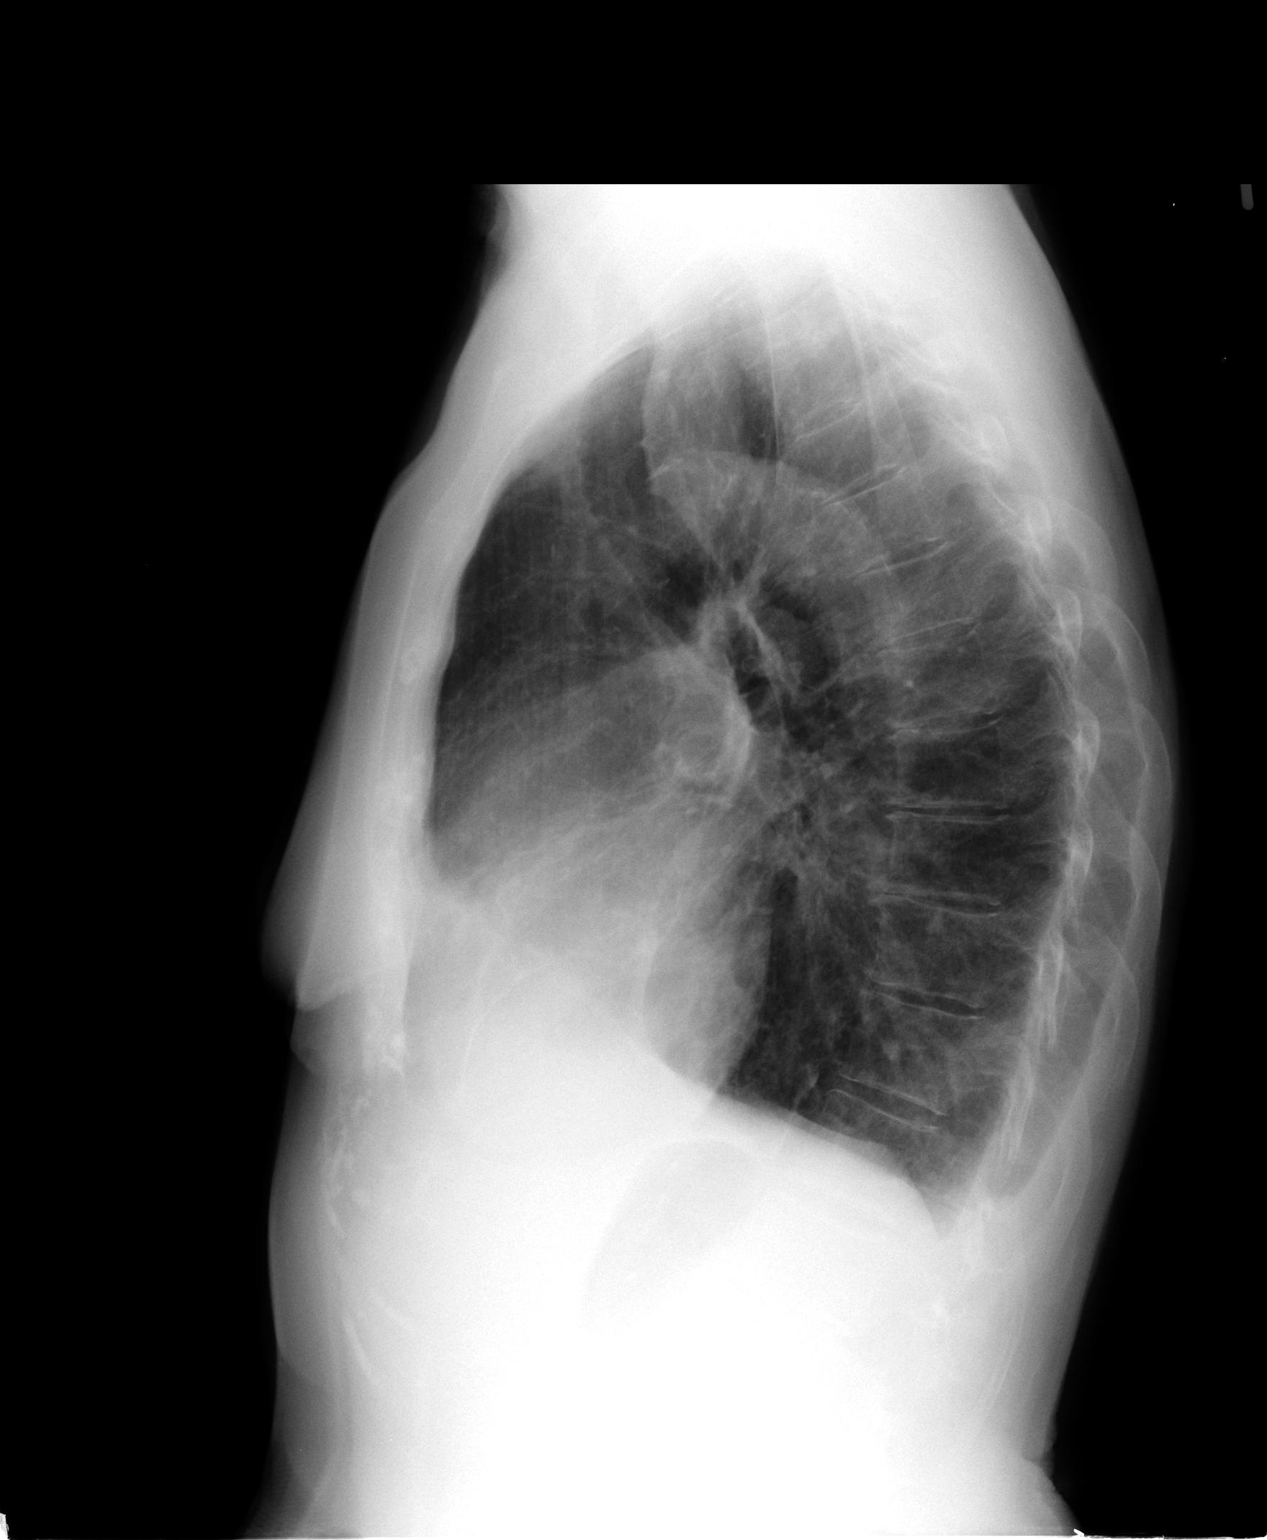

[2 of 2 positions shown; findings below may reference images not displayed]

FINDINGS: Chronic interstitial markings/emphysematous changes.  No
focal consolidation.  Blunting of the left costophrenic angle,
possibly reflecting a small pleural effusion or pleural thickening.
No pneumothorax.

The heart is normal in size.

Degenerative changes of the visualized thoracolumbar spine.
IMPRESSION: Possible small left pleural effusion or pleural thickening.

Chronic interstitial markings/emphysematous changes.
